# Patient Record
Sex: Male | Born: 1935
Health system: Southern US, Community
[De-identification: ages and names within clinical notes are randomized; demographics above are authoritative.]

## PROBLEM LIST (undated history)

## (undated) DIAGNOSIS — I219 Acute myocardial infarction, unspecified: Secondary | ICD-10-CM

## (undated) DIAGNOSIS — J189 Pneumonia, unspecified organism: Secondary | ICD-10-CM

## (undated) DIAGNOSIS — I10 Essential (primary) hypertension: Secondary | ICD-10-CM

## (undated) DIAGNOSIS — I499 Cardiac arrhythmia, unspecified: Secondary | ICD-10-CM

## (undated) DIAGNOSIS — H3530 Unspecified macular degeneration: Secondary | ICD-10-CM

## (undated) DIAGNOSIS — I2510 Atherosclerotic heart disease of native coronary artery without angina pectoris: Secondary | ICD-10-CM

## (undated) DIAGNOSIS — I509 Heart failure, unspecified: Secondary | ICD-10-CM

## (undated) DIAGNOSIS — H353232 Exudative age-related macular degeneration, bilateral, with inactive choroidal neovascularization: Secondary | ICD-10-CM

## (undated) DIAGNOSIS — R011 Cardiac murmur, unspecified: Secondary | ICD-10-CM

## (undated) DIAGNOSIS — E785 Hyperlipidemia, unspecified: Secondary | ICD-10-CM

## (undated) DIAGNOSIS — M1990 Unspecified osteoarthritis, unspecified site: Secondary | ICD-10-CM

## (undated) DIAGNOSIS — C801 Malignant (primary) neoplasm, unspecified: Secondary | ICD-10-CM

## (undated) DIAGNOSIS — G4733 Obstructive sleep apnea (adult) (pediatric): Secondary | ICD-10-CM

## (undated) HISTORY — DX: Cardiac murmur, unspecified: R01.1

## (undated) HISTORY — DX: Obstructive sleep apnea (adult) (pediatric): G47.33

## (undated) HISTORY — PX: PROSTATE BIOPSY: SHX241

## (undated) HISTORY — PX: CORONARY STENT INTERVENTION: CATH118234

## (undated) HISTORY — PX: OTHER SURGICAL HISTORY: SHX169

## (undated) HISTORY — DX: Unspecified macular degeneration: H35.30

## (undated) HISTORY — PX: HEMATOMA EVACUATION: SHX5118

## (undated) HISTORY — PX: HERNIA REPAIR: SHX51

## (undated) HISTORY — DX: Essential (primary) hypertension: I10

## (undated) HISTORY — PX: TONSILLECTOMY AND ADENOIDECTOMY: SUR1326

## (undated) HISTORY — DX: Hyperlipidemia, unspecified: E78.5

## (undated) HISTORY — DX: Cardiac arrhythmia, unspecified: I49.9

## (undated) HISTORY — DX: Heart failure, unspecified: I50.9

## (undated) HISTORY — DX: Atherosclerotic heart disease of native coronary artery without angina pectoris: I25.10

## (undated) HISTORY — PX: APPENDECTOMY: SHX54

---

## 2015-08-24 ENCOUNTER — Emergency Department (HOSPITAL_COMMUNITY): Payer: Medicare Other

## 2015-08-24 ENCOUNTER — Encounter (HOSPITAL_COMMUNITY): Payer: Self-pay | Admitting: Internal Medicine

## 2015-08-24 ENCOUNTER — Inpatient Hospital Stay (HOSPITAL_COMMUNITY)
Admission: EM | Admit: 2015-08-24 | Discharge: 2015-09-03 | DRG: 981 | Disposition: A | Discharge status: Home / Self Care | Payer: Medicare Other | Attending: Internal Medicine | Admitting: Internal Medicine

## 2015-08-24 DIAGNOSIS — R9431 Abnormal electrocardiogram [ECG] [EKG]: Secondary | ICD-10-CM | POA: Diagnosis not present

## 2015-08-24 DIAGNOSIS — R0902 Hypoxemia: Secondary | ICD-10-CM | POA: Insufficient documentation

## 2015-08-24 DIAGNOSIS — Z91010 Allergy to peanuts: Secondary | ICD-10-CM

## 2015-08-24 DIAGNOSIS — Z7982 Long term (current) use of aspirin: Secondary | ICD-10-CM

## 2015-08-24 DIAGNOSIS — I071 Rheumatic tricuspid insufficiency: Secondary | ICD-10-CM | POA: Diagnosis present

## 2015-08-24 DIAGNOSIS — Z888 Allergy status to other drugs, medicaments and biological substances status: Secondary | ICD-10-CM | POA: Diagnosis not present

## 2015-08-24 DIAGNOSIS — I4892 Unspecified atrial flutter: Secondary | ICD-10-CM | POA: Diagnosis present

## 2015-08-24 DIAGNOSIS — I447 Left bundle-branch block, unspecified: Secondary | ICD-10-CM | POA: Diagnosis present

## 2015-08-24 DIAGNOSIS — I272 Other secondary pulmonary hypertension: Secondary | ICD-10-CM | POA: Diagnosis present

## 2015-08-24 DIAGNOSIS — I491 Atrial premature depolarization: Secondary | ICD-10-CM | POA: Diagnosis present

## 2015-08-24 DIAGNOSIS — R7989 Other specified abnormal findings of blood chemistry: Secondary | ICD-10-CM

## 2015-08-24 DIAGNOSIS — R0600 Dyspnea, unspecified: Secondary | ICD-10-CM | POA: Diagnosis not present

## 2015-08-24 DIAGNOSIS — J13 Pneumonia due to Streptococcus pneumoniae: Principal | ICD-10-CM | POA: Diagnosis present

## 2015-08-24 DIAGNOSIS — Z7902 Long term (current) use of antithrombotics/antiplatelets: Secondary | ICD-10-CM

## 2015-08-24 DIAGNOSIS — I248 Other forms of acute ischemic heart disease: Secondary | ICD-10-CM | POA: Diagnosis not present

## 2015-08-24 DIAGNOSIS — J9601 Acute respiratory failure with hypoxia: Secondary | ICD-10-CM | POA: Diagnosis present

## 2015-08-24 DIAGNOSIS — I200 Unstable angina: Secondary | ICD-10-CM | POA: Diagnosis not present

## 2015-08-24 DIAGNOSIS — G4733 Obstructive sleep apnea (adult) (pediatric): Secondary | ICD-10-CM | POA: Diagnosis present

## 2015-08-24 DIAGNOSIS — J069 Acute upper respiratory infection, unspecified: Secondary | ICD-10-CM | POA: Diagnosis present

## 2015-08-24 DIAGNOSIS — I480 Paroxysmal atrial fibrillation: Secondary | ICD-10-CM | POA: Diagnosis present

## 2015-08-24 DIAGNOSIS — I429 Cardiomyopathy, unspecified: Secondary | ICD-10-CM | POA: Diagnosis present

## 2015-08-24 DIAGNOSIS — I499 Cardiac arrhythmia, unspecified: Secondary | ICD-10-CM | POA: Diagnosis not present

## 2015-08-24 DIAGNOSIS — Z7901 Long term (current) use of anticoagulants: Secondary | ICD-10-CM | POA: Diagnosis not present

## 2015-08-24 DIAGNOSIS — R778 Other specified abnormalities of plasma proteins: Secondary | ICD-10-CM

## 2015-08-24 DIAGNOSIS — I25110 Atherosclerotic heart disease of native coronary artery with unstable angina pectoris: Secondary | ICD-10-CM | POA: Diagnosis present

## 2015-08-24 DIAGNOSIS — I5041 Acute combined systolic (congestive) and diastolic (congestive) heart failure: Secondary | ICD-10-CM | POA: Diagnosis present

## 2015-08-24 DIAGNOSIS — Z882 Allergy status to sulfonamides status: Secondary | ICD-10-CM | POA: Diagnosis not present

## 2015-08-24 DIAGNOSIS — J189 Pneumonia, unspecified organism: Secondary | ICD-10-CM | POA: Diagnosis not present

## 2015-08-24 DIAGNOSIS — Z79899 Other long term (current) drug therapy: Secondary | ICD-10-CM | POA: Diagnosis not present

## 2015-08-24 DIAGNOSIS — I5032 Chronic diastolic (congestive) heart failure: Secondary | ICD-10-CM | POA: Diagnosis not present

## 2015-08-24 DIAGNOSIS — Z91018 Allergy to other foods: Secondary | ICD-10-CM | POA: Diagnosis not present

## 2015-08-24 DIAGNOSIS — I493 Ventricular premature depolarization: Secondary | ICD-10-CM | POA: Diagnosis not present

## 2015-08-24 DIAGNOSIS — E785 Hyperlipidemia, unspecified: Secondary | ICD-10-CM | POA: Diagnosis not present

## 2015-08-24 DIAGNOSIS — I471 Supraventricular tachycardia: Secondary | ICD-10-CM | POA: Diagnosis not present

## 2015-08-24 DIAGNOSIS — I252 Old myocardial infarction: Secondary | ICD-10-CM

## 2015-08-24 DIAGNOSIS — Z955 Presence of coronary angioplasty implant and graft: Secondary | ICD-10-CM | POA: Diagnosis not present

## 2015-08-24 DIAGNOSIS — E876 Hypokalemia: Secondary | ICD-10-CM | POA: Diagnosis not present

## 2015-08-24 DIAGNOSIS — I110 Hypertensive heart disease with heart failure: Secondary | ICD-10-CM | POA: Diagnosis present

## 2015-08-24 DIAGNOSIS — I214 Non-ST elevation (NSTEMI) myocardial infarction: Secondary | ICD-10-CM | POA: Diagnosis present

## 2015-08-24 DIAGNOSIS — R0602 Shortness of breath: Secondary | ICD-10-CM | POA: Diagnosis not present

## 2015-08-24 DIAGNOSIS — I2510 Atherosclerotic heart disease of native coronary artery without angina pectoris: Secondary | ICD-10-CM | POA: Diagnosis not present

## 2015-08-24 LAB — COMPREHENSIVE METABOLIC PANEL
ALT: 15 U/L — ABNORMAL LOW (ref 17–63)
AST: 26 U/L (ref 15–41)
Albumin: 3.5 g/dL (ref 3.5–5.0)
Alkaline Phosphatase: 35 U/L — ABNORMAL LOW (ref 38–126)
Anion gap: 10 (ref 5–15)
BILIRUBIN TOTAL: 1.7 mg/dL — AB (ref 0.3–1.2)
BUN: 25 mg/dL — AB (ref 6–20)
CO2: 24 mmol/L (ref 22–32)
Calcium: 9.6 mg/dL (ref 8.9–10.3)
Chloride: 103 mmol/L (ref 101–111)
Creatinine, Ser: 1.11 mg/dL (ref 0.61–1.24)
GFR calc Af Amer: >60 mL/min (ref >60)
Glucose, Bld: 147 mg/dL — ABNORMAL HIGH (ref 65–99)
POTASSIUM: 3.8 mmol/L (ref 3.5–5.1)
Sodium: 137 mmol/L (ref 135–145)
TOTAL PROTEIN: 6.7 g/dL (ref 6.5–8.1)

## 2015-08-24 LAB — BRAIN NATRIURETIC PEPTIDE: B NATRIURETIC PEPTIDE 5: 1070 pg/mL — AB (ref 0.0–100.0)

## 2015-08-24 LAB — CBC WITH DIFFERENTIAL/PLATELET
BASOS ABS: 0 10*3/uL (ref 0.0–0.1)
Basophils Relative: 0 %
EOS ABS: 0 10*3/uL (ref 0.0–0.7)
Eosinophils Relative: 0 %
HCT: 36.8 % — ABNORMAL LOW (ref 39.0–52.0)
Hemoglobin: 12.6 g/dL — ABNORMAL LOW (ref 13.0–17.0)
LYMPHS PCT: 5 %
Lymphs Abs: 0.7 10*3/uL (ref 0.7–4.0)
MCH: 31.8 pg (ref 26.0–34.0)
MCHC: 34.2 g/dL (ref 30.0–36.0)
MCV: 92.9 fL (ref 78.0–100.0)
Monocytes Absolute: 1.3 10*3/uL — ABNORMAL HIGH (ref 0.1–1.0)
Monocytes Relative: 8 %
Neutro Abs: 13.6 10*3/uL — ABNORMAL HIGH (ref 1.7–7.7)
Neutrophils Relative %: 87 %
PLATELETS: 402 10*3/uL — AB (ref 150–400)
RBC: 3.96 MIL/uL — AB (ref 4.22–5.81)
RDW: 12.8 % (ref 11.5–15.5)
WBC: 15.6 10*3/uL — AB (ref 4.0–10.5)

## 2015-08-24 LAB — I-STAT CG4 LACTIC ACID, ED: Lactic Acid, Venous: 1.78 mmol/L (ref 0.5–2.0)

## 2015-08-24 LAB — I-STAT TROPONIN, ED: TROPONIN I, POC: 1.06 ng/mL — AB (ref 0.00–0.08)

## 2015-08-24 MED ORDER — ACETAMINOPHEN 325 MG PO TABS
650.0000 mg | ORAL_TABLET | Freq: Once | ORAL | Status: AC
  Administered 2015-08-24: 650 mg via ORAL
  Filled 2015-08-24: qty 2

## 2015-08-24 MED ORDER — ACETAMINOPHEN 650 MG RE SUPP: 650.0000 mg | Freq: Four times a day (QID) | RECTAL | Status: DC | PRN

## 2015-08-24 MED ORDER — ASPIRIN EC 81 MG PO TBEC
81.0000 mg | DELAYED_RELEASE_TABLET | Freq: Every day | ORAL | Status: DC
  Administered 2015-08-25 – 2015-09-03 (×9): 81 mg via ORAL
  Filled 2015-08-24 (×10): qty 1

## 2015-08-24 MED ORDER — CEFTRIAXONE SODIUM 1 G IJ SOLR: 1.0000 g | INTRAMUSCULAR | Status: DC

## 2015-08-24 MED ORDER — SODIUM CHLORIDE 0.9 % IV BOLUS (SEPSIS)
500.0000 mL | Freq: Once | INTRAVENOUS | Status: AC
  Administered 2015-08-24: 500 mL via INTRAVENOUS

## 2015-08-24 MED ORDER — ACETAMINOPHEN 325 MG PO TABS: 650.0000 mg | ORAL_TABLET | Freq: Four times a day (QID) | ORAL | Status: DC | PRN

## 2015-08-24 MED ORDER — DEXTROSE 5 % IV SOLN
1.0000 g | INTRAVENOUS | Status: DC
  Administered 2015-08-24: 1 g via INTRAVENOUS
  Filled 2015-08-24: qty 10

## 2015-08-24 MED ORDER — DORZOLAMIDE HCL-TIMOLOL MAL 2-0.5 % OP SOLN
1.0000 [drp] | Freq: Two times a day (BID) | OPHTHALMIC | Status: DC
  Administered 2015-08-25 – 2015-09-02 (×18): 1 [drp] via OPHTHALMIC
  Filled 2015-08-24: qty 10

## 2015-08-24 MED ORDER — DEXTROSE 5 % IV SOLN
500.0000 mg | INTRAVENOUS | Status: DC
  Administered 2015-08-24: 500 mg via INTRAVENOUS
  Filled 2015-08-24: qty 500

## 2015-08-24 MED ORDER — SODIUM CHLORIDE 0.9 % IV BOLUS (SEPSIS)
1000.0000 mL | Freq: Once | INTRAVENOUS | Status: AC
  Administered 2015-08-24: 1000 mL via INTRAVENOUS

## 2015-08-24 MED ORDER — AZITHROMYCIN 500 MG PO TABS
500.0000 mg | ORAL_TABLET | ORAL | Status: DC
  Administered 2015-08-25 – 2015-08-28 (×4): 500 mg via ORAL
  Filled 2015-08-24 (×3): qty 2
  Filled 2015-08-24: qty 1

## 2015-08-24 MED ORDER — CARVEDILOL 12.5 MG PO TABS
12.5000 mg | ORAL_TABLET | Freq: Two times a day (BID) | ORAL | Status: DC
  Administered 2015-08-25 – 2015-08-30 (×11): 12.5 mg via ORAL
  Filled 2015-08-24 (×10): qty 1

## 2015-08-24 MED ORDER — ENOXAPARIN SODIUM 40 MG/0.4ML ~~LOC~~ SOLN: 40.0000 mg | SUBCUTANEOUS | Status: DC

## 2015-08-24 MED ORDER — DEXTROSE 5 % IV SOLN: 500.0000 mg | Freq: Once | INTRAVENOUS | Status: DC

## 2015-08-24 MED ORDER — DEXTROSE 5 % IV SOLN: 1.0000 g | Freq: Once | INTRAVENOUS | Status: DC

## 2015-08-24 MED ORDER — PRAVASTATIN SODIUM 40 MG PO TABS
40.0000 mg | ORAL_TABLET | Freq: Every day | ORAL | Status: DC
  Administered 2015-08-25 – 2015-08-30 (×6): 40 mg via ORAL
  Filled 2015-08-24 (×6): qty 1

## 2015-08-24 NOTE — ED Notes (Signed)
Pt placed back on non rebreather due to stats at 87% on 5L

## 2015-08-24 NOTE — ED Notes (Signed)
Pt taken off non rebreather and placed on 5L [place], stats at 93% pt tolerating well

## 2015-08-24 NOTE — ED Provider Notes (Signed)
CSN: [id number]     Arrival date & time 08/24/15  1855 History   First MD Initiated Contact with Patient 08/24/15 1909     Chief Complaint  Patient presents with  . Shortness of Breath     (Consider location/radiation/quality/duration/timing/severity/associated sxs/prior Treatment) HPI Comments: Patient is a 80 year old male with a cardiac history presenting with worsening shortness of breath, fever and cough. [place]. Saturday he started feeling ill which worsened on Sunday with fever up to 101 persisting today in addition to development of cough. The cough is nonproductive however patient does feel short of breath. Patient denies any lung history such as COPD or asthma. He is a nonsmoker. They were in the car for 4 hours [place] up from the beach but he denies any unilateral leg pain or swelling. Patient went to urgent care where he was found to have an oxygen saturation of 82% which only minimally improved with nasal cannula oxygen.  Patient denies any chest pain.  Patient is a 80 y.o. male presenting with shortness of breath. The history is provided by the patient and the spouse.  Shortness of Breath Severity:  Severe Onset quality:  Gradual Duration:  3 days Timing:  Constant Progression:  Worsening Chronicity:  New Context: URI   Relieved by:  Nothing Worsened by:  Nothing tried Ineffective treatments:  None tried Associated symptoms: cough, fever and wheezing   Associated symptoms: no abdominal pain, no chest pain and no vomiting   Risk factors: no hx of PE/DVT, no prolonged immobilization and no tobacco use     No past medical history on file. No past surgical history on file. No family history on file. Social History  Substance Use Topics  . Smoking status: Not on file  . Smokeless tobacco: Not on file  . Alcohol Use: Not on file    Review of Systems  Constitutional: Positive for fever.   Respiratory: Positive for cough, shortness of breath and wheezing.   Cardiovascular: Negative for chest pain.  Gastrointestinal: Negative for vomiting and abdominal pain.  All other systems reviewed and are negative.     Allergies  Review of patient's allergies indicates not on file.  Home Medications   Prior to Admission medications   Not on File   BP 144/70 mmHg  Pulse 88  Temp(Src) 99.2 F (37.3 C) (Oral)  Resp 20  Ht 5\' 9"  (1.753 m)  Wt 177 lb (80.287 kg)  BMI 26.13 kg/m2  SpO2 100% Physical Exam  Constitutional: He is oriented to person, place, and time. He appears well-developed and well-nourished. No distress.  HENT:  Head: Normocephalic and atraumatic.  Mouth/Throat: Oropharynx is clear and moist.  Eyes: Conjunctivae and EOM are normal. Pupils are equal, round, and reactive to light.  Neck: Normal range of motion. Neck supple.  Cardiovascular: Normal rate, regular rhythm and intact distal pulses.   No murmur heard. Pulmonary/Chest: Effort normal. Tachypnea noted. No respiratory distress. He has no wheezes. He has rhonchi. He has rales.  Abdominal: Soft. He exhibits no distension. There is no tenderness. There is no rebound and no guarding.  Musculoskeletal: Normal range of motion. He exhibits no edema or tenderness.  No lower extremity edema or tenderness  Neurological: He is alert and oriented to person, place, and time.  Skin: Skin is warm and dry. No rash noted. No erythema.  Psychiatric: He has a normal mood and affect. His behavior is  normal.  Nursing note and vitals reviewed.   ED Course  Procedures (including critical care time) Labs Review Labs Reviewed  CBC WITH DIFFERENTIAL/PLATELET - Abnormal; Notable for the following:    WBC 15.6 (*)    RBC 3.96 (*)    Hemoglobin 12.6 (*)    HCT 36.8 (*)    Platelets 402 (*)    Neutro Abs 13.6 (*)    Monocytes Absolute 1.3 (*)    All other components within normal limits  I-STAT TROPOININ, ED - Abnormal;  Notable for the following:    Troponin i, poc 1.06 (*)    All other components within normal limits  CULTURE, BLOOD (ROUTINE X 2)  CULTURE, BLOOD (ROUTINE X 2)  URINE CULTURE  COMPREHENSIVE METABOLIC PANEL  URINALYSIS, ROUTINE W REFLEX MICROSCOPIC (NOT AT [facility])  BRAIN NATRIURETIC PEPTIDE  I-STAT CG4 LACTIC ACID, ED    Imaging Review Dg Chest Port 1 View  08/24/2015  CLINICAL DATA:  Shortness of breath, hypoxia and fever. EXAM: PORTABLE CHEST 1 VIEW COMPARISON:  None. FINDINGS: The heart size and mediastinal contours are within normal limits. Dense infiltrates are seen in the right upper lobe and right lower lobe. There also is suspected to be a component of infiltrate in the left perihilar lung. Findings are consistent with bilateral pneumonia. There also may be mild superimposed interstitial edema without overt airspace edema or visible pleural effusions. No pneumothorax. The visualized skeletal structures are unremarkable. IMPRESSION: Evidence of bilateral pneumonia, right greater than left, and potentially superimposed mild interstitial edema. Electronically Signed   By: Irish Lack M.D.   On: 08/24/2015 19:58   I have personally reviewed and evaluated these images and lab results as part of my medical decision-making.   EKG Interpretation   Date/Time:  Monday Aug 24 2015 19:20:12 EDT Ventricular Rate:  87 PR Interval:  167 QRS Duration: 113 QT Interval:  380 QTC Calculation: 457 R Axis:   -9 Text Interpretation:  Sinus rhythm Probable left atrial enlargement  Incomplete left bundle branch block No previous tracing Confirmed by  Anitra Lauth  MD, Alphonzo Lemmings ([id number]) on 08/24/2015 7:22:59 PM      MDM   Final diagnoses:  CAP (community acquired pneumonia)  Hypoxia  Elevated troponin   Patient is a 80 year old male presenting today with fever, tachypnea, rhonchi and rales and hypoxia. Concern for pneumonia in code sepsis was initiated. Currently is mentating and on nonrebreather  oxygen saturation is 100%. He does have a known cardiac history but denies any chest pain today. He just has cardiologist 2 weeks ago and everything was normal.  Upon arrival on 6 L of oxygen patient was satting 89% and was changed to a nonrebreather with improvement. Temperature 90.9 0.201 at urgent care. Chest x-ray is consistent with bilateral pneumonia right greater than left and possible mild edema. Concern for the acute nature of patient's symptoms. He was started on antibiotics for community-acquired pneumonia.   Leukocytosis of 15,000, elevated troponin of 1. EKG with incomplete left bundle-branch block without old to compare. Renal function and lactic acid within normal limits.  Patient treated with Rocephin and azithromycin. Will be admitted to stepdown.   Gwyneth Sprout, MD 08/24/15 2116

## 2015-08-24 NOTE — H&P (Signed)
Date: 08/24/2015               Patient Name:  Ivan Mccoy MRN: [id number]  DOB: 12/31/1935 Age / Sex: 80 y.o., male   PCP: No primary care provider on file.         Medical Service: Internal Medicine Teaching Service         Attending Physician: Dr. Inez CatalinaEmily B Mullen, MD    First Contact: Dr. Karma GreaserBoswell Pager: [phone]  Second Contact: Dr. Tasia CatchingsAhmed Pager: [phone]       After Hours (After 5p/  First Contact Pager: [phone]  weekends / holidays): Second Contact Pager: [phone]   Chief Complaint: Shortness of breath  History of Present Illness: 80 y/o man with a history of HTN, paroxysmal Afib s/p cryoablation 2013, OSA presents to the ED after 3 days of worsening shortness of breath. This started Saturday with nausea and chills as the most prominent symptoms. This worsened and he has not tolerated eating or drinking much Sunday or today but denies any vomiting. His wife reports checking his temperature at home at 101.39F. He has also had a worsening nonproductive cough and today is persistently short of breath. He denies associated chest pain. He does not have other known sick contacts and has minimal upper airway symptoms. On arrival to the ED he was hypoxic that partially improved 90%s with O2 6L by [place], CXR was obtained showing bilateral R>L infiltrates and labs consistent with leukocytosis. IVNS, CTX, and azithromycin started.  [place] [facility] but was travelling for grandchildren's graduation. He was previously feeling at baseline health and saw his Cardiologist a month ago with no changes in treatment. He does not have any history of COPD or other lung disease that he is aware of. He has previously undergone echocardiography showing LVH and diastolic dysfunction. Previous stress tests done years ago were falsely positive twice with negative subsequent heart catheterizations.  Meds: Current  Facility-Administered Medications  Medication Dose Route Frequency Provider Last Rate Last Dose  . acetaminophen (TYLENOL) tablet 650 mg  650 mg Oral Q6H PRN Fuller Planhristopher W Idrees Quam, MD       Or  . acetaminophen (TYLENOL) suppository 650 mg  650 mg Rectal Q6H PRN Fuller Planhristopher W Morrisa Aldaba, MD      . Melene Muller[START ON 08/25/2015] aspirin EC tablet 81 mg  81 mg Oral Daily Fuller Planhristopher W Doneisha Ivey, MD      . Melene Muller[START ON 08/25/2015] azithromycin (ZITHROMAX) tablet 500 mg  500 mg Oral Q24H Fuller Planhristopher W Carrie Schoonmaker, MD      . Melene Muller[START ON 08/25/2015] carvedilol (COREG) tablet 12.5 mg  12.5 mg Oral BID WC Fuller Planhristopher W Akeel Reffner, MD      . Melene Muller[START ON 08/25/2015] cefTRIAXone (ROCEPHIN) 1 g in dextrose 5 % 50 mL IVPB  1 g Intravenous Q24H Fuller Planhristopher W Delroy Ordway, MD      . Melene Muller[START ON 08/25/2015] dorzolamide-timolol (COSOPT) 22.3-6.8 MG/ML ophthalmic solution 1 drop  1 drop Left Eye BID Fuller Planhristopher W Sedrick Tober, MD      . Melene Muller[START ON 08/25/2015] enoxaparin (LOVENOX) injection 40 mg  40 mg Subcutaneous Q24H Fuller Planhristopher W Liandra Mendia, MD      . Melene Muller[START ON 08/25/2015] pravastatin (PRAVACHOL) tablet 40 mg  40 mg Oral QHS Fuller Planhristopher W Mizraim Harmening, MD        Allergies: Allergies as of 08/24/2015 - Review Complete 08/24/2015  Allergen Reaction Noted  . Other Anaphylaxis 08/24/2015  . Peanut  oil Anaphylaxis 08/24/2015  . Peanut-containing drug products Anaphylaxis 08/24/2015  . Sulfa antibiotics Other (See Comments) 08/24/2015  . Eliquis [apixaban] Rash 08/24/2015  . Spironolactone Rash 08/24/2015   No past medical history on file. No past surgical history on file. Family History  Problem Relation Age of Onset  . Heart attack Father    Social History   Social History  . Marital Status: Married    Spouse Name: N/A  . Number of Children: N/A  . Years of Education: N/A   Occupational History  . Not on file.   Social History Main Topics  . Smoking status: Not on file  . Smokeless tobacco: Not on file  . Alcohol Use: Not on file  . Drug Use: Not on file  . Sexual  Activity: Not on file   Other Topics Concern  . Not on file   Social History Narrative  . No narrative on file    Review of Systems: Review of Systems  Constitutional: Positive for fever and chills.  HENT: Negative for congestion.   Eyes: Negative for blurred vision.  Respiratory: Positive for cough and shortness of breath. Negative for hemoptysis and sputum production.   Cardiovascular: Negative for chest pain.  Gastrointestinal: Positive for nausea. Negative for abdominal pain, diarrhea and constipation.  Genitourinary: Negative for dysuria.  Musculoskeletal: Negative for falls.  Skin: Negative for rash.  Neurological: Negative for dizziness.  Endo/Heme/Allergies: Does not bruise/bleed easily.  Psychiatric/Behavioral: The patient is not nervous/anxious.      Physical Exam: Blood pressure 101/67, pulse 85, temperature 98.6 F (37 C), temperature source Oral, resp. rate 25, height  (1.778 m), weight 82.2 kg (181 lb 3.5 oz), SpO2 94 %.   GENERAL- alert, co-operative, NAD HEENT- Oral mucosa appears moist, no cervical LN enlargement. CARDIAC- RRR, no murmurs, rubs or gallops. RESP- Crackles bilaterally extending up to apices and anteriorly, slightly diminished R base breath sounds, mildly increased WOB without retractions and speaking in short sentences ABDOMEN- Soft, nontender, no guarding or rebound NEURO- No obvious Cr N abnormality, strength upper and lower extremities- 5/5, Sensation intact globally EXTREMITIES- pulse 2+, symmetric, 1+ pedal edema b/l, chronic stasis changes over shins SKIN- Warm, dry, No rash or lesion. PSYCH- Normal mood and affect, appropriate thought content and speech.   Lab results: Basic Metabolic Panel:  Recent Labs  16/10/96 1941  NA 137  K 3.8  CL 103  CO2 24  GLUCOSE 147*  BUN 25*  CREATININE 1.11  CALCIUM 9.6   Liver Function Tests:  Recent Labs  08/24/15 1941  AST 26  ALT 15*  ALKPHOS 35*  BILITOT 1.7*  PROT 6.7    ALBUMIN 3.5   No results for input(s): LIPASE, AMYLASE in the last 72 hours. No results for input(s): AMMONIA in the last 72 hours. CBC:  Recent Labs  08/24/15 1941  WBC 15.6*  NEUTROABS 13.6*  HGB 12.6*  HCT 36.8*  MCV 92.9  PLT 402*   Cardiac Enzymes: No results for input(s): CKTOTAL, CKMB, CKMBINDEX, TROPONINI in the last 72 hours. BNP: No results for input(s): PROBNP in the last 72 hours. D-Dimer: No results for input(s): DDIMER in the last 72 hours. CBG: No results for input(s): GLUCAP in the last 72 hours. Hemoglobin A1C: No results for input(s): HGBA1C in the last 72 hours. Fasting Lipid Panel: No results for input(s): CHOL, HDL, LDLCALC, TRIG, CHOLHDL, LDLDIRECT in the last 72 hours. Thyroid Function Tests: No results for input(s): TSH, T4TOTAL, FREET4, T3FREE, THYROIDAB  in the last 72 hours. Anemia Panel: No results for input(s): VITAMINB12, FOLATE, FERRITIN, TIBC, IRON, RETICCTPCT in the last 72 hours. Coagulation: No results for input(s): LABPROT, INR in the last 72 hours. Urine Drug Screen: Drugs of Abuse  No results found for: LABOPIA, COCAINSCRNUR, LABBENZ, AMPHETMU, THCU, LABBARB  Alcohol Level: No results for input(s): ETH in the last 72 hours. Urinalysis: No results for input(s): COLORURINE, LABSPEC, PHURINE, GLUCOSEU, HGBUR, BILIRUBINUR, KETONESUR, PROTEINUR, UROBILINOGEN, NITRITE, LEUKOCYTESUR in the last 72 hours.  Invalid input(s): APPERANCEUR   Imaging results:  Dg Chest Port 1 View  08/24/2015  CLINICAL DATA:  Shortness of breath, hypoxia and fever. EXAM: PORTABLE CHEST 1 VIEW COMPARISON:  None. FINDINGS: The heart size and mediastinal contours are within normal limits. Dense infiltrates are seen in the right upper lobe and right lower lobe. There also is suspected to be a component of infiltrate in the left perihilar lung. Findings are consistent with bilateral pneumonia. There also may be mild superimposed interstitial edema without overt  airspace edema or visible pleural effusions. No pneumothorax. The visualized skeletal structures are unremarkable. IMPRESSION: Evidence of bilateral pneumonia, right greater than left, and potentially superimposed mild interstitial edema. Electronically Signed   By: Irish Lack M.D.   On: 08/24/2015 19:58    Other results: EKG: there are no previous tracings available for comparison, sinus rhythm, nonspecific ST and T waves changes, unclear conduction defect  Assessment & Plan by Problem: Community acquired pneumonia: R>L lobe infiltrates on CXR with fever, leukocytosis, worsening cough and SOB for 3 days. He has no known risk factors and no history of pulmonary disease. Denies any vomiting with his nausea so aspiration is not highly likely as a source. He is requiring substantial oxygen therapy 6L [place] or nonrebreather with increased WOB. -Admit to stepdown unit -Oxygen therapy as needed goal SpO2 >92% -Repeat AM Bmet, CBC -2view CXR in AM -Ceftriaxone 1g IV 5/15>>> -Azithromycin 500mg  5/15>>>  HFpEF: From review of cardi[facility] at [facility] appears diastolic dysfunction and LVH without significantly reduced EF. Previous stress studies consistently false positives with negative subsequent caths so likely NICM. However new troponin elevation and nonspecific EKG with his history is concerning. Also at risk for CHF with sepsis protocol IVF resuscitation. He is currently asymptomatic of any chest pain but dyspnea may be worsened with vascular congestion. -BNP -Trend trops -TTE in AM -Repeat EKG in AM -ASA 81mg  -Consider cardiology consult in AM -Continue home Coreg 12.5mg  BID -May increase from home lasix 20mg  if worsening edema and BP stable  HTN: On many agents PTA amlodipine 10mg , clonidine 0.1mg  TID, eplerenone 50mg , losartan 50mg . Will hold these initially for sepsis and fluid resuscitation but restart as indicated by BP.  HLD: Stable chronic problem. Continue home pravastatin  40mg , fenofibrate 160mg .  OSA: By chart and patient reports remarkably compliant at home. qHS CPAP.   FULL CODE Diet: Heart Healthy VTE ppx: [place] enoxaparin   Dispo: Disposition is deferred at this time, awaiting improvement of current medical problems. Anticipated discharge in approximately 2-4 day(s).   The patient does have a current PCP (No primary care provider on file.) and does not need an [facility] hospital follow-up appointment after discharge.  The patient does not have transportation limitations that hinder transportation to clinic appointments.  Signed: Fuller Plan, MD 08/24/2015, 11:48 PM

## 2015-08-24 NOTE — Progress Notes (Signed)
Pt arrived to 2C15 from ED at 2315.  Pt is al/o x4, NSR, on Venturi mask, no complaints of pain, afebrile, lots of coughing. RN will continue to monitor.

## 2015-08-24 NOTE — ED Notes (Signed)
Per  Dr. Anitra LauthPlunkett pt may take his normal home medications, that he brought with him except for him BP medications. Pt took his cholesterol and eye medication with this RN present, and withheld his BP medications.

## 2015-08-24 NOTE — ED Notes (Signed)
Pt reports generally nor feeling well for several days with nausea. Pt states that he has been chilled and running fevers of 101 at home. Pt was seen by his MD today and was noted to have spO2 in the 80's. Pt was placed on 6L here and sats increased to 88%.

## 2015-08-25 ENCOUNTER — Inpatient Hospital Stay (HOSPITAL_COMMUNITY): Payer: Medicare Other

## 2015-08-25 ENCOUNTER — Encounter (HOSPITAL_COMMUNITY): Payer: Self-pay | Admitting: *Deleted

## 2015-08-25 DIAGNOSIS — R0600 Dyspnea, unspecified: Secondary | ICD-10-CM

## 2015-08-25 DIAGNOSIS — R778 Other specified abnormalities of plasma proteins: Secondary | ICD-10-CM | POA: Insufficient documentation

## 2015-08-25 DIAGNOSIS — R7989 Other specified abnormal findings of blood chemistry: Secondary | ICD-10-CM

## 2015-08-25 DIAGNOSIS — R0902 Hypoxemia: Secondary | ICD-10-CM | POA: Insufficient documentation

## 2015-08-25 DIAGNOSIS — R0602 Shortness of breath: Secondary | ICD-10-CM

## 2015-08-25 LAB — DIFFERENTIAL
Basophils Absolute: 0 10*3/uL (ref 0.0–0.1)
Basophils Relative: 0 %
EOS ABS: 0 10*3/uL (ref 0.0–0.7)
EOS PCT: 0 %
LYMPHS ABS: 0.9 10*3/uL (ref 0.7–4.0)
LYMPHS PCT: 5 %
MONO ABS: 1.6 10*3/uL — AB (ref 0.1–1.0)
Monocytes Relative: 9 %
NEUTROS PCT: 86 %
Neutro Abs: 16.2 10*3/uL — ABNORMAL HIGH (ref 1.7–7.7)

## 2015-08-25 LAB — CBC
HEMATOCRIT: 38.8 % — AB (ref 39.0–52.0)
HEMOGLOBIN: 12.9 g/dL — AB (ref 13.0–17.0)
MCH: 31.1 pg (ref 26.0–34.0)
MCHC: 33.2 g/dL (ref 30.0–36.0)
MCV: 93.5 fL (ref 78.0–100.0)
Platelets: 394 10*3/uL (ref 150–400)
RBC: 4.15 MIL/uL — AB (ref 4.22–5.81)
RDW: 12.8 % (ref 11.5–15.5)
WBC: 17.8 10*3/uL — AB (ref 4.0–10.5)

## 2015-08-25 LAB — ECHOCARDIOGRAM COMPLETE
Height: 70 in
WEIGHTICAEL: 2899.49 [oz_av]

## 2015-08-25 LAB — BASIC METABOLIC PANEL
ANION GAP: 12 (ref 5–15)
BUN: 24 mg/dL — AB (ref 6–20)
CHLORIDE: 106 mmol/L (ref 101–111)
CO2: 23 mmol/L (ref 22–32)
Calcium: 9 mg/dL (ref 8.9–10.3)
Creatinine, Ser: 1.1 mg/dL (ref 0.61–1.24)
Glucose, Bld: 161 mg/dL — ABNORMAL HIGH (ref 65–99)
POTASSIUM: 3.7 mmol/L (ref 3.5–5.1)
SODIUM: 141 mmol/L (ref 135–145)

## 2015-08-25 LAB — URINE MICROSCOPIC-ADD ON
RBC / HPF: NONE SEEN RBC/hpf (ref 0–5)
WBC, UA: NONE SEEN WBC/hpf (ref 0–5)

## 2015-08-25 LAB — URINALYSIS, ROUTINE W REFLEX MICROSCOPIC
BILIRUBIN URINE: NEGATIVE
Glucose, UA: NEGATIVE mg/dL
Hgb urine dipstick: NEGATIVE
Ketones, ur: NEGATIVE mg/dL
Leukocytes, UA: NEGATIVE
NITRITE: NEGATIVE
PH: 5.5 (ref 5.0–8.0)
Protein, ur: 30 mg/dL — AB
SPECIFIC GRAVITY, URINE: 1.029 (ref 1.005–1.030)

## 2015-08-25 LAB — MRSA PCR SCREENING: MRSA by PCR: NEGATIVE

## 2015-08-25 LAB — TROPONIN I
TROPONIN I: 0.9 ng/mL — AB (ref <0.031)
TROPONIN I: 0.77 ng/mL — AB (ref <0.031)
Troponin I: 1.15 ng/mL (ref <0.031)

## 2015-08-25 LAB — HEPARIN LEVEL (UNFRACTIONATED)

## 2015-08-25 LAB — PROCALCITONIN: Procalcitonin: 0.45 ng/mL

## 2015-08-25 LAB — EXPECTORATED SPUTUM ASSESSMENT W REFEX TO RESP CULTURE: SPECIAL REQUESTS: NORMAL

## 2015-08-25 LAB — HIV ANTIBODY (ROUTINE TESTING W REFLEX): HIV Screen 4th Generation wRfx: NONREACTIVE

## 2015-08-25 LAB — STREP PNEUMONIAE URINARY ANTIGEN: STREP PNEUMO URINARY ANTIGEN: NEGATIVE

## 2015-08-25 IMAGING — DX DG CHEST 2V
2 series · 2 of 2 positions shown · non-contrast
Comparison: [DATE]

CLINICAL DATA: Shortness of breath since yesterday. Community
acquired pneumonia.

EXAM:
CHEST  2 VIEW

[x chest ap]
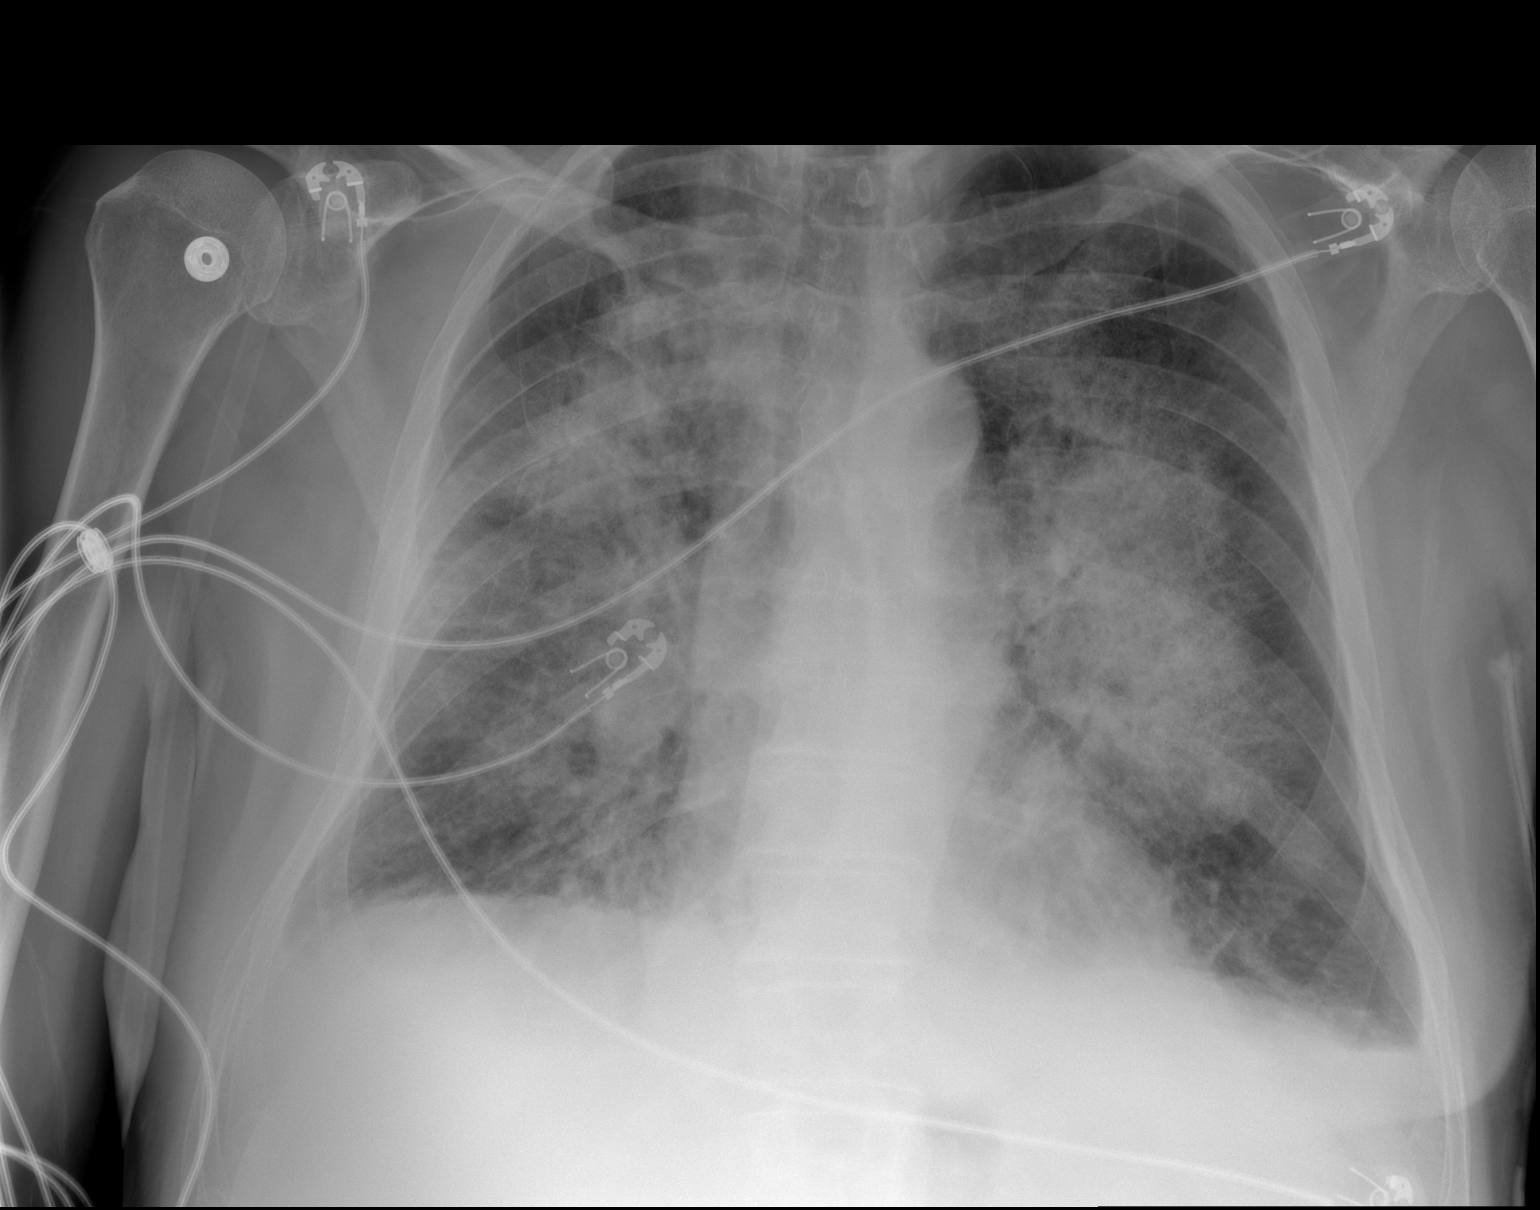

[w chest lat]
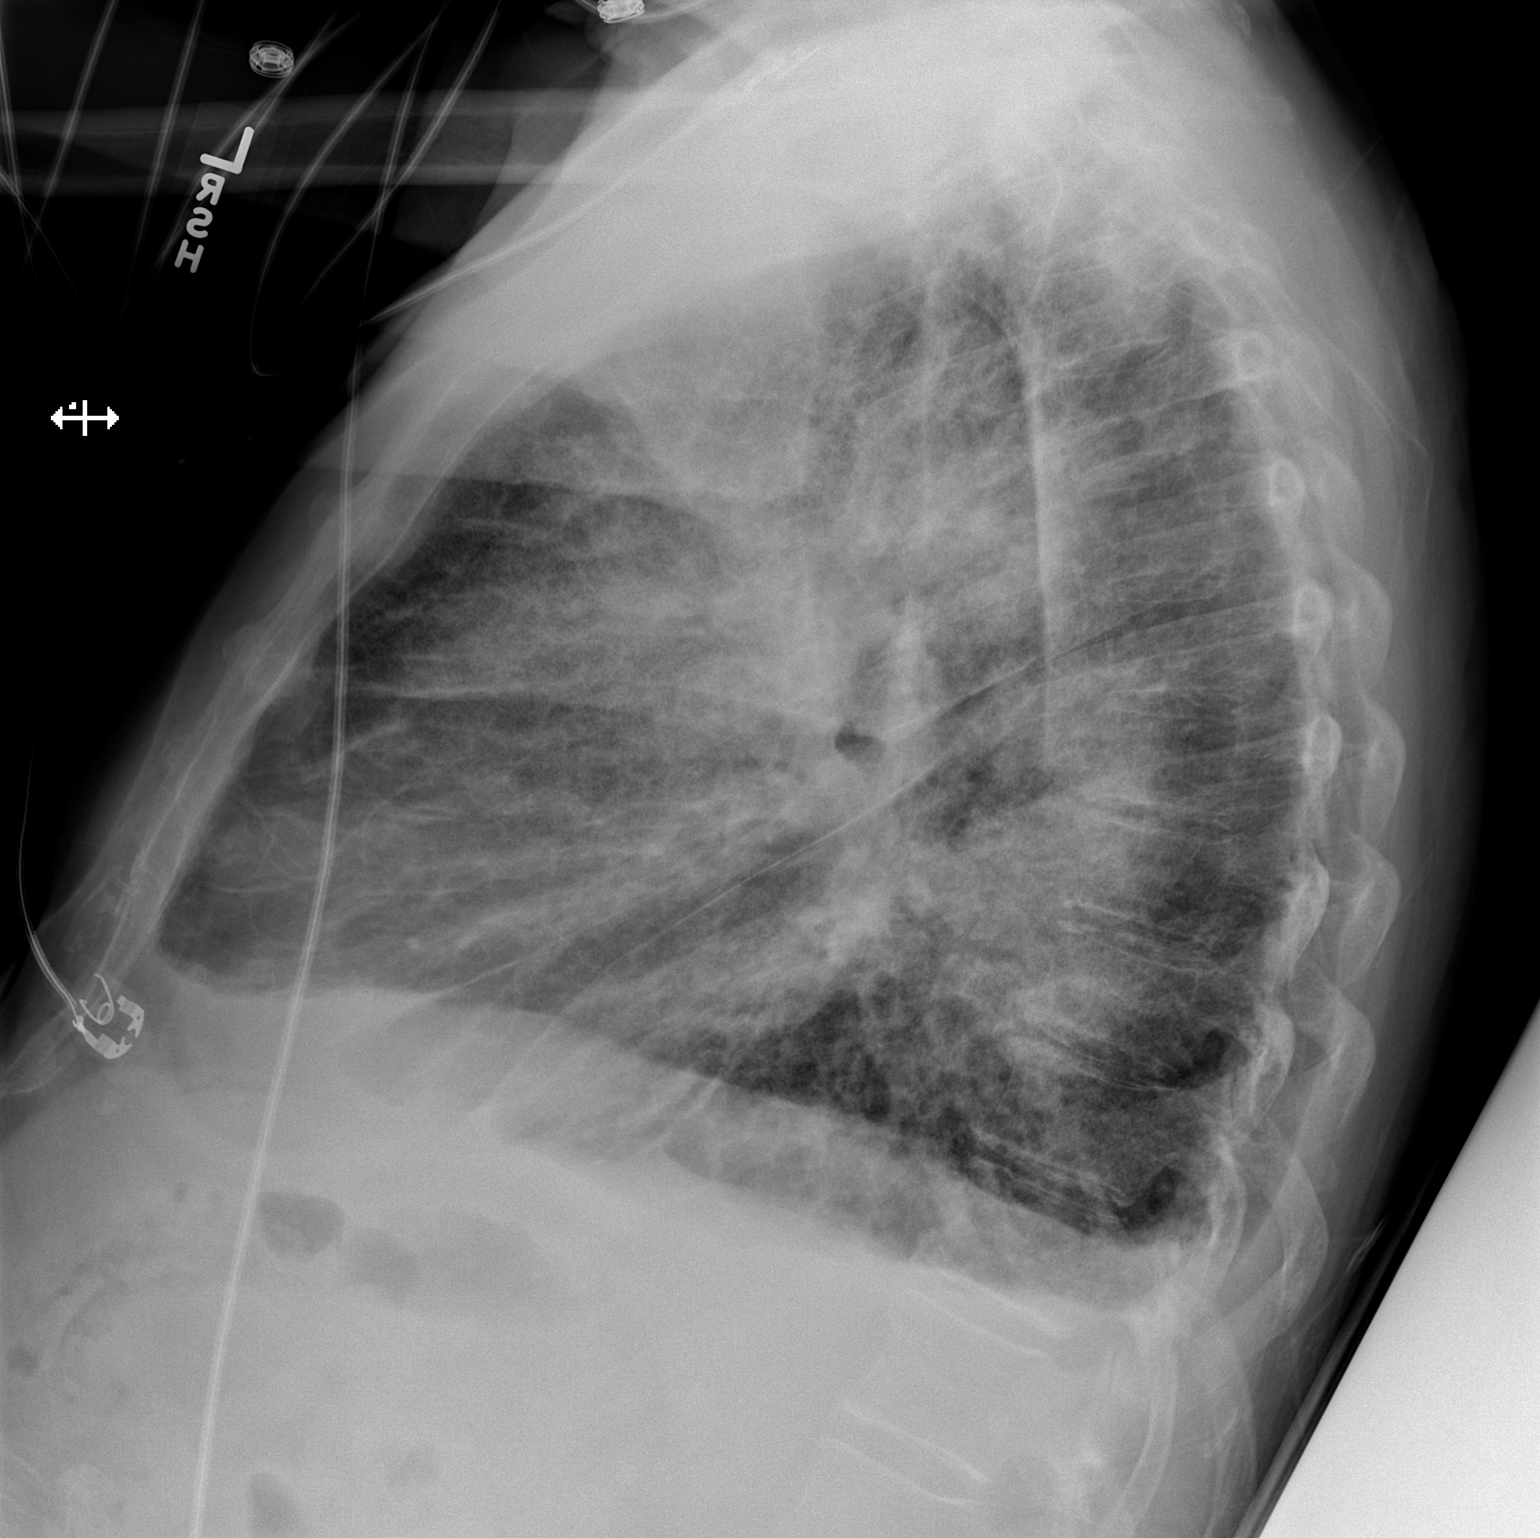

[2 of 2 positions shown; findings below may reference images not displayed]

FINDINGS: Allowing for lower lung volumes on the current exam, bilateral,
centrally predominant, airspace opacities are similar to the
previous day's exam. There are small pleural effusions. No
pneumothorax.

Cardiac silhouette is normal in size.
IMPRESSION: 1. Bilateral airspace opacities with small pleural effusions similar
to the previous day's exam. Findings are consistent with either
bilateral pneumonia or pulmonary edema.

## 2015-08-25 MED ORDER — HEPARIN BOLUS VIA INFUSION
4000.0000 [IU] | Freq: Once | INTRAVENOUS | Status: AC
  Administered 2015-08-25: 4000 [IU] via INTRAVENOUS
  Filled 2015-08-25: qty 4000

## 2015-08-25 MED ORDER — HEPARIN (PORCINE) IN NACL 100-0.45 UNIT/ML-% IJ SOLN
1450.0000 [IU]/h | INTRAMUSCULAR | Status: DC
  Administered 2015-08-25 (×2): 1000 [IU]/h via INTRAVENOUS
  Administered 2015-08-26: 1250 [IU]/h via INTRAVENOUS
  Filled 2015-08-25 (×2): qty 250

## 2015-08-25 MED ORDER — FUROSEMIDE 10 MG/ML IJ SOLN
80.0000 mg | Freq: Once | INTRAMUSCULAR | Status: AC
  Administered 2015-08-25: 80 mg via INTRAVENOUS
  Filled 2015-08-25: qty 8

## 2015-08-25 MED ORDER — TRAZODONE HCL 50 MG PO TABS
50.0000 mg | ORAL_TABLET | Freq: Every evening | ORAL | Status: DC | PRN
  Administered 2015-08-28: 50 mg via ORAL
  Filled 2015-08-25: qty 1

## 2015-08-25 MED ORDER — CEFTRIAXONE SODIUM 1 G IJ SOLR
1.0000 g | Freq: Once | INTRAMUSCULAR | Status: AC
  Administered 2015-08-25: 1 g via INTRAVENOUS
  Filled 2015-08-25: qty 10

## 2015-08-25 MED ORDER — HEPARIN BOLUS VIA INFUSION
2000.0000 [IU] | Freq: Once | INTRAVENOUS | Status: AC
  Administered 2015-08-25: 2000 [IU] via INTRAVENOUS
  Filled 2015-08-25: qty 2000

## 2015-08-25 MED ORDER — FUROSEMIDE 10 MG/ML IJ SOLN
40.0000 mg | Freq: Two times a day (BID) | INTRAMUSCULAR | Status: DC
  Administered 2015-08-25: 40 mg via INTRAVENOUS
  Filled 2015-08-25: qty 4

## 2015-08-25 MED ORDER — IPRATROPIUM-ALBUTEROL 0.5-2.5 (3) MG/3ML IN SOLN
3.0000 mL | Freq: Three times a day (TID) | RESPIRATORY_TRACT | Status: DC
  Administered 2015-08-25 – 2015-08-28 (×12): 3 mL via RESPIRATORY_TRACT
  Filled 2015-08-25 (×11): qty 3

## 2015-08-25 MED ORDER — DM-GUAIFENESIN ER 30-600 MG PO TB12
1.0000 | ORAL_TABLET | Freq: Two times a day (BID) | ORAL | Status: DC
  Administered 2015-08-25 – 2015-09-02 (×18): 1 via ORAL
  Filled 2015-08-25 (×7): qty 1
  Filled 2015-08-25: qty 2
  Filled 2015-08-25 (×10): qty 1

## 2015-08-25 MED ORDER — FUROSEMIDE 10 MG/ML IJ SOLN
40.0000 mg | Freq: Three times a day (TID) | INTRAMUSCULAR | Status: DC
  Administered 2015-08-25 (×2): 40 mg via INTRAVENOUS
  Filled 2015-08-25 (×2): qty 4

## 2015-08-25 MED ORDER — DEXTROSE 5 % IV SOLN
2.0000 g | INTRAVENOUS | Status: AC
  Administered 2015-08-25 – 2015-08-30 (×6): 2 g via INTRAVENOUS
  Filled 2015-08-25 (×7): qty 2

## 2015-08-25 MED ORDER — ZOLPIDEM TARTRATE 5 MG PO TABS
5.0000 mg | ORAL_TABLET | Freq: Once | ORAL | Status: DC
  Filled 2015-08-25: qty 1

## 2015-08-25 NOTE — Progress Notes (Signed)
CRITICAL VALUE ALERT  Critical value received:  Troponin 1.15  Date of notification:  08/25/15  Time of notification:  0405  Critical value read back: Yes  Nurse who received alert:  Rosamaria LintsKelsey Yari Szeliga, RN  MD notified (1st page):  Dr. Dimple Caseyice, IMTS  Time of first page:  0410  Responding MD:  Dr. Dimple Caseyice, Danise EdgeIMTS  Time MD responded:  [phone]

## 2015-08-25 NOTE — Progress Notes (Signed)
Subjective: No events overnight. Patient reports that he was unable to sleep last night due to worsening shortness of breath when lying back. He says he had to lean forward in a tripod position to help with his breathing overnight. Reports cough with scant sputum production. Denies any chest pain, palpations. Does report nausea with coughing but no emesis.   Says he saw his cardiologist a month ago and was told he had no problems and to follow up in a year. Also reports seeing his PCP recently with lab work and had no problems.   Objective: Vital signs in last 24 hours: Filed Vitals:   08/25/15 0852 08/25/15 0957 08/25/15 1221 08/25/15 1343  BP:  136/66 107/83   Pulse:  87 84   Temp:   98.5 F (36.9 C)   TempSrc:   Oral   Resp:  20 18   Height:      Weight:      SpO2: 96% 95% 96% 100%   Weight change:   Intake/Output Summary (Last 24 hours) at 08/25/15 1613 Last data filed at 08/25/15 1138  Gross per 24 hour  Intake   2800 ml  Output   1480 ml  Net   1320 ml    General: alert, well-developed, and cooperative to examination.  Neck: supple, full ROM, no carotid bruits. Patient unable to lie flat to assess for JVD. Lungs: normal respiratory effort, no accessory muscle use, crackles to mid lung fields, no wheezing Heart: normal rate, regular rhythm, no murmur, no gallop, and no rub.  Abdomen: soft, non-tender, normal bowel sounds, no distentio Pulses: 2+ DP/PT pulses bilaterally Extremities: No cyanosis, clubbing, 1+ pedal edema bilaterally Neurologic: alert & oriented X3, no focal deficits Skin:dry skin  Psych: normal mood and affect   Medications: I have reviewed the patient's current medications. Scheduled Meds: . aspirin EC  81 mg Oral Daily  . azithromycin  500 mg Oral Q24H  . carvedilol  12.5 mg Oral BID WC  . cefTRIAXone (ROCEPHIN)  IV  2 g Intravenous Q24H  . dextromethorphan-guaiFENesin  1 tablet Oral BID  . dorzolamide-timolol  1 drop Left Eye BID  .  furosemide  40 mg Intravenous TID  . ipratropium-albuterol  3 mL Nebulization TID  . pravastatin  40 mg Oral QHS  . zolpidem  5 mg Oral Once   Continuous Infusions: . heparin 1,000 Units/hr (08/25/15 1004)   PRN Meds:.acetaminophen **OR** acetaminophen Assessment/Plan: Acute SOB: CAP vs Acute CHF. R>L lobe infiltrates on CXR with fever, leukocytosis, worsening cough and SOB for 3 days. He has no known risk factors and no history of pulmonary disease. Denies any vomiting with his nausea so aspiration is not highly likely as a source. He is requiring substantial oxygen therapy 6L [place] on non-rebreather. No increased work of breathing this morning. His respiratory symptoms were worse after receiving 3L in the ED. Suspect possibly viral pneumonia given the bilateral infiltrates and week prodrome prior to admission though this could be secondary to silent MI with elevated troponin and possible new EKG changes. PCT was equivocal. Will continue to treat for CAP. Troponin peaked at 1.15. Will get repeat EKG today and try to obtain records from PCP/Cardiologist.  -Oxygen therapy as needed goal SpO2 >92%, currently on NRB -Repeat AM Bmet, CBC -2view CXR in AM -Ceftriaxone 1g IV 5/15>>> -Azithromycin  5/15>>> -Repeat EKG -Cardiology consulted, appreciate the consult -ECHO pending -Lasix 40 mg IV TID -Nebs tid -Mucinex -heparin gtt   HFpEF: [facility]  [facility] appears diastolic dysfunction and LVH without significantly reduced EF. Previous stress studies consistently false positives with negative subsequent caths so likely NICM. However new troponin elevation and nonspecific EKG with his history is concerning. Also at risk for CHF with sepsis protocol IVF resuscitation. He is currently asymptomatic of any chest pain but dyspnea may be worsened with vascular congestion. BNP elevated at 1100. Troponin peaked at 1.15 overnight.  -TTE pending -Repeat EKG -ASA  81mg  -cardiology consulted, appreciate the consult -Continue home Coreg 12.5mg  BID -Lasix 40 mg IV TID  HTN: On many agents PTA amlodipine 10mg , clonidine 0.1mg  TID, eplerenone 50mg , losartan 50mg . Will hold these. BP has been labile 107-148 systolic.   HLD: Stable chronic problem. - Continue home pravastatin 40mg , fenofibrate 160mg .  OSA:. qHS CPAP.  FULL CODE  Diet: Heart Healthy  VTE ppx: [place] enoxaparin  Dispo: Disposition is deferred at this time, awaiting improvement of current medical problems.    The patient does have a current PCP (No primary care provider on file.) and [facility] after discharge.  The patient does not have transportation limitations that hinder transportation to clinic appointments.  .Services Needed at time of discharge: Y = Yes, Blank = No PT:   OT:   RN:   Equipment:   Other:     LOS: 1 day   Ivan NoseNathan Tamsen Reist, MD IMTS PGY-1 [phone] 08/25/2015, 4:13 PM

## 2015-08-25 NOTE — Progress Notes (Signed)
Subjective: [name] was seen and examined during morning rounds.  He complains of shortness of breath that gets better when leaning over and is improved with oxygen.  He also complains of a productive cough, no hemoptysis.  Continues to have nausea associated with coughing spells.  Symptoms are not much improved from yesterday.  Denies CP, dizziness, vomiting.  He states he had a "complete heart and blood work" workup last month at [facility] and was told there were no issues with his heart.   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  Wife will work on getting that EKG faxed to us.     Objective: Vital signs in last 24 hours: Filed Vitals:   08/25/15 0500 08/25/15 0600 08/25/15 0847 08/25/15 0852  BP: 130/58 148/68    Pulse: 89 90 94   Temp:   97.9 F (36.6 C)   TempSrc:   Axillary   Resp: 17 24 20    Height:      Weight:      SpO2: 96% 99% 96% 96%   Weight change:   Intake/Output Summary (Last 24 hours) at 08/25/15 1026 Last data filed at 08/25/15 1022  Gross per 24 hour  Intake   2800 ml  Output   1280 ml  Net   1520 ml   General: Awake, conversant.  O2 mas in place.  Lungs: Faint crackles bilaterally.  No egophony.  Heart: RRR.  No murmurs.  No JVD  Abdomen, soft, nontender  Extremities: Warm, dry.   Lab Results: CBC    Component Value Date/Time   WBC 17.8* 08/25/2015 0250   RBC 4.15* 08/25/2015 0250   HGB 12.9* 08/25/2015 0250   HCT 38.8* 08/25/2015 0250   PLT 394 08/25/2015 0250   MCV 93.5 08/25/2015 0250   MCH 31.1 08/25/2015 0250   MCHC 33.2 08/25/2015 0250   RDW 12.8 08/25/2015 0250   LYMPHSABS 0.7 08/24/2015 1941   MONOABS 1.3* 08/24/2015 1941   EOSABS 0.0 08/24/2015 1941   BASOSABS 0.0 08/24/2015 1941    BMP Latest Ref Rng 08/25/2015 08/24/2015  Glucose 65 - 99 mg/dL 409(W161(H) 119(J147(H)  BUN 6 - 20 mg/dL 47(W24(H) 29(F25(H)  Creatinine 0.61 - 1.24 mg/dL 6.211.10 3.081.11  Sodium 657135 - 145  mmol/L 141 137  Potassium 3.5 - 5.1 mmol/L 3.7 3.8  Chloride 101 - 111 mmol/L 106 103  CO2 22 - 32 mmol/L 23 24  Calcium 8.9 - 10.3 mg/dL 9.0 9.6   Troponin: 8.461.06, 1.15, 0.9  BNP: 1070 (high)  UA: hyaline casts  S. pneumo urine antigen: negative Procalcitonin: 0.45 (normal)   Micro Results: Recent Results (from the past 240 hour(s))  MRSA PCR Screening     Status: None   Collection Time: 08/24/15 11:28 PM  Result Value Ref Range Status   MRSA by PCR NEGATIVE NEGATIVE Final    Comment:        The GeneXpert MRSA Assay (FDA approved for NASAL specimens only), is one component of a comprehensive MRSA colonization surveillance program. It is not intended to diagnose MRSA infection nor to guide or monitor treatment for MRSA infections.    Studies/Results: X-ray Chest Pa And Lateral  08/25/2015  CLINICAL DATA:  Shortness of breath since yesterday. Community acquired pneumonia. EXAM: CHEST  2 VIEW COMPARISON:  08/24/2015 FINDINGS: Allowing for lower lung volumes on the current exam, bilateral, centrally predominant, airspace opacities are similar to  the previous day's exam. There are small pleural effusions. No pneumothorax. Cardiac silhouette is normal in size. IMPRESSION: 1. Bilateral airspace opacities with small pleural effusions similar to the previous day's exam. Findings are consistent with either bilateral pneumonia or pulmonary edema. Electronically Signed   By: Amie Portland M.D.   On: 08/25/2015 08:07   Dg Chest Port 1 View  08/24/2015  CLINICAL DATA:  Shortness of breath, hypoxia and fever. EXAM: PORTABLE CHEST 1 VIEW COMPARISON:  None. FINDINGS: The heart size and mediastinal contours are within normal limits. Dense infiltrates are seen in the right upper lobe and right lower lobe. There also is suspected to be a component of infiltrate in the left perihilar lung. Findings are consistent with bilateral pneumonia. There also may be mild superimposed interstitial edema without  overt airspace edema or visible pleural effusions. No pneumothorax. The visualized skeletal structures are unremarkable. IMPRESSION: Evidence of bilateral pneumonia, right greater than left, and potentially superimposed mild interstitial edema. Electronically Signed   By: Irish Lack M.D.   On: 08/24/2015 19:58   Medications: I have reviewed the patient's current medications. Scheduled Meds: . aspirin EC  81 mg Oral Daily  . azithromycin  500 mg Oral Q24H  . carvedilol  12.5 mg Oral BID WC  . cefTRIAXone (ROCEPHIN)  IV  2 g Intravenous Q24H  . dextromethorphan-guaiFENesin  1 tablet Oral BID  . dorzolamide-timolol  1 drop Left Eye BID  . furosemide  40 mg Intravenous BID  . ipratropium-albuterol  3 mL Nebulization TID  . pravastatin  40 mg Oral QHS  . zolpidem  5 mg Oral Once   Continuous Infusions: . heparin 1,000 Units/hr (08/25/15 1004)   PRN Meds:.acetaminophen **OR** acetaminophen   Assessment/Plan: Shortness of breath Likely CAP given prodrome of nausea, vomiting, and progressive cough.  Also has leukocytosis and CXR shows bilateral infiltrates.  However, also concerned that pt is post-MI given his history of diastolic heart dysfunction and chest feeling "funny" last week.  Troponins are elevated, which may also support a post-MI diagnosis.  His last cath was over 20 years ago and was normal.  May also have pulmonary congestion from IVF load received in ED or first presentation of CHF.  Will consult cardiology regarding symptoms to differentiate CAP vs. Heart problem.  Given concern for possible MI, will start on heparin drip.     -Cardiology consulted  -Heparin drip at 71mL/hr -Oxygen therapy as needed goal SpO2 >92%.  Currently on 6L -Repeat EKG  -Trend troponin -ECHO today  -Ceftriaxone 2g IV qday -Azithromycin  qday -Mucinex bid -Nebulizer tid   HFpEF: Denies any chest pain at this time. Will work up as explained above: -Cards consult -Trend troponin -Repeat  EKG -ECHO today  -ASA  -Heparin drip at 52mL/hr   Hypertension: Restart home meds.  BPs have been in the 130s, but currently 148/68.  -Coreg 12.5 mg bid  -Lasix  bid   Hyperlipidemia -pravastatin  qday   This is a Psychologist, occupational Note.  The care of the patient was discussed with Dr. Valentino Nose and the assessment and plan formulated with their assistance.  Please see their attached note for official documentation of the daily encounter.    LOS: 1 day   Nathaneil Canary, Med Student 08/25/2015, 10:26 AM

## 2015-08-25 NOTE — Progress Notes (Signed)
ANTICOAGULATION CONSULT NOTE   Pharmacy Consult for Heparin Indication: chest pain/ACS  Allergies  Allergen Reactions  . Other Anaphylaxis    Hummus NO GARBANZO BEANS!!!  . Peanut Oil Anaphylaxis    deadly  . Peanut-Containing Drug Products Anaphylaxis  . Sulfa Antibiotics Other (See Comments)    PATIENT WAS (PERHAPS?) ALLERGIC TO THIS CLASS OF MEDS AS A TEENAGER  . Eliquis [Apixaban] Rash  . Spironolactone Rash    Patient Measurements: Height: 5\' 10"  (177.8 cm) Weight: 181 lb 3.5 oz (82.2 kg) IBW/kg (Calculated) : 73  Vital Signs: Temp: 98.9 F (37.2 C) (05/16 2005) Temp Source: Oral (05/16 2005) BP: 125/62 mmHg (05/16 2005) Pulse Rate: 80 (05/16 2005)  Labs:  Recent Labs  08/24/15 1941 08/25/15 0250 08/25/15 0306 08/25/15 0746 08/25/15 1525 08/25/15 1815  HGB 12.6* 12.9*  --   --   --   --   HCT 36.8* 38.8*  --   --   --   --   PLT 402* 394  --   --   --   --   HEPARINUNFRC  --   --   --   --   --  <0.10*  CREATININE 1.11 1.10  --   --   --   --   TROPONINI  --   --  1.15* 0.90* 0.77*  --     Estimated Creatinine Clearance: 58.1 mL/min (by C-G formula based on Cr of 1.1).     Assessment: 80 y.o. male with SOB/HF exacerabtion, elevated cardiac markers, on heparin at 1000 units/hr and the initial heparin level is undetectable.   Goal of Therapy:  Heparin level 0.3-0.7 units/ml Monitor platelets by anticoagulation protocol: Yes   Plan:   -Heparin 2000 units IV bolus, then increase heparin to 1250 units/hr -Heparin level in 8 hours and daily wth CBC daily  Harland Germanndrew Isaid Salvia, Pharm D 08/25/2015 8:32 PM

## 2015-08-25 NOTE — Progress Notes (Signed)
  Echocardiogram 2D Echocardiogram has been performed.  Cathie BeamsGREGORY, Adie Vilar 08/25/2015, 12:25 PM

## 2015-08-25 NOTE — Progress Notes (Signed)
ANTICOAGULATION CONSULT NOTE - Initial Consult  Pharmacy Consult for Heparin Indication: chest pain/ACS  Allergies  Allergen Reactions  . Other Anaphylaxis    Hummus NO GARBANZO BEANS!!!  . Peanut Oil Anaphylaxis    deadly  . Peanut-Containing Drug Products Anaphylaxis  . Sulfa Antibiotics Other (See Comments)    PATIENT WAS (PERHAPS?) ALLERGIC TO THIS CLASS OF MEDS AS A TEENAGER  . Eliquis [Apixaban] Rash  . Spironolactone Rash    Patient Measurements: Height:  (177.8 cm) Weight: 181 lb 3.5 oz (82.2 kg) IBW/kg (Calculated) : 73  Vital Signs: Temp: 98.9 F (37.2 C) (05/16 0421) Temp Source: Oral (05/16 0421) BP: 148/68 mmHg (05/16 0600) Pulse Rate: 90 (05/16 0600)  Labs:  Recent Labs  08/24/15 1941 08/25/15 0250 08/25/15 0306  HGB 12.6* 12.9*  --   HCT 36.8* 38.8*  --   PLT 402* 394  --   CREATININE 1.11 1.10  --   TROPONINI  --   --  1.15*    Estimated Creatinine Clearance: 58.1 mL/min (by C-G formula based on Cr of 1.1).   Medical History: Htn  Afib  Medications:  Prescriptions prior to admission  Medication Sig Dispense Refill Last Dose  . amLODipine (NORVASC) 10 MG tablet Take 10 mg by mouth at bedtime.   08/23/2015 at pm  . aspirin EC 81 MG tablet Take 81 mg by mouth at bedtime.   08/24/2015 at am  . carvedilol (COREG) 12.5 MG tablet Take 12.5 mg by mouth 2 (two) times daily with a meal.   08/24/2015 at am  . cloNIDine (CATAPRES) 0.1 MG tablet Take 0.1 mg by mouth 3 (three) times daily.   08/24/2015 at 1500  . Coenzyme Q10 (CO Q 10) 100 MG CAPS Take 1 capsule by mouth daily.   08/24/2015 at am  . dorzolamide-timolol (COSOPT) 22.3-6.8 MG/ML ophthalmic solution Instill 1 drop into the left eye two times daily  0 08/24/2015 at am  . EPINEPHrine (EPIPEN 2-PAK) 0.3 mg/0.3 mL IJ SOAJ injection Inject 0.3 mg into the muscle once. AS NEEDED FOR ANAPHYLAXIS     . eplerenone (INSPRA) 50 MG tablet Take 50 mg by mouth daily.   08/24/2015 at am  . fenofibrate 160  MG tablet Take 160 mg by mouth daily.   08/24/2015 at am  . furosemide (LASIX) 20 MG tablet Take 20 mg by mouth daily.   08/24/2015 at am  . GLUCOSAMINE HCL PO Take 1 capsule by mouth 3 (three) times daily.   08/24/2015 at am  . losartan (COZAAR) 50 MG tablet Take 50 mg by mouth 2 (two) times daily.   08/24/2015 at am  . magnesium oxide (MAG-OX) 400 MG tablet Take 400 mg by mouth daily.   08/24/2015 at qm  . metroNIDAZOLE (METROCREAM) 0.75 % cream Apply 1 application topically every morning. APPLY TO FACE   Past Week at Unknown time  . Multiple Vitamins-Minerals (CENTRUM SILVER ADULT 50+ PO) Take 1 tablet by mouth daily with breakfast.   08/24/2015 at am  . Multiple Vitamins-Minerals (PRESERVISION AREDS 2) CAPS Take 1 capsule by mouth 2 (two) times daily.   08/24/2015 at pm  . Omega-3 Fatty Acids (OMEGA-3 FISH OIL PO) Take 1 capsule by mouth 2 (two) times daily.   08/24/2015 at am  . pravastatin (PRAVACHOL) 40 MG tablet Take 40 mg by mouth at bedtime.   08/23/2015 at 1900  . Probiotic Product (PROBIOTIC DAILY) CAPS Take 1 capsule by mouth at bedtime.   08/24/2015 at  am  . Psyllium (METAMUCIL FIBER PO) Take 1 capsule by mouth every morning.   08/24/2015 at am  . saw palmetto 160 MG capsule Take 160 mg by mouth daily.   08/24/2015 at am    Assessment: 80 y.o. male with SOB/HF exacerabtion, elevated cardiac markers, for heparin  Goal of Therapy:  Heparin level 0.3-0.7 units/ml Monitor platelets by anticoagulation protocol: Yes   Plan:  Heparin 4000 units IV bolus, then start heparin 1000 units/hr Check heparin level in 8 hours.   Osker Ayoub, Gary FleetGregory Vernon 08/25/2015,7:52 AM

## 2015-08-26 DIAGNOSIS — R9431 Abnormal electrocardiogram [ECG] [EKG]: Secondary | ICD-10-CM

## 2015-08-26 DIAGNOSIS — R7989 Other specified abnormal findings of blood chemistry: Secondary | ICD-10-CM

## 2015-08-26 DIAGNOSIS — I493 Ventricular premature depolarization: Secondary | ICD-10-CM

## 2015-08-26 DIAGNOSIS — J189 Pneumonia, unspecified organism: Secondary | ICD-10-CM

## 2015-08-26 DIAGNOSIS — I480 Paroxysmal atrial fibrillation: Secondary | ICD-10-CM

## 2015-08-26 DIAGNOSIS — I491 Atrial premature depolarization: Secondary | ICD-10-CM

## 2015-08-26 DIAGNOSIS — I499 Cardiac arrhythmia, unspecified: Secondary | ICD-10-CM

## 2015-08-26 LAB — CBC
HCT: 36.1 % — ABNORMAL LOW (ref 39.0–52.0)
Hemoglobin: 12.5 g/dL — ABNORMAL LOW (ref 13.0–17.0)
MCH: 32 pg (ref 26.0–34.0)
MCHC: 34.6 g/dL (ref 30.0–36.0)
MCV: 92.3 fL (ref 78.0–100.0)
PLATELETS: 459 10*3/uL — AB (ref 150–400)
RBC: 3.91 MIL/uL — ABNORMAL LOW (ref 4.22–5.81)
RDW: 12.7 % (ref 11.5–15.5)
WBC: 12.5 10*3/uL — ABNORMAL HIGH (ref 4.0–10.5)

## 2015-08-26 LAB — BASIC METABOLIC PANEL
Anion gap: 15 (ref 5–15)
BUN: 31 mg/dL — AB (ref 6–20)
CALCIUM: 9.8 mg/dL (ref 8.9–10.3)
CO2: 27 mmol/L (ref 22–32)
Chloride: 100 mmol/L — ABNORMAL LOW (ref 101–111)
Creatinine, Ser: 1.1 mg/dL (ref 0.61–1.24)
GFR calc Af Amer: >60 mL/min (ref >60)
GLUCOSE: 156 mg/dL — AB (ref 65–99)
Potassium: 3.7 mmol/L (ref 3.5–5.1)
Sodium: 142 mmol/L (ref 135–145)

## 2015-08-26 LAB — URINE CULTURE: CULTURE: NO GROWTH

## 2015-08-26 LAB — HEPARIN LEVEL (UNFRACTIONATED)
HEPARIN UNFRACTIONATED: 0.2 [IU]/mL — AB (ref 0.30–0.70)
Heparin Unfractionated: 0.21 IU/mL — ABNORMAL LOW (ref 0.30–0.70)

## 2015-08-26 MED ORDER — FUROSEMIDE 10 MG/ML IJ SOLN
40.0000 mg | Freq: Two times a day (BID) | INTRAMUSCULAR | Status: DC
  Administered 2015-08-26 (×2): 40 mg via INTRAVENOUS
  Filled 2015-08-26 (×3): qty 4

## 2015-08-26 MED ORDER — HEPARIN (PORCINE) IN NACL 100-0.45 UNIT/ML-% IJ SOLN
1850.0000 [IU]/h | INTRAMUSCULAR | Status: DC
  Administered 2015-08-26 (×2): 1600 [IU]/h via INTRAVENOUS
  Filled 2015-08-26 (×2): qty 250

## 2015-08-26 MED ORDER — GUAIFENESIN-DM 100-10 MG/5ML PO SYRP
5.0000 mL | ORAL_SOLUTION | ORAL | Status: DC | PRN
  Administered 2015-08-29: 5 mL via ORAL
  Filled 2015-08-26: qty 5

## 2015-08-26 MED ORDER — HEPARIN BOLUS VIA INFUSION
2000.0000 [IU] | Freq: Once | INTRAVENOUS | Status: AC
  Administered 2015-08-26: 2000 [IU] via INTRAVENOUS
  Filled 2015-08-26: qty 2000

## 2015-08-26 NOTE — Progress Notes (Signed)
Pharmacist Student Provided - Patient Medication Education Education Best boyof B1 [facility] Team Teaching Service Patient  Education: Educated the patient on some of current medications, specifically, heparin and ceftriaxone. Reviewed side effects with the patient. Answered the patients medication specific questions. These medications are on the patient's current med list and are subject to change upon discharge.  Dola Argyleamika Guerino Caporale  P4 PharmD Candidate

## 2015-08-26 NOTE — Progress Notes (Signed)
  Date: 08/26/2015  Patient name: Marsa ArisCharles Silas  Medical record number: [id number]  Date of birth: 05/09/1935   This patient has been seen and the plan of care was discussed with the house staff. Please see Dr. Bonney RousselBoswell's note for complete details. I concur with his findings.    We reviewed telemetry today with the team.  Also had a new EKG which showed PVCs, PACs, irregular rhythm.  Unclear cause.  Possible subacute MI, vs new heart block vs another type of arrhythmia.  Pwaves are present.  Cardiology input today.   Inez CatalinaEmily B Mullen, MD 08/26/2015, 1:20 PM

## 2015-08-26 NOTE — Progress Notes (Signed)
Subjective: No events overnight. Patient reports much better rest overnight than prior. Does complain of worsening cough with associated pleuritic chest pain today. Denies any sputum production. Reports his breathing is improving and he is slowing coming down on O2 requirements. Did sat to the 80s with worsening tachycardia when he removed the O2 to speak with me today. He denies any chest pain, palpations, abdominal pain, nausea or vomiting. He is able to eat and drink well.   [place] before symptoms of shortness of breath began. Denies any leg swelling apart from mild baseline edema and denies any leg pain.   Wife (ex-nurse) reports his HR has been faster than usual and more irregular. He has a history of A. FIb s/p ablation. Review of telemetry shows frequent PACs and PVCs. While in the room he became tachycardic to the 120-130s when he removed his oxygen with desats to the 80s. Telemetry showed P-waves with possible U-waves occasionally but difficult to determine.   Objective: Vital signs in last 24 hours: Filed Vitals:   08/26/15 0311 08/26/15 0731 08/26/15 0819 08/26/15 1115  BP: 133/75 142/62  148/59  Pulse: 73 83  85  Temp: 97.2 F (36.2 C) 98.4 F (36.9 C)  98 F (36.7 C)  TempSrc: Axillary Axillary  Oral  Resp: 21 18  23   Height:      Weight:      SpO2: 93% 96% 98% 92%   Weight change:   Intake/Output Summary (Last 24 hours) at 08/26/15 1229 Last data filed at 08/26/15 1117  Gross per 24 hour  Intake 847.63 ml  Output   1026 ml  Net -178.37 ml    General: alert, well-developed, and cooperative to examination.  Neck: supple, full ROM, no carotid bruits. No JVD appreciated.  Lungs: normal respiratory effort, no accessory muscle use, no crackles or wheezes.  Heart: tachycardic, regular rhythm with occasional irregular beat, no murmur, no gallop, and no rub.  Abdomen: soft, non-tender, normal bowel sounds, no  distentio Pulses: 2+ DP/PT pulses bilaterally Extremities: No cyanosis, clubbing, 1+ pitting edema bilaterally to shins Psych: normal mood and affect   Medications: I have reviewed the patient's current medications. Scheduled Meds: . aspirin EC  81 mg Oral Daily  . azithromycin  500 mg Oral Q24H  . carvedilol  12.5 mg Oral BID WC  . cefTRIAXone (ROCEPHIN)  IV  2 g Intravenous Q24H  . dextromethorphan-guaiFENesin  1 tablet Oral BID  . dorzolamide-timolol  1 drop Left Eye BID  . furosemide  40 mg Intravenous Q12H  . ipratropium-albuterol  3 mL Nebulization TID  . pravastatin  40 mg Oral QHS  . zolpidem  5 mg Oral Once   Continuous Infusions: . heparin     PRN Meds:.acetaminophen **OR** acetaminophen, guaiFENesin-dextromethorphan, traZODone Assessment/Plan: CAP: Cardiac work up yesterday unremarkable, do not suspect he had an occult MI that preceded his hospitalization. Troponin peaked at 1.15 and [facility] it appears the T-wave inversions from EKG 2 weeks ago have resolved. ECHO done yesterday was a poor quality study but showed normal LV function (unable to determine EF), with mildly elevated pulmonary artery pressures at 49. Clinical picture inconsistent with PE. Respiratory status is improving with ABX and he is requiring less O2 today. WBC is trending down, remains afebrile. Appears to be responding well to the ABX.  He has a history of A. FIb s/p ablation. Review of telemetry shows frequent PACs and  PVCs. While in the room he became tachycardic to the 120-130s when he removed his oxygen with desats to the 80s. Telemetry showed P-waves with possible U-waves occasionally but difficult to determine. Will recheck EKG today and consult cardiology.  -Oxygen therapy as needed goal SpO2 >92% -Repeat AM Bmet, CBC -Ceftriaxone 1g IV 5/15>>> -Azithromycin  5/15>>> -Repeat EKG -Cardiology consulted, appreciate the consult -Lasix 40 mg IV TID > decrease to  BID -Continue heparin gtt pending cardiology consult, low suspicion for a. Fib as p-waves present and ACS ruled out at this time. Will d/c if okay with cardiology.  -Robitussin DM prn -Tylenol prn -F/U BCx > NGTD -F/U Sputum Cx > pending  Tachycardia: History of A. Fib s/p ablation. Review of telemetry only reveals PAC and PVCs. While in the room telemetry showed P-waves with possible U-waves occasionally but difficult to determine. Will repeat EKG today. Possible that given acute illness patient has thrown patient back in to a. Fib though could make out p-waves on telemetry. Possible MAT. Will consult cardiology today for further recommendations. -EKG -Appreciate cardiology help -continue Heparin gtt  HFpEF: From review of [facility] at [facility] appears diastolic dysfunction and LVH without significantly reduced EF. Previous stress studies consistently false positives with negative subsequent caths so likely NICM. ECHO done yesterday with no LV dysfunction but unable to determine EF. Unable to determine if diastolic dysfunction present. PAP mildly elevated at 49.  -Repeat EKG -ASA  -cardiology consulted, appreciate the consult -Continue home Coreg 12.5mg  BID -Lasix 40 mg IV TID > decrease to BID  HTN: On many agents PTA amlodipine , clonidine 0.1mg  TID, eplerenone , losartan . Will hold these. BP mildly elevated 140-150s systolic.  -Consider restarting home meds if BP remains elevated -Continue Coreg  HLD: Stable chronic problem. - Continue home pravastatin , fenofibrate .  OSA:. qHS CPAP.  FULL CODE  Diet: Heart Healthy  VTE ppx: [place] enoxaparin  Dispo: Disposition is deferred at this time, awaiting improvement of current medical problems.    The patient does have a current PCP (No primary care provider on file.) and does need an [facility] hospital follow-up appointment after discharge.  The patient does not have transportation limitations that  hinder transportation to clinic appointments.  .Services Needed at time of discharge: Y = Yes, Blank = No PT:   OT:   RN:   Equipment:   Other:     LOS: 2 days   Valentino Nose, MD IMTS PGY-1 [phone] 08/26/2015, 12:29 PM

## 2015-08-26 NOTE — Progress Notes (Signed)
ANTICOAGULATION CONSULT NOTE   Pharmacy Consult for Heparin Indication: chest pain/ACS  Allergies  Allergen Reactions  . Other Anaphylaxis    Hummus NO GARBANZO BEANS!!!  . Peanut Oil Anaphylaxis    deadly  . Peanut-Containing Drug Products Anaphylaxis  . Sulfa Antibiotics Other (See Comments)    PATIENT WAS (PERHAPS?) ALLERGIC TO THIS CLASS OF MEDS AS A TEENAGER  . Eliquis [Apixaban] Rash  . Spironolactone Rash    Patient Measurements: Height: 5\' 10"  (177.8 cm) Weight: 181 lb 3.5 oz (82.2 kg) IBW/kg (Calculated) : 73  Vital Signs: Temp: 98 F (36.7 C) (05/17 1115) Temp Source: Oral (05/17 1115) BP: 148/59 mmHg (05/17 1115) Pulse Rate: 85 (05/17 1115)  Labs:  Recent Labs  08/24/15 1941 08/25/15 0250 08/25/15 0306 08/25/15 0746 08/25/15 1525 08/25/15 1815 08/26/15 0530 08/26/15 1053 08/26/15 1416  HGB 12.6* 12.9*  --   --   --   --  12.5*  --   --   HCT 36.8* 38.8*  --   --   --   --  36.1*  --   --   PLT 402* 394  --   --   --   --  459*  --   --   HEPARINUNFRC  --   --   --   --   --  <0.10* 0.20*  --  0.21*  CREATININE 1.11 1.10  --   --   --   --   --  1.10  --   TROPONINI  --   --  1.15* 0.90* 0.77*  --   --   --   --     Estimated Creatinine Clearance: 58.1 mL/min (by C-G formula based on Cr of 1.1).  Assessment: 80 y.o. male with SOB/HF exacerabtion, elevated troponins who continues on heparin.  Level is still low this afternoon at 0.21 units/mL. Spoke with RN- no issues with line, has been running at 1450 units/hr all day. No significant bleeding noted- RN did tell me he had some flecks of blood in his sputum.  Goal of Therapy:  Heparin level 0.3-0.7 units/ml Monitor platelets by anticoagulation protocol: Yes   Plan:  -Heparin 2000 units IV bolus, then increase heparin 1600 units/hr -Check heparin level in 8 hours at midnight  -Daily HL and CBC   Virl Coble D. Tana Trefry, PharmD, BCPS Clinical Pharmacist Pager: [phone] 08/26/2015 4:00 PM

## 2015-08-26 NOTE — Consult Note (Signed)
Patient ID: Ivan Mccoy MRN: [id number], DOB/AGE: [birth year]   Admit date: 08/24/2015   Provider Requesting Consult: Dr. Valentino Nose, Internal Medicine   Primary Physician:Dr. Mamie Nick Primary Cardiologist: Dr. Early Chars,  [facility]  Pt. Profile:  80 year old male from [place] with past cardiac history significant for paroxysmal atrial fibrillation, followed by Dr. Early Chars of [facility], who presented to [facility] on 08/24/15 with complaint of shortness of breath, cough, subjective fever and chills. He is being treated by Internal Medicine for community-acquired pneumonia. Cardiology consult and for concerns for recurrent atrial fibrillation and elevated troponins.  Problem List  History reviewed. No pertinent past medical history.  History reviewed. No pertinent past surgical history.   Allergies  Allergies  Allergen Reactions  . Other Anaphylaxis    Hummus NO GARBANZO BEANS!!!  . Peanut Oil Anaphylaxis    deadly  . Peanut-Containing Drug Products Anaphylaxis  . Sulfa Antibiotics Other (See Comments)    PATIENT WAS (PERHAPS?) ALLERGIC TO THIS CLASS OF MEDS AS A TEENAGER  . Eliquis [Apixaban] Rash  . Spironolactone Rash    HPI  80 year old male from [place] with past cardiac history significant for paroxysmal atrial fibrillation, followed by Dr. Early Chars of [facility], who presented to [facility] on 08/24/15 with complaint of shortness of breath, cough, subjective fever and chills. He is being treated by Internal Medicine for community-acquired pneumonia. Cardiology consulted given concerns for possible recurrent atrial fibrillation, chest pain and elevated troponin levels.  His PMH including cardiac records from [place] are available in Care Everywhere. As outlined above, he has a history of atrial fibrillation status post ablation in September 2013. He is not on  anticoagulation therapy. He was on Pradaxa in the past. He also has a history of sleep apnea and is compliant with CPAP therapy. Additional past medical history is significant for hypertension and hyperlipidemia. He had a Cardiolite exercise tolerance test 05/06/2011 which was negative for ischemia. Echocardiogram also in January 2013 showed normal left ventricular systolic function with an estimated ejection fraction of 50-55%. There was moderate left ventricular hypertrophy with diastolic dysfunction. The left atrium and right atrium were mildly enlarged. Moderate tricuspid regurgitation and mild pulmonary hypertension was noted at that time.  His last cardiology follow-up was recent on 08/04/2015. At that time, he was noted to be doing well, without any recurrent atrial fibrillation. EKG at that time showed normal sinus rhythm with controlled ventricular rate of 60 bpm. He was instructed to follow-up in one year. He is on BB therapy with Coreg as an outpatient.   The patient is in [place] visiting family. As outlined above, he presented with complaints of dyspnea, cough, subjective fever and chills. His CXR showed evidence of bilateral pneumonia and potentially superimposed mild interstitial edema. BNP was abnormal at 1070. White count was also elevated at 15.6. He is being treated for community-acquired pneumonia with antibiotics, azithromycin and Rocephin. 2-D echocardiogram was obtained 04/27/15 which showed normal left ventricular systolic function. No wall motion abnormalities. Mild AI and mild MR are both noted. He is also receiving lasix, 40 mg IV BID.   Cardiology has been consulted regarding concerns for recurrent atrial fibrillation. Troponins have also been elevated, peaking at 1.15. This is now showing a downward drift from 1.15>>0.90>0.77.   Patient denies any recent CP. He does recall an episode of nausea, fatigue and malaise around May 2, leading to a visit to the fire  department for an  EKG. This showed NSR with TWIs in leads V5, V6. He was not seen by a provider at that time.     Home Medications  Prior to Admission medications   Medication Sig Start Date End Date Taking? Authorizing Provider  amLODipine (NORVASC) 10 MG tablet Take 10 mg by mouth at bedtime.   Yes Historical Provider, MD  aspirin EC 81 MG tablet Take 81 mg by mouth at bedtime.   Yes Historical Provider, MD  carvedilol (COREG) 12.5 MG tablet Take 12.5 mg by mouth 2 (two) times daily with a meal.   Yes Historical Provider, MD  cloNIDine (CATAPRES) 0.1 MG tablet Take 0.1 mg by mouth 3 (three) times daily.   Yes Historical Provider, MD  Coenzyme Q10 (CO Q 10) 100 MG CAPS Take 1 capsule by mouth daily.   Yes Historical Provider, MD  dorzolamide-timolol (COSOPT) 22.3-6.8 MG/ML ophthalmic solution Instill 1 drop into the left eye two times daily 08/12/15  Yes Historical Provider, MD  EPINEPHrine (EPIPEN 2-PAK) 0.3 mg/0.3 mL IJ SOAJ injection Inject 0.3 mg into the muscle once. AS NEEDED FOR ANAPHYLAXIS   Yes Historical Provider, MD  eplerenone (INSPRA) 50 MG tablet Take 50 mg by mouth daily.   Yes Historical Provider, MD  fenofibrate 160 MG tablet Take 160 mg by mouth daily.   Yes Historical Provider, MD  furosemide (LASIX) 20 MG tablet Take 20 mg by mouth daily.   Yes Historical Provider, MD  GLUCOSAMINE HCL PO Take 1 capsule by mouth 3 (three) times daily.   Yes Historical Provider, MD  losartan (COZAAR) 50 MG tablet Take 50 mg by mouth 2 (two) times daily.   Yes Historical Provider, MD  magnesium oxide (MAG-OX) 400 MG tablet Take 400 mg by mouth daily.   Yes Historical Provider, MD  metroNIDAZOLE (METROCREAM) 0.75 % cream Apply 1 application topically every morning. APPLY TO FACE   Yes Historical Provider, MD  Multiple Vitamins-Minerals (CENTRUM SILVER ADULT 50+ PO) Take 1 tablet by mouth daily with breakfast.   Yes Historical Provider, MD  Multiple Vitamins-Minerals (PRESERVISION AREDS 2) CAPS Take 1 capsule by  mouth 2 (two) times daily.   Yes Historical Provider, MD  Omega-3 Fatty Acids (OMEGA-3 FISH OIL PO) Take 1 capsule by mouth 2 (two) times daily.   Yes Historical Provider, MD  pravastatin (PRAVACHOL) 40 MG tablet Take 40 mg by mouth at bedtime.   Yes Historical Provider, MD  Probiotic Product (PROBIOTIC DAILY) CAPS Take 1 capsule by mouth at bedtime.   Yes Historical Provider, MD  Psyllium (METAMUCIL FIBER PO) Take 1 capsule by mouth every morning.   Yes Historical Provider, MD  saw palmetto 160 MG capsule Take 160 mg by mouth daily.   Yes Historical Provider, MD    Family History  Family History  Problem Relation Age of Onset  . Heart attack Father     Social History  Social History   Social History  . Marital Status: Married    Spouse Name: N/A  . Number of Children: N/A  . Years of Education: N/A   Occupational History  . Not on file.   Social History Main Topics  . Smoking status: Never Smoker   . Smokeless tobacco: Not on file  . Alcohol Use: Not on file  . Drug Use: Not on file  . Sexual Activity: Not on file   Other Topics Concern  . Not on file   Social History Narrative     Review  of Systems General:  No chills, fever, night sweats or weight changes.  Cardiovascular:  No chest pain, dyspnea on exertion, edema, orthopnea, palpitations, paroxysmal nocturnal dyspnea. Dermatological: No rash, lesions/masses Respiratory: No cough, dyspnea Urologic: No hematuria, dysuria Abdominal:   No nausea, vomiting, diarrhea, bright red blood per rectum, melena, or hematemesis Neurologic:  No visual changes, wkns, changes in mental status. All other systems reviewed and are otherwise negative except as noted above.  Physical Exam  Blood pressure 148/59, pulse 85, temperature 98 F (36.7 C), temperature source Oral, resp. rate 23, height 5\' 10"  (1.778 m), weight 181 lb 3.5 oz (82.2 kg), SpO2 92 %.  General: Pleasant, NAD Psych: Normal affect. Neuro: Alert and oriented X  3. Moves all extremities spontaneously. HEENT: Normal  Neck: Supple without bruits or JVD. Lungs:  Resp regular and unlabored, wheezing in the RUL Heart: irregularly irregular rhythm, tachy rate no s3, s4, or murmurs. Abdomen: Soft, non-tender, non-distended, BS + x 4.  Extremities: No clubbing, cyanosis. DP/PT/Radials 2+ and equal bilaterally. Trace bilateral LEE  Labs  Troponin [facility](Point of Care Test)  Recent Labs  08/24/15 1943  TROPIPOC 1.06*    Recent Labs  08/25/15 0306 08/25/15 0746 08/25/15 1525  TROPONINI 1.15* 0.90* 0.77*   Lab Results  Component Value Date   WBC 12.5* 08/26/2015   HGB 12.5* 08/26/2015   HCT 36.1* 08/26/2015   MCV 92.3 08/26/2015   PLT 459* 08/26/2015    Recent Labs Lab 08/24/15 1941  08/26/15 1053  NA 137  < > 142  K 3.8  < > 3.7  CL 103  < > 100*  CO2 24  < > 27  BUN 25*  < > 31*  CREATININE 1.11  < > 1.10  CALCIUM 9.6  < > 9.8  PROT 6.7  --   --   BILITOT 1.7*  --   --   ALKPHOS 35*  --   --   ALT 15*  --   --   AST 26  --   --   GLUCOSE 147*  < > 156*  < > = values in this interval not displayed. No results found for: CHOL, HDL, LDLCALC, TRIG No results found for: DDIMER   Radiology/Studies  X-ray Chest Pa And Lateral  08/25/2015  CLINICAL DATA:  Shortness of breath since yesterday. Community acquired pneumonia. EXAM: CHEST  2 VIEW COMPARISON:  08/24/2015 FINDINGS: Allowing for lower lung volumes on the current exam, bilateral, centrally predominant, airspace opacities are similar to the previous day's exam. There are small pleural effusions. No pneumothorax. Cardiac silhouette is normal in size. IMPRESSION: 1. Bilateral airspace opacities with small pleural effusions similar to the previous day's exam. Findings are consistent with either bilateral pneumonia or pulmonary edema. Electronically Signed   By: Amie Portlandavid  Ormond M.D.   On: 08/25/2015 08:07   Dg Chest Port 1 View  08/24/2015  CLINICAL DATA:  Shortness of breath, hypoxia and  fever. EXAM: PORTABLE CHEST 1 VIEW COMPARISON:  None. FINDINGS: The heart size and mediastinal contours are within normal limits. Dense infiltrates are seen in the right upper lobe and right lower lobe. There also is suspected to be a component of infiltrate in the left perihilar lung. Findings are consistent with bilateral pneumonia. There also may be mild superimposed interstitial edema without overt airspace edema or visible pleural effusions. No pneumothorax. The visualized skeletal structures are unremarkable. IMPRESSION: Evidence of bilateral pneumonia, right greater than left, and potentially superimposed mild interstitial edema. Electronically  Signed   By: Glenn  YamIrish Lack On: 08/24/2015 19:58    ECG  Telemetry: PAF, sinus tach, PACs  Echocardiogram 08/25/15  Study Conclusions  - Procedure narrative: Transthoracic echocardiography. Image  quality was adequate. The study was technically difficult. - Left ventricle: The cavity size was normal. There was mild focal  basal hypertrophy of the septum. Systolic function was normal.  Wall motion was normal; there were no regional wall motion  abnormalities. The study is not technically sufficient to allow  evaluation of LV diastolic function. - Aortic valve: Transvalvular velocity was within the normal range.  There was no stenosis. There was mild regurgitation. - Mitral valve: Calcified annulus. There was mild regurgitation. - Left atrium: The atrium was moderately dilated. - Right ventricle: The cavity size was normal. Wall thickness was  normal. Systolic function was normal. - Tricuspid valve: There was mild regurgitation. - Pulmonary arteries: Systolic pressure was moderately increased.  PA peak pressure: 49 mm Hg (S). - Inferior vena cava: The vessel was normal in size. The  respirophasic diameter changes were blunted (< 50%), consistent  with normal central venous pressure.     ASSESSMENT AND PLAN  Active  Problems:   CAP (community acquired pneumonia)   Community acquired pneumonia   Elevated troponin   Hypoxia  1. CAP: being treated with antibiotics per IM.   2. PAF: s/p afib ablation in [facility] 12/2101. Patient appears to have runs of PAF, sinus tach and PACs on telemetry. HR fluctuating in the upper 90s- 130s. This may be precipitated by acute pulmonary illness. However troponin peaked at 1.15. Will need to r/o underlying coronary ischemia. Last stress test was in 2013 and was normal. He notes undergoing a LHC about 30 years ago that was normal. Recent EKG on 5/2 also showed new ST depression/ Inversions compared to prior office EKG 07/2015. Will need to readdress anticoagulation. He was previously on Pradaxa but this was discontinued 6 months ago due to lack of recurrent afib.   3. Elevated Troponin: 1.15>>0.90>0.77. This may be due to demand ischemia from CAP, however given elevation and recurrent PAF, will need to r/o underlying coronary ischemia. Consider NST vs LHC.    Signed, Robbie Lis, PA-C 08/26/2015, 1:26 PM   I have seen and examined the patient along with Robbie Lis, PA-C.  I have reviewed the chart, notes and new data.  I agree with PA's note.  Key new complaints: chest wall pain and cough which were prominent on admission are much improved, so is dyspnea and hypoxia. He had ECG on May 2 at [facility], 2 weeks before pneumonia for "feeling bad, kind of like when I had AF". It showed NSR, but new LAFB and prominent T wave inversion in V5-V6. These were new compared to ECG April 27, which was normal. The T wave changes are not seen on the current ECGs during this admission. Key examination changes: he is in and out of brief atrial fibrillation and sinus rhythm with very frequent PACs, often bigeminy. Rate is controlled. Unaware of irregularity in heart beat. No clinical signs of volume overload. Key new findings / data: minor troponin leak may be attributable to the  pneumonia and arrhythmia, unmasking underlying ischemia. He reports previous "false positive" stress tests in the past leading to normal coronary angiography. He did not have angina pectoris type pain on May 2 or on this admission.  PLAN: He should resume chronic lifelong anticoagulation. Restart BP meds gradually, starting with carvedilol.  While the AF recurrence can be readily attributed to the chest infection, I am concerned about the atypical symptoms and the dynamic ECG changes that recently occurred. I think a coronary CT angio may be the best compromise between the potentially confusing stress nuclear scan (previous false positive) and more aggressive angiography. However, CTA will not be possible with this burden of ectopy. Will discuss early invasive coronary angio versus delayed coronary CTA. Reevaluate options tomorrow. Will also try to call his cardiologist in [place], [place] Vic Ripper, MD, [phone], nurse Rayburn Felt).  Thurmon Fair, MD, [facility] [phone] 08/26/2015, 5:34 PM

## 2015-08-26 NOTE — Progress Notes (Signed)
ANTICOAGULATION CONSULT NOTE   Pharmacy Consult for Heparin Indication: chest pain/ACS  Allergies  Allergen Reactions  . Other Anaphylaxis    Hummus NO GARBANZO BEANS!!!  . Peanut Oil Anaphylaxis    deadly  . Peanut-Containing Drug Products Anaphylaxis  . Sulfa Antibiotics Other (See Comments)    PATIENT WAS (PERHAPS?) ALLERGIC TO THIS CLASS OF MEDS AS A TEENAGER  . Eliquis [Apixaban] Rash  . Spironolactone Rash    Patient Measurements: Height: 5\' 10"  (177.8 cm) Weight: 181 lb 3.5 oz (82.2 kg) IBW/kg (Calculated) : 73  Vital Signs: Temp: 97.2 F (36.2 C) (05/17 0311) Temp Source: Axillary (05/17 0311) BP: 133/75 mmHg (05/17 0311) Pulse Rate: 73 (05/17 0311)  Labs:  Recent Labs  08/24/15 1941 08/25/15 0250 08/25/15 0306 08/25/15 0746 08/25/15 1525 08/25/15 1815 08/26/15 0530  HGB 12.6* 12.9*  --   --   --   --  12.5*  HCT 36.8* 38.8*  --   --   --   --  36.1*  PLT 402* 394  --   --   --   --  459*  HEPARINUNFRC  --   --   --   --   --  <0.10* 0.20*  CREATININE 1.11 1.10  --   --   --   --   --   TROPONINI  --   --  1.15* 0.90* 0.77*  --   --     Estimated Creatinine Clearance: 58.1 mL/min (by C-G formula based on Cr of 1.1).  Assessment: 80 y.o. male with SOB/HF exacerabtion, elevated cardiac markers, for heparin  Goal of Therapy:  Heparin level 0.3-0.7 units/ml Monitor platelets by anticoagulation protocol: Yes   Plan:  Heparin 2000 units IV bolus, then increase heparin 1450 units/hr Check heparin level in 8 hours.   Eddie Candlebbott, Swetha Rayle Vernon 08/26/2015,6:20 AM

## 2015-08-26 NOTE — Progress Notes (Signed)
Subjective: Ivan Mccoy was seen and examined on morning rounds.  He reports that his SOB and coughing are better since yesterday, but continue to linger. He feels he needs oxygen in order to breathe well.  He was on 10L oxygen via CPAP last night and transferred to 6L via nasal cannula this morning without complaint.  He destats to the 80s and becomes tachycardic when O2 is lowered below 6L.  He says nebulizer treatments help tremendously with his coughing and SOB.  He also complains of pleuritic chest pain from coughing so much, but denies frank chest pain or palpitations.  Denies abdominal pain or nausea.  Has been eating well.    Cardiac monitor shows runs of premature atrial contractions - will consult cards and get EKG.   Objective: Vital signs in last 24 hours: Filed Vitals:   08/26/15 0000 08/26/15 0311 08/26/15 0731 08/26/15 0819  BP: 126/69 133/75 142/62   Pulse: 81 73 83   Temp:  97.2 F (36.2 C) 98.4 F (36.9 C)   TempSrc:  Axillary Axillary   Resp: 21 21 18    Height:      Weight:      SpO2: 86% 93% 96% 98%   Weight change:   Intake/Output Summary (Last 24 hours) at 08/26/15 1103 Last data filed at 08/26/15 1033  Gross per 24 hour  Intake 1087.63 ml  Output    926 ml  Net 161.63 ml   General: Alert, talkative.  NAD.  On nasal cannula. Lungs: Clear to ascultation bilaterally.  No wheezes or crackles appreciated. Heart: RRR on exam.  No murmurs. 2+ radial pulses bilaterally  Extremities: Warm, dry.  1+ pedal edema  Lab Results: CBC    Component Value Date/Time   WBC 12.5* 08/26/2015 0530   RBC 3.91* 08/26/2015 0530   HGB 12.5* 08/26/2015 0530   HCT 36.1* 08/26/2015 0530   PLT 459* 08/26/2015 0530   MCV 92.3 08/26/2015 0530   MCH 32.0 08/26/2015 0530   MCHC 34.6 08/26/2015 0530   RDW 12.7 08/26/2015 0530   LYMPHSABS 0.9 08/25/2015 0250   MONOABS 1.6* 08/25/2015 0250   EOSABS 0.0 08/25/2015 0250   BASOSABS 0.0 08/25/2015 0250    HIV: negative S. pneumo  urine: negative Procalcitonin: normal (0.45)   Micro Results: Recent Results (from the past 240 hour(s))  Blood Culture (routine x 2)     Status: None (Preliminary result)   Collection Time: 08/24/15  7:22 PM  Result Value Ref Range Status   Specimen Description BLOOD LEFT HAND  Final   Special Requests IN PEDIATRIC BOTTLE 3CC  Final   Culture NO GROWTH < 24 HOURS  Final   Report Status PENDING  Incomplete  Blood Culture (routine x 2)     Status: None (Preliminary result)   Collection Time: 08/24/15  8:11 PM  Result Value Ref Range Status   Specimen Description BLOOD LEFT ANTECUBITAL  Final   Special Requests BOTTLES DRAWN AEROBIC AND ANAEROBIC 5CC  Final   Culture NO GROWTH < 24 HOURS  Final   Report Status PENDING  Incomplete  MRSA PCR Screening     Status: None   Collection Time: 08/24/15 11:28 PM  Result Value Ref Range Status   MRSA by PCR NEGATIVE NEGATIVE Final    Comment:        The GeneXpert MRSA Assay (FDA approved for NASAL specimens only), is one component of a comprehensive MRSA colonization surveillance program. It is not intended to diagnose MRSA infection  nor to guide or monitor treatment for MRSA infections.   Culture, sputum-assessment     Status: None   Collection Time: 08/25/15  2:50 PM  Result Value Ref Range Status   Specimen Description SPUTUM  Final   Special Requests Normal  Final   Sputum evaluation   Final    THIS SPECIMEN IS ACCEPTABLE. RESPIRATORY CULTURE REPORT TO FOLLOW.   Report Status 08/25/2015 FINAL  Final   Studies/Results: X-ray Chest Pa And Lateral  08/25/2015  CLINICAL DATA:  Shortness of breath since yesterday. Community acquired pneumonia. EXAM: CHEST  2 VIEW COMPARISON:  08/24/2015 FINDINGS: Allowing for lower lung volumes on the current exam, bilateral, centrally predominant, airspace opacities are similar to the previous day's exam. There are small pleural effusions. No pneumothorax. Cardiac silhouette is normal in size.  IMPRESSION: 1. Bilateral airspace opacities with small pleural effusions similar to the previous day's exam. Findings are consistent with either bilateral pneumonia or pulmonary edema. Electronically Signed   By: Amie Portlandavid  Ormond M.D.   On: 08/25/2015 08:07   Dg Chest Port 1 View  08/24/2015  CLINICAL DATA:  Shortness of breath, hypoxia and fever. EXAM: PORTABLE CHEST 1 VIEW COMPARISON:  None. FINDINGS: The heart size and mediastinal contours are within normal limits. Dense infiltrates are seen in the right upper lobe and right lower lobe. There also is suspected to be a component of infiltrate in the left perihilar lung. Findings are consistent with bilateral pneumonia. There also may be mild superimposed interstitial edema without overt airspace edema or visible pleural effusions. No pneumothorax. The visualized skeletal structures are unremarkable. IMPRESSION: Evidence of bilateral pneumonia, right greater than left, and potentially superimposed mild interstitial edema. Electronically Signed   By: Irish LackGlenn  Yamagata M.D.   On: 08/24/2015 19:58   ECHO:  -unable to measure diastolic function/EF -mild mitral and tricuspid regurge -pulmonary artery systolic pressure moderately increased (49)  Medications: I have reviewed the patient's current medications. Scheduled Meds: . aspirin EC  81 mg Oral Daily  . azithromycin  500 mg Oral Q24H  . carvedilol  12.5 mg Oral BID WC  . cefTRIAXone (ROCEPHIN)  IV  2 g Intravenous Q24H  . dextromethorphan-guaiFENesin  1 tablet Oral BID  . dorzolamide-timolol  1 drop Left Eye BID  . furosemide  40 mg Intravenous Q12H  . ipratropium-albuterol  3 mL Nebulization TID  . pravastatin  40 mg Oral QHS  . zolpidem  5 mg Oral Once   Continuous Infusions: . heparin     PRN Meds:.acetaminophen **OR** acetaminophen, guaiFENesin-dextromethorphan, traZODone  Assessment/Plan: Shortness of breath Symptoms likely due to pneumonia.  WBC has decreased, now at 12.5 down from 17.9  yesterday.  Symptoms continue to improve.  Pt currently on Day 2 of azithromycin and ceftriaxone.  Pt now on 6L nasal cannula, down from 10L yesterday.  CXR yesterday concerning for bilateral PNA vs. Pulmonary edema.  Pt is currently on Lasix 40mg  tid with 1.1 L output yesterday to help with diuresis for possible pulmonary edema.  Pt also on heparin drip for possible heart issue, although less likely to be causing current symptoms given downtrending troponins (0.77 yesterday afternoon, down from 0.9 earlier in the day).  ECHO showed moderately elevated pulmonary artery pressure but could not measure EF and diastolic function.  [facility] findings similar to this hospitalization.  EF in 2013 was 50-55%.  During exam today, monitor showed premature atrial contractions, which may suggest an underlying arrhythmia.  Will get  cards involved for further workup.  -Cardiology consult, appreciate recs  -12 lead EKG to workup arrhythmia       -BMP this afternoon to look for association between K+ and arrhythmia  -Decrease frequency of lasix to  bid  -Heparin drip at 64mL/hr -Oxygen therapy as needed goal SpO2 >92%. Currently on 6L.  Wean O2 as able.  -Ceftriaxone 2g IV qday -Azithromycin  qday -Mucinex bid -Nebulizer tid    HFpEF: Denies any chest pain at this time.  Cardiac monitor in room shows premature atrial contractions. Will work up as explained above: -Cardiology consult, appreciate recs  -12 lead EKG to workup arrhythmia      -BMP this afternoon to look for association between K+ and arrhythmia  -Heparin drip at 71mL/hr -ASA   Hypertension: BP well-controlled in 130s-140s.   -Coreg 12.5 mg bid   Hyperlipidemia -pravastatin  qday  This is a Psychologist, occupational Note.  The care of the patient was discussed with Dr. Valentino Nose and the assessment and plan formulated with their assistance.  Please see their attached note for official  documentation of the daily encounter.   LOS: 2 days   Nathaneil Canary, Med Student 08/26/2015, 11:03 AM

## 2015-08-27 LAB — CBC
HEMATOCRIT: 36.4 % — AB (ref 39.0–52.0)
HEMOGLOBIN: 12.8 g/dL — AB (ref 13.0–17.0)
MCH: 31.8 pg (ref 26.0–34.0)
MCHC: 35.2 g/dL (ref 30.0–36.0)
MCV: 90.5 fL (ref 78.0–100.0)
Platelets: 486 10*3/uL — ABNORMAL HIGH (ref 150–400)
RBC: 4.02 MIL/uL — AB (ref 4.22–5.81)
RDW: 12.6 % (ref 11.5–15.5)
WBC: 11 10*3/uL — ABNORMAL HIGH (ref 4.0–10.5)

## 2015-08-27 LAB — MAGNESIUM: Magnesium: 2.1 mg/dL (ref 1.7–2.4)

## 2015-08-27 LAB — PROCALCITONIN: Procalcitonin: 0.31 ng/mL

## 2015-08-27 LAB — BASIC METABOLIC PANEL
ANION GAP: 10 (ref 5–15)
Anion gap: 16 — ABNORMAL HIGH (ref 5–15)
BUN: 29 mg/dL — AB (ref 6–20)
BUN: 26 mg/dL — ABNORMAL HIGH (ref 6–20)
CALCIUM: 9.3 mg/dL (ref 8.9–10.3)
CHLORIDE: 101 mmol/L (ref 101–111)
CO2: 24 mmol/L (ref 22–32)
CO2: 27 mmol/L (ref 22–32)
Calcium: 9.1 mg/dL (ref 8.9–10.3)
Chloride: 103 mmol/L (ref 101–111)
Creatinine, Ser: 1.11 mg/dL (ref 0.61–1.24)
Creatinine, Ser: 1.07 mg/dL (ref 0.61–1.24)
GFR calc Af Amer: >60 mL/min (ref >60)
GFR calc non Af Amer: >60 mL/min (ref >60)
Glucose, Bld: 142 mg/dL — ABNORMAL HIGH (ref 65–99)
Glucose, Bld: 127 mg/dL — ABNORMAL HIGH (ref 65–99)
POTASSIUM: 3 mmol/L — AB (ref 3.5–5.1)
Potassium: 3.6 mmol/L (ref 3.5–5.1)
SODIUM: 141 mmol/L (ref 135–145)
Sodium: 140 mmol/L (ref 135–145)

## 2015-08-27 LAB — HEPARIN LEVEL (UNFRACTIONATED): HEPARIN UNFRACTIONATED: 0.56 [IU]/mL (ref 0.30–0.70)

## 2015-08-27 MED ORDER — POTASSIUM CHLORIDE CRYS ER 20 MEQ PO TBCR
40.0000 meq | EXTENDED_RELEASE_TABLET | Freq: Once | ORAL | Status: AC
  Administered 2015-08-27: 40 meq via ORAL
  Filled 2015-08-27: qty 2

## 2015-08-27 MED ORDER — FUROSEMIDE 10 MG/ML IJ SOLN
40.0000 mg | Freq: Three times a day (TID) | INTRAMUSCULAR | Status: DC
  Administered 2015-08-27 – 2015-08-30 (×8): 40 mg via INTRAVENOUS
  Filled 2015-08-27 (×8): qty 4

## 2015-08-27 MED ORDER — DABIGATRAN ETEXILATE MESYLATE 150 MG PO CAPS
150.0000 mg | ORAL_CAPSULE | Freq: Two times a day (BID) | ORAL | Status: DC
  Administered 2015-08-27 – 2015-08-29 (×6): 150 mg via ORAL
  Filled 2015-08-27 (×7): qty 1

## 2015-08-27 NOTE — Care Management Important Message (Signed)
Important Message  Patient Details  Name: Marsa ArisCharles Thier MRN: [id number] Date of Birth: 11/14/1935   Medicare Important Message Given:  Yes    Abrian Hanover T, RN 08/27/2015, 2:28 PM

## 2015-08-27 NOTE — Progress Notes (Signed)
ANTICOAGULATION CONSULT NOTE   Pharmacy Consult for Heparin Indication: chest pain/ACS  Allergies  Allergen Reactions  . Other Anaphylaxis    Hummus NO GARBANZO BEANS!!!  . Peanut Oil Anaphylaxis    deadly  . Peanut-Containing Drug Products Anaphylaxis  . Sulfa Antibiotics Other (See Comments)    PATIENT WAS (PERHAPS?) ALLERGIC TO THIS CLASS OF MEDS AS A TEENAGER  . Eliquis [Apixaban] Rash  . Spironolactone Rash    Patient Measurements: Height: 5\' 10"  (177.8 cm) Weight: 181 lb 3.5 oz (82.2 kg) IBW/kg (Calculated) : 73  Vital Signs: Temp: 97.8 F (36.6 C) (05/18 0042) Temp Source: Axillary (05/18 0042) BP: 126/67 mmHg (05/17 2300) Pulse Rate: 29 (05/18 0042)  Labs:  Recent Labs  08/24/15 1941 08/25/15 0250 08/25/15 0306 08/25/15 0746 08/25/15 1525  08/26/15 0530 08/26/15 1053 08/26/15 1416 08/27/15 0002  HGB 12.6* 12.9*  --   --   --   --  12.5*  --   --   --   HCT 36.8* 38.8*  --   --   --   --  36.1*  --   --   --   PLT 402* 394  --   --   --   --  459*  --   --   --   HEPARINUNFRC  --   --   --   --   --   < > 0.20*  --  0.21* <0.10*  CREATININE 1.11 1.10  --   --   --   --   --  1.10  --   --   TROPONINI  --   --  1.15* 0.90* 0.77*  --   --   --   --   --   < > = values in this interval not displayed.  Estimated Creatinine Clearance: 58.1 mL/min (by C-G formula based on Cr of 1.1).  Assessment: 80 y.o. male with SOB/HF exacerbation, elevated troponins who continues on heparin. Heparin level now down to undetectable on 1600 units/hr.  Per RN, heparin gtt was off when he came in at 7p (this was not charted as being off and no notation of it being turned off so unsure how long heparin gtt was off). Heparin gtt turned back on ~7p and has been running fine since then. No bleeding noted.  Goal of Therapy:  Heparin level 0.3-0.7 units/ml Monitor platelets by anticoagulation protocol: Yes   Plan:  -Increase heparin at 1850 units/hr -F/u 8hr heparin  level  Christoper Fabianaron Mccartney Brucks, PharmD, BCPS Clinical pharmacist, pager [phone] 08/27/2015 1:42 AM

## 2015-08-27 NOTE — Progress Notes (Signed)
Pt's wife called RN to room. Wants to show me a small orange that has mold on it. Says her husband ate an orange from the same bag and is wondering if his pneumonia cam from that orange. Explained to wife that what is growing from Respiratory culture is rods & chains and that mold was not specifically identified. Wife insisted that I contact DR about the moldy fruit. Text page to team to make them aware of the moldy/rotted fruit. Wife states that the fruit her husband( patient) did eat was washed and peeled prior to eating.

## 2015-08-27 NOTE — Progress Notes (Signed)
RT placed patient on CPAP HS. Patient needed 6L O2 bleed in. Patient tolerating well. RT will continue to monitor as needed.

## 2015-08-27 NOTE — Progress Notes (Signed)
Subjective: [name] was seen and examined during morning rounds.  He reports that he has SOB when oxygen weaned under 5L.  He continues to endorse a cough and is producing pink-tinged sputum.  Flutter-valve has been helpful at clearing secretions.  Nebulizer treatments continue to help with SOB.  Pt denies abdominal pain or nausea. Today he has some worsening edema in bilateral extremities.  He discussed preference [facility] vs. [place]patient ang[facility]. Discussed restarting Pradaxa per cards recommendation.   Objective: Vital signs in last 24 hours: Filed Vitals:   08/27/15 0840 08/27/15 1040 08/27/15 1227 08/27/15 1333  BP:  153/73 112/94   Pulse:  87 76   Temp:   99.4 F (37.4 C)   TempSrc:   Oral   Resp:   19   Height:      Weight:      SpO2: 95%  95% 98%   Weight change:   Intake/Output Summary (Last 24 hours) at 08/27/15 1529 Last data filed at 08/27/15 1400  Gross per 24 hour  Intake 1058.25 ml  Output   1020 ml  Net  38.25 ml   General: Alert, talkative. NAD. On nasal cannula - 4.5 L.  Lungs: Wheezing in left lower lobe apex and base on inspiration and expiration.  Mild wheezing in right lower lobe base.  No crackles.  Heart: Irregularly irregular. No murmurs. 2+ radial pulses bilaterally  Extremities: Warm, dry. 2+ bilateral lower extremity edema to knees.   Lab Results: CBC    Component Value Date/Time   WBC 11.0* 08/27/2015 0725   RBC 4.02* 08/27/2015 0725   HGB 12.8* 08/27/2015 0725   HCT 36.4* 08/27/2015 0725   PLT 486* 08/27/2015 0725   MCV 90.5 08/27/2015 0725   MCH 31.8 08/27/2015 0725   MCHC 35.2 08/27/2015 0725   RDW 12.6 08/27/2015 0725   LYMPHSABS 0.9 08/25/2015 0250   MONOABS 1.6* 08/25/2015 0250   EOSABS 0.0 08/25/2015 0250   BASOSABS 0.0 08/25/2015 0250    BMP Latest Ref Rng 08/27/2015 08/26/2015 08/25/2015  Glucose 65 - 99 mg/dL 161(W142(H) 960(A156(H) 540(J161(H)  BUN 6 - 20 mg/dL 81(X29(H) 91(Y31(H) 78(G24(H)    Creatinine 0.61 - 1.24 mg/dL 9.561.11 2.131.10 0.861.10  Sodium 135 - 145 mmol/L 141 142 141  Potassium 3.5 - 5.1 mmol/L 3.0(L) 3.7 3.7  Chloride 101 - 111 mmol/L 101 100(L) 106  CO2 22 - 32 mmol/L 24 27 23   Calcium 8.9 - 10.3 mg/dL 9.1 9.8 9.0   Sputum culture: G(+) cocci in pairs    Micro Results: Recent Results (from the past 240 hour(s))  Blood Culture (routine x 2)     Status: None (Preliminary result)   Collection Time: 08/24/15  7:22 PM  Result Value Ref Range Status   Specimen Description BLOOD LEFT HAND  Final   Special Requests IN PEDIATRIC BOTTLE 3CC  Final   Culture NO GROWTH 3 DAYS  Final   Report Status PENDING  Incomplete  Blood Culture (routine x 2)     Status: None (Preliminary result)   Collection Time: 08/24/15  8:11 PM  Result Value Ref Range Status   Specimen Description BLOOD LEFT ANTECUBITAL  Final   Special Requests BOTTLES DRAWN AEROBIC AND ANAEROBIC 5CC  Final   Culture NO GROWTH 3 DAYS  Final   Report Status PENDING  Incomplete  Urine culture     Status: None   Collection Time: 08/24/15 11:27 PM  Result Value Ref Range Status  Specimen Description URINE, RANDOM  Final   Special Requests NONE  Final   Culture NO GROWTH  Final   Report Status 08/26/2015 FINAL  Final  MRSA PCR Screening     Status: None   Collection Time: 08/24/15 11:28 PM  Result Value Ref Range Status   MRSA by PCR NEGATIVE NEGATIVE Final    Comment:        The GeneXpert MRSA Assay (FDA approved for NASAL specimens only), is one component of a comprehensive MRSA colonization surveillance program. It is not intended to diagnose MRSA infection nor to guide or monitor treatment for MRSA infections.   Culture, sputum-assessment     Status: None   Collection Time: 08/25/15  2:50 PM  Result Value Ref Range Status   Specimen Description SPUTUM  Final   Special Requests Normal  Final   Sputum evaluation   Final    THIS SPECIMEN IS ACCEPTABLE. RESPIRATORY CULTURE REPORT TO FOLLOW.    Report Status 08/25/2015 FINAL  Final  Culture, respiratory (NON-Expectorated)     Status: None (Preliminary result)   Collection Time: 08/25/15  3:09 PM  Result Value Ref Range Status   Specimen Description SPUTUM  Final   Special Requests NONE  Final   Gram Stain   Final    MODERATE WBC PRESENT,BOTH PMN AND MONONUCLEAR FEW SQUAMOUS EPITHELIAL CELLS PRESENT MODERATE GRAM POSITIVE COCCI IN PAIRS MODERATE GRAM NEGATIVE RODS THIS SPECIMEN IS ACCEPTABLE FOR SPUTUM CULTURE Performed at [facility]    Culture   Final    Culture reincubated for better growth Performed at [facility]    Report Status PENDING  Incomplete   Studies/Results: No results found. Medications: I have reviewed the patient's current medications. Scheduled Meds: . aspirin EC  81 mg Oral Daily  . azithromycin  500 mg Oral Q24H  . carvedilol  12.5 mg Oral BID WC  . cefTRIAXone (ROCEPHIN)  IV  2 g Intravenous Q24H  . dabigatran  150 mg Oral Q12H  . dextromethorphan-guaiFENesin  1 tablet Oral BID  . dorzolamide-timolol  1 drop Left Eye BID  . furosemide  40 mg Intravenous Q8H  . ipratropium-albuterol  3 mL Nebulization TID  . pravastatin  40 mg Oral QHS  . zolpidem  5 mg Oral Once   Continuous Infusions:  PRN Meds:.acetaminophen **OR** acetaminophen, guaiFENesin-dextromethorphan, traZODone  Assessment/Plan: Shortness of breath Sputum culture grew G(+) cocci in pairs - highly suggestive of strep pneumo PNA.  WBC continue to trend downward.  Currently on Day 3 of azithromycin + ceftriaxone.  Pt tolerating nasal cannula at 4.5 L but has decreased sats at lower O2 volumes.  Also concerned for compounding fluid overload, on Lasix 40mg  bid but has worsening lower extremity edema. Will increase Lasix dosing today.  Discussed discharge goals with patient, including weaning off oxygen with possibility of being discharged with short-term oxygen therapy.  -Oxygen therapy as needed goal SpO2 >92%.  Currently on 4.5L. Wean O2 as able.  -Ceftriaxone 2g IV qday -Azithromycin 500mg  qday -Mucinex bid -Nebulizer tid -Switch Lasix to 40mg  tid  -daily weights   HFpEF: Denies any chest pain at this time.  Has been in and out of a-fib, a-flutter, PACs.  Cardiology consulted and cannot rule out prior ischemic episode at this time.  Will need to restart long-term anticoagulant.  Pt was previously on Pradaxa 150mg  bid and tolerated in well.  Discussed need for coronary angiography in setting of prior false negative stress tests in past.  Discussed options at length with patient - inpatient coronary angiography vs. Outpatient CT angiogram.  Patient would prefer to be seen outpatient by Dr. Melvyn Neth in [place].  Team left message with Dr. Theadore Nan nurse in [place] regarding patient.  Will need to follow-up.  Tele bed today, transfer to floor bed tomorrow if no acute issues overnight.  -Cardiology following patient, appreciate help   -Discontinue heparin drip  -Start Pradaxa 150 mg bid   -ASA   Hypertension: BP well-controlled in 130s-140s.  -Coreg 12.5 mg bid   Hyperlipidemia  This is a Psychologist, occupational Note.  The care of the patient was discussed with Dr. Valentino Nose and the assessment and plan formulated with their assistance.  Please see their attached note for official documentation of the daily encounter.   LOS: 3 days   Nathaneil Canary, Med Student 08/27/2015, 3:29 PM

## 2015-08-27 NOTE — Progress Notes (Signed)
Pharmacist Student Provided - Patient Medication Education Education Best boyof B1 [facility] Team Teaching Service Patient  Education: Educated the patient on some of current medications, specifically Pradaxa. Reviewed side effects with the patient. Answered the patients medication specific questions. These medications are on the patient's current med list and are subject to change upon discharge.  Dola Argyleamika [employer] Null  P4 PharmD Candidate

## 2015-08-27 NOTE — Progress Notes (Signed)
Patient Name: Ivan Mccoy Date of Encounter: 08/27/2015  Active Problems:   CAP (community acquired pneumonia)   Community acquired pneumonia   Elevated troponin   Hypoxia   Length of Stay: 3  SUBJECTIVE  Continues to go in and out of atrial fibrillation, controlled rate. Still hypoxic, O2 sat 91% on 5 L O2 at rest. Otherwise feels very well.  CURRENT MEDS . aspirin EC  81 mg Oral Daily  . azithromycin  500 mg Oral Q24H  . carvedilol  12.5 mg Oral BID WC  . cefTRIAXone (ROCEPHIN)  IV  2 g Intravenous Q24H  . dextromethorphan-guaiFENesin  1 tablet Oral BID  . dorzolamide-timolol  1 drop Left Eye BID  . furosemide  40 mg Intravenous Q12H  . ipratropium-albuterol  3 mL Nebulization TID  . potassium chloride  40 mEq Oral Once  . pravastatin  40 mg Oral QHS  . zolpidem  5 mg Oral Once    OBJECTIVE   Intake/Output Summary (Last 24 hours) at 08/27/15 1010 Last data filed at 08/27/15 0745  Gross per 24 hour  Intake 439.25 ml  Output   1256 ml  Net -816.75 ml   Filed Weights   08/24/15 1923 08/24/15 2331  Weight: 80.287 kg (177 lb) 82.2 kg (181 lb 3.5 oz)    PHYSICAL EXAM Filed Vitals:   08/27/15 0400 08/27/15 0429 08/27/15 0832 08/27/15 0840  BP: 132/61  143/78   Pulse: 72 83 72   Temp:  98.8 F (37.1 C) 98.1 F (36.7 C)   TempSrc:  Oral Oral   Resp: 25 22 13    Height:      Weight:      SpO2: 94% 91% 93% 95%   General: Alert, oriented x3, no distress Head: no evidence of trauma, PERRL, EOMI, no exophtalmos or lid lag, no myxedema, no xanthelasma; normal ears, nose and oropharynx Neck: normal jugular venous pulsations and no hepatojugular reflux; brisk carotid pulses without delay and no carotid bruits Chest: clear to auscultation, no signs of consolidation by percussion or palpation, normal fremitus, symmetrical and full respiratory excursions Cardiovascular: normal position and quality of the apical impulse, regular rhythm, normal first and second heart  sounds, no rubs or gallops, no murmur Abdomen: no tenderness or distention, no masses by palpation, no abnormal pulsatility or arterial bruits, normal bowel sounds, no hepatosplenomegaly Extremities: no clubbing, cyanosis or edema; 2+ radial, ulnar and brachial pulses bilaterally; 2+ right femoral, posterior tibial and dorsalis pedis pulses; 2+ left femoral, posterior tibial and dorsalis pedis pulses; no subclavian or femoral bruits Neurological: grossly nonfocal  LABS  CBC  Recent Labs  08/24/15 1941 08/25/15 0250 08/26/15 0530 08/27/15 0725  WBC 15.6* 17.8* 12.5* 11.0*  NEUTROABS 13.6* 16.2*  --   --   HGB 12.6* 12.9* 12.5* 12.8*  HCT 36.8* 38.8* 36.1* 36.4*  MCV 92.9 93.5 92.3 90.5  PLT 402* 394 459* 486*   Basic Metabolic Panel  Recent Labs  08/26/15 1053 08/27/15 0725  NA 142 141  K 3.7 3.0*  CL 100* 101  CO2 27 24  GLUCOSE 156* 142*  BUN 31* 29*  CREATININE 1.10 1.11  CALCIUM 9.8 9.1   Liver Function Tests  Recent Labs  08/24/15 1941  AST 26  ALT 15*  ALKPHOS 35*  BILITOT 1.7*  PROT 6.7  ALBUMIN 3.5   No results for input(s): LIPASE, AMYLASE in the last 72 hours. Cardiac Enzymes  Recent Labs  08/25/15 0306 08/25/15 0746 08/25/15 1525  TROPONINI 1.15*  0.90* 0.77*    Radiology Studies No new results found.  TELE Recurrent atrial fibrillation, atypical flutter and NSR with very frequent PACs   ASSESSMENT AND PLAN  1. Atrial fibrillation - rate controlled, suspect will resolve as the pneumonia and hypoxemia improve. Restart Pradaxa, stop heparin. 2. Transient ischemic ECG changes and mildly abnormal troponin - CT angio may not be a good option since his rhythm may not settle down enough. History of false positive nuclear studies in past. Best option may be coronary angiography, but this is not emergent and should be delayed until his pneumonia resolves. He would prefer workup in [place]. Tried to contact Dr. Melvyn Mccoy at his satellite  [place] office, but never able to actually speak to him. Will try again later. 3. Hypokalemia - replete 4. Pneumonia - WBC improving daily, pleurisy and cough resolved, still needs a lot of O2   Ivan Fair, MD, [facility] [phone] office [phone] pager 08/27/2015 10:10 AM

## 2015-08-27 NOTE — Progress Notes (Signed)
Pt signed release of information form. [facility] [place]  where pt states he had previous heart caths prior to 1995. Follow up phone call states they received the fax - left on chart for verification

## 2015-08-27 NOTE — Discharge Instructions (Addendum)
[name], we are starting you on several medications after you leave the hospital. I am providing you with prescriptions for these medications. You will need to take the Xarelto every day for your atrial fibrillation.  We are also starting you on Aspirin 81 mg daily and Plavix after your heart cath with the stent placement. You will need to be on the Aspirin for a month and the Plavix for a year.  We have also started you on a new blood pressure medication called Imdur. Please stop taking the Amlodipine and Clonidine as we have not been giving you those in the hospital and your Blood Pressure has been well controlled. We will also increase the dose of your lasix from 20 mg daily to 40 mg twice a day. This can cause your potassium to drop so we are giving you a potassium pill to take every day.  We scheduled you a follow up appointment with Dr. Jeanelle MallingPiper on Thursday June 8 at 11 am.    Information on my medicine - XARELTO (Rivaroxaban)  This medication education was reviewed with me or my healthcare representative as part of my discharge preparation.  The pharmacist that spoke with me during my hospital stay was:  Remi HaggardAlyson N Leonard, [facility]  Why was Xarelto prescribed for you? Xarelto was prescribed for you to reduce the risk of a blood clot forming that can cause a stroke if you have a medical condition called atrial fibrillation (a type of irregular heartbeat).  What do you need to know about xarelto ? Take your Xarelto ONCE DAILY at the same time every day with your evening meal. If you have difficulty swallowing the tablet whole, you may crush it and mix in applesauce just prior to taking your dose.  Take Xarelto exactly as prescribed by your doctor and DO NOT stop taking Xarelto without talking to the doctor who prescribed the medication.  Stopping without other stroke prevention medication to take the place of Xarelto may increase your risk of developing a clot that causes a stroke.  Refill  your prescription before you run out.  After discharge, you should have regular check-up appointments with your healthcare provider that is prescribing your Xarelto.  In the future your dose may need to be changed if your kidney function or weight changes by a significant amount.  What do you do if you miss a dose? If you are taking Xarelto ONCE DAILY and you miss a dose, take it as soon as you remember on the same day then continue your regularly scheduled once daily regimen the next day. Do not take two doses of Xarelto at the same time or on the same day.   Important Safety Information A possible side effect of Xarelto is bleeding. You should call your healthcare provider right away if you experience any of the following: ? Bleeding from an injury or your nose that does not stop. ? Unusual colored urine (red or dark brown) or unusual colored stools (red or black). ? Unusual bruising for unknown reasons. ? A serious fall or if you hit your head (even if there is no bleeding).  Some medicines may interact with Xarelto and might increase your risk of bleeding while on Xarelto. To help avoid this, consult your healthcare provider or pharmacist prior to using any new prescription or non-prescription medications, including herbals, vitamins, non-steroidal anti-inflammatory drugs (NSAIDs) and supplements.  This website has more information on Xarelto: [email or web address].xarelto.[email or web address]. ------------------------------------------------------------------------------------------------------

## 2015-08-27 NOTE — Progress Notes (Signed)
  Date: 08/27/2015  Patient name: Ivan Mccoy  Medical record number: [id number]  Date of birth: 04/24/1935   This patient has been seen and the plan of care was discussed with the house staff. Please see Dr. Bonney RousselBoswell's note for complete details. I concur with his findings.  Inez CatalinaEmily B Tadan Shill, MD 08/27/2015, 5:01 PM

## 2015-08-27 NOTE — Progress Notes (Signed)
Subjective: No events overnight. Reports breathing is slowly improving. He is able to talk in full sentences today with no shortness of breath. Daughter at bedside concerned he looks worse than yesterday to her but he is steadily improving. He reports cough is worse with sputum production now. Some bloody streaking in the mucous. He denies any chest pain or palpitations.    Objective: Vital signs in last 24 hours: Filed Vitals:   08/27/15 0400 08/27/15 0429 08/27/15 0832 08/27/15 0840  BP: 132/61  143/78   Pulse: 72 83 72   Temp:  98.8 F (37.1 C) 98.1 F (36.7 C)   TempSrc:  Oral Oral   Resp: 25 22 13    Height:      Weight:      SpO2: 94% 91% 93% 95%   Weight change:   Intake/Output Summary (Last 24 hours) at 08/27/15 0947 Last data filed at 08/27/15 0745  Gross per 24 hour  Intake 439.25 ml  Output   1256 ml  Net -816.75 ml    General: alert, well-developed, and cooperative to examination.  Neck: supple, full ROM, no carotid bruits. No JVD appreciated.  Lungs: normal respiratory effort, no accessory muscle use, mild wheezes L>R Heart: tachycardic, regular rhythm with occasional irregular beat, no murmur, no gallop, and no rub.  Abdomen: soft, non-tender, normal bowel sounds, no distentio Pulses: 2+ DP/PT pulses bilaterally Extremities: No cyanosis, worsened edema b/l LE, 2+ pitting edema Psych: normal mood and affect  Medications: I have reviewed the patient's current medications. Scheduled Meds: . aspirin EC  81 mg Oral Daily  . azithromycin  500 mg Oral Q24H  . carvedilol  12.5 mg Oral BID WC  . cefTRIAXone (ROCEPHIN)  IV  2 g Intravenous Q24H  . dextromethorphan-guaiFENesin  1 tablet Oral BID  . dorzolamide-timolol  1 drop Left Eye BID  . furosemide  40 mg Intravenous Q12H  . ipratropium-albuterol  3 mL Nebulization TID  . potassium chloride  40 mEq Oral Once  . pravastatin  40 mg Oral QHS  . zolpidem  5 mg Oral Once   Continuous Infusions: . heparin 1,850  Units/hr (08/27/15 0154)   PRN Meds:.acetaminophen **OR** acetaminophen, guaiFENesin-dextromethorphan, traZODone Assessment/Plan:  CAP: Patient remains afebrile. Leukocytosis improving. Able to come off NRB yesterday. Tolerating [place] but requiring 5L O2 to maintain O2 sats in low 90s. Currently on day 3 of Ceftriaxone and Azithromycin. Will continue antibiotics ans try to wean off O2 as tolerated. Sputum culture growing G+ cocci in pairs, likely strep pneumo though Urine Ag is negative. Scattered wheezing but no crackles on exam.  -Oxygen therapy as needed goal SpO2 >92% -Repeat AM Bmet, CBC -Ceftriaxone 2g IV 5/15>>> -Azithromycin 500mg  5/15>>> -Lasix 40 mg IV BID > increase to TID today -Robitussin DM prn -Tylenol prn -F/U BCx > NGTD -F/U Sputum Cx > G+ cocci in pairs  PAF: History of A. Fib s/p ablation. Review of telemetry shows recurrent a. Fib with frequent PACs. Evaluated by cardiology. Concern for acute pneumonia and a.fib causing new ischemia. Previous false positive stress tests with negative caths make him a poor candidate for nuclear stress test. Likely that a. Fib is precipiated by his acute infection. Will need some ischemic work up. Patient [facility]. [place].  -Appreciate cardiology help -[place](W. Lance Lewis, MD, [phone], nurse Rayburn FeltNeeta). -stop heparin gtt -re-start dabigatran (previously on,  stopped 6 months ago) -increase lasix to 40 tid  Hypokalemia: K 3.0 this morning. Has been receiving lasix during hospitalization. Replacing with Oral K as needed. -KDur 40 mEq -Mg 2.1 -BMET tonight and in am  HFpEF: From review of cardiology records at [facility] appears diastolic dysfunction and LVH without significantly reduced EF. Previous stress studies consistently false positives with negative subsequent caths so likely NICM.  ECHO done yesterday with no LV dysfunction but unable to determine EF. Unable to determine if diastolic dysfunction present. PAP mildly elevated at 49. May need repeat ECHO prior to discharge when respiratory status improved and patient able to tolerate test better.  -ASA  -cardiology consulted, appreciate the consult -Continue home Coreg 12.5mg  BID -Lasix 40 mg IV TID  HTN: On many agents PTA amlodipine , clonidine 0.1mg  TID, eplerenone , losartan . Will hold these. BP labile today, 112-153 systolic.  -Consider restarting home meds if BP remains elevated -Continue Coreg  HLD: Stable chronic problem. - Continue home pravastatin , fenofibrate .  OSA:. qHS CPAP.  FULL CODE  Diet: Heart Healthy  VTE ppx: [place] enoxaparin  Dispo: Disposition is deferred at this time, awaiting improvement of current medical problems.    The patient does have a current PCP (No primary care provider on file.) and does need an [facility] hospital follow-up appointment after discharge.  The patient does not have transportation limitations that hinder transportation to clinic appointments.  .Services Needed at time of discharge: Y = Yes, Blank = No PT:   OT:   RN:   Equipment:   Other:     LOS: 3 days   Valentino Nose, MD IMTS PGY-1 [phone] 08/27/2015, 9:47 AM

## 2015-08-28 DIAGNOSIS — I248 Other forms of acute ischemic heart disease: Secondary | ICD-10-CM

## 2015-08-28 DIAGNOSIS — G4733 Obstructive sleep apnea (adult) (pediatric): Secondary | ICD-10-CM

## 2015-08-28 DIAGNOSIS — Z9989 Dependence on other enabling machines and devices: Secondary | ICD-10-CM

## 2015-08-28 DIAGNOSIS — I480 Paroxysmal atrial fibrillation: Secondary | ICD-10-CM

## 2015-08-28 DIAGNOSIS — E785 Hyperlipidemia, unspecified: Secondary | ICD-10-CM

## 2015-08-28 DIAGNOSIS — I5032 Chronic diastolic (congestive) heart failure: Secondary | ICD-10-CM

## 2015-08-28 DIAGNOSIS — I200 Unstable angina: Secondary | ICD-10-CM

## 2015-08-28 DIAGNOSIS — E876 Hypokalemia: Secondary | ICD-10-CM

## 2015-08-28 DIAGNOSIS — I110 Hypertensive heart disease with heart failure: Secondary | ICD-10-CM

## 2015-08-28 LAB — BASIC METABOLIC PANEL
Anion gap: 11 (ref 5–15)
BUN: 23 mg/dL — AB (ref 6–20)
CHLORIDE: 103 mmol/L (ref 101–111)
CO2: 28 mmol/L (ref 22–32)
CREATININE: 0.96 mg/dL (ref 0.61–1.24)
Calcium: 9.3 mg/dL (ref 8.9–10.3)
GFR calc Af Amer: >60 mL/min (ref >60)
Glucose, Bld: 134 mg/dL — ABNORMAL HIGH (ref 65–99)
Potassium: 3.3 mmol/L — ABNORMAL LOW (ref 3.5–5.1)
SODIUM: 142 mmol/L (ref 135–145)

## 2015-08-28 LAB — CBC
HCT: 36.1 % — ABNORMAL LOW (ref 39.0–52.0)
Hemoglobin: 12.3 g/dL — ABNORMAL LOW (ref 13.0–17.0)
MCH: 31.1 pg (ref 26.0–34.0)
MCHC: 34.1 g/dL (ref 30.0–36.0)
MCV: 91.2 fL (ref 78.0–100.0)
PLATELETS: 480 10*3/uL — AB (ref 150–400)
RBC: 3.96 MIL/uL — ABNORMAL LOW (ref 4.22–5.81)
RDW: 12.6 % (ref 11.5–15.5)
WBC: 10.2 10*3/uL (ref 4.0–10.5)

## 2015-08-28 LAB — CULTURE, RESPIRATORY W GRAM STAIN

## 2015-08-28 LAB — CULTURE, RESPIRATORY: CULTURE: NORMAL

## 2015-08-28 MED ORDER — POTASSIUM CHLORIDE CRYS ER 20 MEQ PO TBCR
40.0000 meq | EXTENDED_RELEASE_TABLET | Freq: Every day | ORAL | Status: DC
  Administered 2015-08-28 – 2015-08-29 (×2): 40 meq via ORAL
  Filled 2015-08-28 (×2): qty 2

## 2015-08-28 MED ORDER — IPRATROPIUM-ALBUTEROL 0.5-2.5 (3) MG/3ML IN SOLN
3.0000 mL | Freq: Three times a day (TID) | RESPIRATORY_TRACT | Status: DC
  Administered 2015-08-28 – 2015-09-02 (×14): 3 mL via RESPIRATORY_TRACT
  Filled 2015-08-28 (×16): qty 3

## 2015-08-28 MED ORDER — IPRATROPIUM-ALBUTEROL 0.5-2.5 (3) MG/3ML IN SOLN
3.0000 mL | RESPIRATORY_TRACT | Status: DC | PRN
  Administered 2015-08-28: 3 mL via RESPIRATORY_TRACT

## 2015-08-28 MED ORDER — IPRATROPIUM-ALBUTEROL 0.5-2.5 (3) MG/3ML IN SOLN: 3.0000 mL | Freq: Four times a day (QID) | RESPIRATORY_TRACT | Status: DC

## 2015-08-28 NOTE — Progress Notes (Signed)
08/28/2015 12:57 PM  Pt transferrred to 6E02 via wheelchair accompanied by wife with whom he lives.  [facility] [place] festivities when he was admitted to the hospital.  Pt currently denying pain, vitals stable, full assessment to EPIC.  Skin intact, however, BLE are darkened/discolored.  Pt placed on telemetry box 2, confirmed with CCMD and Camieko, RN.  Pt standby assist, denies any falls within the past six months.  Pt oriented to room/unit, and was instructed on how to utilize the call bell, to which he verbalized understanding.  Will continue to monitor patient. Theadora RamaKIRKMAN, Sema Stangler Brooke

## 2015-08-28 NOTE — Progress Notes (Signed)
Patient Name: Marsa ArisCharles Cerda Date of Encounter: 08/28/2015  Active Problems:   CAP (community acquired pneumonia)   Community acquired pneumonia   Elevated troponin   Hypoxia  Patient Profile: 80 year old male with a past medical history of PAF. Presented to ED on 08/26/15 with SOB, cough and subjective fever and chills. Being treated for CAP, had elevated troponin.   SUBJECTIVE: Feels well, sitting up. Has more energy. No chest pain. Still with SOB with activity.   OBJECTIVE Filed Vitals:   08/28/15 0500 08/28/15 0747 08/28/15 0800 08/28/15 1000  BP:   159/82 135/82  Pulse:   87 85  Temp:   98.3 F (36.8 C)   TempSrc:   Oral   Resp:   23 18  Height:      Weight: 172 lb 13.5 oz (78.4 kg)     SpO2:  94% 90% 94%    Intake/Output Summary (Last 24 hours) at 08/28/15 1213 Last data filed at 08/28/15 0818  Gross per 24 hour  Intake    640 ml  Output   1645 ml  Net  -1005 ml   Filed Weights   08/24/15 1923 08/24/15 2331 08/28/15 0500  Weight: 177 lb (80.287 kg) 181 lb 3.5 oz (82.2 kg) 172 lb 13.5 oz (78.4 kg)    PHYSICAL EXAM General: Well developed, well nourished, male in no acute distress. Head: Normocephalic, atraumatic.  Neck: Supple without bruits, No JVD. Lungs:  Resp regular and unlabored, clear in upper lobes, rhonchi in bases.  Heart: RRR, S1, S2, no S3, S4, or murmur; no rub. Abdomen: Soft, non-tender, non-distended, BS + x 4.  Extremities: No clubbing, cyanosis, No edema.  Neuro: Alert and oriented X 3. Moves all extremities spontaneously. Psych: Normal affect.  LABS: CBC: Recent Labs  08/27/15 0725 08/28/15 0515  WBC 11.0* 10.2  HGB 12.8* 12.3*  HCT 36.4* 36.1*  MCV 90.5 91.2  PLT 486* 480*   INR:No results for input(s): INR in the last 72 hours. Basic Metabolic Panel: Recent Labs  08/27/15 0951 08/27/15 1813 08/28/15 0515  NA  --  140 142  K  --  3.6 3.3*  CL  --  103 103  CO2  --  27 28  GLUCOSE  --  127* 134*  BUN  --  26* 23*    CREATININE  --  1.07 0.96  CALCIUM  --  9.3 9.3  MG 2.1  --   --    Cardiac Enzymes: Recent Labs  08/25/15 1525  TROPONINI 0.77*   BNP:  B NATRIURETIC PEPTIDE  Date/Time Value Ref Range Status  08/24/2015 08:10 PM 1070.0* 0.0 - 100.0 pg/mL Final     Current facility-administered medications:  .  acetaminophen (TYLENOL) tablet 650 mg, 650 mg, Oral, Q6H PRN **OR** acetaminophen (TYLENOL) suppository 650 mg, 650 mg, Rectal, Q6H PRN, Fuller Planhristopher W Rice, MD .  aspirin EC tablet 81 mg, 81 mg, Oral, Daily, Fuller Planhristopher W Rice, MD, 81 mg at 08/28/15 0906 .  azithromycin (ZITHROMAX) tablet 500 mg, 500 mg, Oral, Q24H, Fuller Planhristopher W Rice, MD, 500 mg at 08/27/15 2138 .  carvedilol (COREG) tablet 12.5 mg, 12.5 mg, Oral, BID WC, Fuller Planhristopher W Rice, MD, 12.5 mg at 08/28/15 0906 .  cefTRIAXone (ROCEPHIN) 2 g in dextrose 5 % 50 mL IVPB, 2 g, Intravenous, Q24H, Fuller Planhristopher W Rice, MD, 2 g at 08/27/15 1859 .  dabigatran (PRADAXA) capsule 150 mg, 150 mg, Oral, Q12H, Valentino NoseNathan Boswell, MD, 150 mg at 08/28/15  0907 .  dextromethorphan-guaiFENesin (MUCINEX DM) 30-600 MG per 12 hr tablet 1 tablet, 1 tablet, Oral, BID, Fuller Plan, MD, 1 tablet at 08/28/15 0906 .  dorzolamide-timolol (COSOPT) 22.3-6.8 MG/ML ophthalmic solution 1 drop, 1 drop, Left Eye, BID, Fuller Plan, MD, 1 drop at 08/28/15 0907 .  furosemide (LASIX) injection 40 mg, 40 mg, Intravenous, Q8H, Valentino Nose, MD, 40 mg at 08/28/15 1610 .  guaiFENesin-dextromethorphan (ROBITUSSIN DM) 100-10 MG/5ML syrup 5 mL, 5 mL, Oral, Q4H PRN, Valentino Nose, MD .  ipratropium-albuterol (DUONEB) 0.5-2.5 (3) MG/3ML nebulizer solution 3 mL, 3 mL, Nebulization, TID, Fuller Plan, MD, 3 mL at 08/28/15 0747 .  potassium chloride SA (K-DUR,KLOR-CON) CR tablet 40 mEq, 40 mEq, Oral, Daily, Valentino Nose, MD, 40 mEq at 08/28/15 0906 .  pravastatin (PRAVACHOL) tablet 40 mg, 40 mg, Oral, QHS, Fuller Plan, MD, 40 mg at 08/27/15 2137 .  traZODone  (DESYREL) tablet 50 mg, 50 mg, Oral, QHS PRN, Valentino Nose, MD .  zolpidem [facility]) tablet 5 mg, 5 mg, Oral, Once, Fuller Plan, MD, 5 mg at 08/25/15 0138   TELE: NSR       Echo 08/25/15:  - Procedure narrative: Transthoracic echocardiography. Image  quality was adequate. The study was technically difficult. - Left ventricle: The cavity size was normal. There was mild focal  basal hypertrophy of the septum. Systolic function was normal.  Wall motion was normal; there were no regional wall motion  abnormalities. The study is not technically sufficient to allow  evaluation of LV diastolic function. - Aortic valve: Transvalvular velocity was within the normal range.  There was no stenosis. There was mild regurgitation. - Mitral valve: Calcified annulus. There was mild regurgitation. - Left atrium: The atrium was moderately dilated. - Right ventricle: The cavity size was normal. Wall thickness was  normal. Systolic function was normal. - Tricuspid valve: There was mild regurgitation. - Pulmonary arteries: Systolic pressure was moderately increased.  PA peak pressure: 49 mm Hg (S). - Inferior vena cava: The vessel was normal in size. The  respirophasic diameter changes were blunted (< 50%), consistent  with normal central venous pressure.     Current Medications:  . aspirin EC  81 mg Oral Daily  . azithromycin  500 mg Oral Q24H  . carvedilol  12.5 mg Oral BID WC  . cefTRIAXone (ROCEPHIN)  IV  2 g Intravenous Q24H  . dabigatran  150 mg Oral Q12H  . dextromethorphan-guaiFENesin  1 tablet Oral BID  . dorzolamide-timolol  1 drop Left Eye BID  . furosemide  40 mg Intravenous Q8H  . ipratropium-albuterol  3 mL Nebulization TID  . potassium chloride  40 mEq Oral Daily  . pravastatin  40 mg Oral QHS  . zolpidem  5 mg Oral Once      ASSESSMENT AND PLAN: Active Problems:   CAP (community acquired pneumonia)   Community acquired pneumonia   Elevated troponin    Hypoxia  1. PAF:In NSR now, rate controlled, likely that Afib is caused by acute PNA.  Pradaxa was restarted yesterday.   This patients CHA2DS2-VASc Score and unadjusted Ischemic Stroke Rate (% per year) is equal to 3.2 % stroke rate/year from a score of 3 Above score calculated as 1 point each if present [CHF, HTN, DM, Vascular=MI/PAD/Aortic Plaque, Age if 72-74, or Male], 2 points each if present [Age > 75, or Stroke/TIA/TE]  Dr. Royann Shivers spoke with patient's Cardiologist in [place], Dr. Melvyn Neth who is agreeable to plan of coronary  angiography when patient's PNA resolves. Patient states today that he would like cardiac catheterization done here at [facility] rather than at home.   2. Elevated Troponin: Patient has elevated troponin in setting of Afib and PNA, however he does have some EKG changes. Denies chest pain, see discussion above.   3. Community acquired PNA: Management per primary team, patient is weaning off O2, down to 4L today. Still SOB, says he cannot lie flat.    Signed, Little Ishikawa , NP 12:13 PM 08/28/2015 Pager [phone]  I have seen and examined the patient along with Little Ishikawa , NP.  I have reviewed the chart, notes and new data.  I agree with NP's note. Also discussed the current clinical scenario in detail with his Cardiologist in [place], Dr. Melvyn Neth by phone yesterday evening.  Key new complaints: remains dyspneic with activity, but improving daily. O2 down to 3L [place]. Key examination changes: now RRR with frequent PACs Key new findings / data: WBC now in normal range  PLAN:  Had a long discussion regarding the need for further coronary evaluation (concern that episode on May 2nd with ECG ST-T changes was due to unstable angina, prior to pneumonia). Again pointed out that evaluation should be soon, but not emergent. It should wait until he recovers from pneumonia, at least until he can lie flat for a few hours.  If there are new worrisome events for an  acute coronary syndrome or if he prefers, we can perform angiography here. Otherwise, Dr. Melvyn Neth was confident that he could arrange prompt follow up in [place]. Would plan to stop pradaxa at least 24-36h before cardiac cath.   Thurmon Fair, MD, [facility] [phone] 08/28/2015, 1:58 PM

## 2015-08-28 NOTE — Progress Notes (Signed)
Subjective: Ivan Mccoy was seen and examined on morning rounds.  He did well overnight and continues to be weaned off oxygen.  He was tolerating 5L yesterday, now able to tolerate 3L with sats around 95%.  He endorses a cough with associated SOB.  He feels his symptoms are slowly improving.  Denies CP, palpitations, diarrhea.  We discussed cardiology plan at length and he would prefer to be worked up at [facility] rather than being seen outpatient with Dr. Melvyn Neth in [place].  Cardiology will see him today to discuss plans moving forward.    Objective: Vital signs in last 24 hours: Filed Vitals:   08/28/15 0400 08/28/15 0500 08/28/15 0747 08/28/15 0800  BP: 134/63   159/82  Pulse: 86   87  Temp: 99.4 F (37.4 C)   98.3 F (36.8 C)  TempSrc: Oral   Oral  Resp: 29   23  Height:      Weight:  78.4 kg (172 lb 13.5 oz)    SpO2: 94%  94% 90%   Weight change:   Intake/Output Summary (Last 24 hours) at 08/28/15 0900 Last data filed at 08/28/15 0818  Gross per 24 hour  Intake   1397 ml  Output   1845 ml  Net   -448 ml      General: Alert, talkative. NAD. On nasal cannula - 3L Lungs: Wheezing in left lower lobe base during expiration. No crackles.  Heart: Regular rate and rhythm on exam. No murmurs. 2+ radial pulses bilaterally  Extremities: Warm, dry. 1++ bilateral lower extremity edema halfway up shin - improved since yesterday.      Lab Results: CBC    Component Value Date/Time   WBC 10.2 08/28/2015 0515   RBC 3.96* 08/28/2015 0515   HGB 12.3* 08/28/2015 0515   HCT 36.1* 08/28/2015 0515   PLT 480* 08/28/2015 0515   MCV 91.2 08/28/2015 0515   MCH 31.1 08/28/2015 0515   MCHC 34.1 08/28/2015 0515   RDW 12.6 08/28/2015 0515   LYMPHSABS 0.9 08/25/2015 0250   MONOABS 1.6* 08/25/2015 0250   EOSABS 0.0 08/25/2015 0250   BASOSABS 0.0 08/25/2015 0250   BMP Latest Ref Rng 08/28/2015 08/27/2015 08/27/2015  Glucose 65 - 99 mg/dL 161(W) 960(A) 540(J)  BUN 6 - 20 mg/dL 81(X)  91(Y) 78(G)  Creatinine 0.61 - 1.24 mg/dL 9.56 2.13 0.86  Sodium 135 - 145 mmol/L 142 140 141  Potassium 3.5 - 5.1 mmol/L 3.3(L) 3.6 3.0(L)  Chloride 101 - 111 mmol/L 103 103 101  CO2 22 - 32 mmol/L 28 27 24   Calcium 8.9 - 10.3 mg/dL 9.3 9.3 9.1   Mg - 2.1   Micro Results: Recent Results (from the past 240 hour(s))  Blood Culture (routine x 2)     Status: None (Preliminary result)   Collection Time: 08/24/15  7:22 PM  Result Value Ref Range Status   Specimen Description BLOOD LEFT HAND  Final   Special Requests IN PEDIATRIC BOTTLE 3CC  Final   Culture NO GROWTH 3 DAYS  Final   Report Status PENDING  Incomplete  Blood Culture (routine x 2)     Status: None (Preliminary result)   Collection Time: 08/24/15  8:11 PM  Result Value Ref Range Status   Specimen Description BLOOD LEFT ANTECUBITAL  Final   Special Requests BOTTLES DRAWN AEROBIC AND ANAEROBIC 5CC  Final   Culture NO GROWTH 3 DAYS  Final   Report Status PENDING  Incomplete  Urine culture  Status: None   Collection Time: 08/24/15 11:27 PM  Result Value Ref Range Status   Specimen Description URINE, RANDOM  Final   Special Requests NONE  Final   Culture NO GROWTH  Final   Report Status 08/26/2015 FINAL  Final  MRSA PCR Screening     Status: None   Collection Time: 08/24/15 11:28 PM  Result Value Ref Range Status   MRSA by PCR NEGATIVE NEGATIVE Final    Comment:        The GeneXpert MRSA Assay (FDA approved for NASAL specimens only), is one component of a comprehensive MRSA colonization surveillance program. It is not intended to diagnose MRSA infection nor to guide or monitor treatment for MRSA infections.   Culture, sputum-assessment     Status: None   Collection Time: 08/25/15  2:50 PM  Result Value Ref Range Status   Specimen Description SPUTUM  Final   Special Requests Normal  Final   Sputum evaluation   Final    THIS SPECIMEN IS ACCEPTABLE. RESPIRATORY CULTURE REPORT TO FOLLOW.   Report Status  08/25/2015 FINAL  Final  Culture, respiratory (NON-Expectorated)     Status: None   Collection Time: 08/25/15  3:09 PM  Result Value Ref Range Status   Specimen Description SPUTUM  Final   Special Requests NONE  Final   Gram Stain   Final    MODERATE WBC PRESENT,BOTH PMN AND MONONUCLEAR FEW SQUAMOUS EPITHELIAL CELLS PRESENT MODERATE GRAM POSITIVE COCCI IN PAIRS MODERATE GRAM NEGATIVE RODS THIS SPECIMEN IS ACCEPTABLE FOR SPUTUM CULTURE Performed at [facility]    Culture   Final    NORMAL OROPHARYNGEAL FLORA Performed at [facility]    Report Status 08/28/2015 FINAL  Final   Studies/Results: No results found. Medications: I have reviewed the patient's current medications. Scheduled Meds: . aspirin EC  81 mg Oral Daily  . azithromycin  500 mg Oral Q24H  . carvedilol  12.5 mg Oral BID WC  . cefTRIAXone (ROCEPHIN)  IV  2 g Intravenous Q24H  . dabigatran  150 mg Oral Q12H  . dextromethorphan-guaiFENesin  1 tablet Oral BID  . dorzolamide-timolol  1 drop Left Eye BID  . furosemide  40 mg Intravenous Q8H  . ipratropium-albuterol  3 mL Nebulization TID  . potassium chloride  40 mEq Oral Daily  . pravastatin  40 mg Oral QHS  . zolpidem  5 mg Oral Once   Continuous Infusions:  PRN Meds:.acetaminophen **OR** acetaminophen, guaiFENesin-dextromethorphan, traZODone Assessment/Plan: Shortness of breath Sputum culture grew G(+) cocci in pairs - highly suggestive of strep pneumo PNA.  Also growing gram (-) rods. WBC continue to trend downward, pt remains afebrile. Currently on Day 4  of azithromycin + ceftriaxone. Pt tolerating nasal cannula at 3L, down from 5L yesterday.  Lower extremity edema has improved since upping to Lasix 40mg  TID yesterday.  Daily weights began being recorded today. -Oxygen therapy as needed goal SpO2 >92%. Currently on 3L. Wean O2 as able.  -Ceftriaxone 2g IV qday (Day 4) -Azithromycin 500mg  qday (Day 4) -Mucinex bid -Nebulizer  tid -Lasix to 40mg  tid  -daily weights   -PT consult   HFpEF: Denies any chest pain at this time. Has been in and out of a-fib, a-flutter, PACs.  Rate has been well-controlled in the 80s. Cardiology consulted and cannot rule out prior ischemic episode at this time.  Started pt on Pradaxa yesterday and discontinued heparin.  Is on Lasix tid for LE edema, which is improving.  He has given more thought to cardiology workup (inpatient cath/angio vs. Outpatient CT angio) and is leaning towards inpatient workup.  Will need to discuss options further with cards who can recommend plan.   Pt to be transferred to floor bed with tele this AM.   -Cardiology following patient, appreciate help   -Pradaxa 150 mg bid  -EKG today -ASA  -K for hypokalemia  Hypertension: BP well-controlled in 130s-140s.  -Coreg 12.5 mg bid   This is a Psychologist, occupational Note.  The care of the patient was discussed with Dr. Valentino Nose and the assessment and plan formulated with their assistance.  Please see their attached note for official documentation of the daily encounter.   LOS: 4 days   Nathaneil Canary, Med Student 08/28/2015, 9:00 AM

## 2015-08-28 NOTE — Progress Notes (Signed)
Subjective: No events overnight. Reports breathing is slowly improving. Tolerating 3L O2 and maintain sats this morning. Continues to have cough with mucous production. Scant blood. Denies any chest pain or palpations.    Objective: Vital signs in last 24 hours: Filed Vitals:   08/28/15 0500 08/28/15 0747 08/28/15 0800 08/28/15 1000  BP:   159/82 135/82  Pulse:   87 85  Temp:   98.3 F (36.8 C)   TempSrc:   Oral   Resp:   23 18  Height:      Weight: 172 lb 13.5 oz (78.4 kg)     SpO2:  94% 90% 94%   Weight change:   Intake/Output Summary (Last 24 hours) at 08/28/15 1338 Last data filed at 08/28/15 0818  Gross per 24 hour  Intake    640 ml  Output   1395 ml  Net   -755 ml    General: alert, well-developed, and cooperative to examination.  Neck: supple, full ROM, no carotid bruits. No JVD appreciated.  Lungs: normal respiratory effort, no accessory muscle use, mild wheezes L>R, no crackles  Heart: tachycardic, regular rhythm with occasional irregular beat, no murmur, no gallop, and no rub.  Abdomen: soft, non-tender, normal bowel sounds, no distention Pulses: 2+ DP/PT pulses bilaterally Extremities: No cyanosis, worsened edema b/l LE, 1+ pitting edema, improving Psych: normal mood and affect  Medications: I have reviewed the patient's current medications. Scheduled Meds: . aspirin EC  81 mg Oral Daily  . azithromycin  500 mg Oral Q24H  . carvedilol  12.5 mg Oral BID WC  . cefTRIAXone (ROCEPHIN)  IV  2 g Intravenous Q24H  . dabigatran  150 mg Oral Q12H  . dextromethorphan-guaiFENesin  1 tablet Oral BID  . dorzolamide-timolol  1 drop Left Eye BID  . furosemide  40 mg Intravenous Q8H  . ipratropium-albuterol  3 mL Nebulization TID  . potassium chloride  40 mEq Oral Daily  . pravastatin  40 mg Oral QHS  . zolpidem  5 mg Oral Once   Continuous Infusions:   PRN Meds:.acetaminophen **OR** acetaminophen, guaiFENesin-dextromethorphan, traZODone Assessment/Plan:  CAP:  Patient remains afebrile. Leukocytosis improving. Coming down on O2 requirements, tolerating 3L [place] this morning. Sputum culture growing normal flora. This is likely strep pneumo infection improving with Abx. Currently on day 4 of Ceftriaxone and Azithromycin. Will continue antibiotics and try to wean off O2 as tolerated. Scattered wheezing but no crackles on exam.  -Oxygen therapy as needed goal SpO2 >92% -Repeat AM Bmet, CBC -Ceftriaxone 2g IV 5/15>>> -Azithromycin  5/15>>> -Lasix 40 mg IV TID  -Robitussin DM prn -Tylenol prn -F/U BCx > NGTD -F/U Sputum Cx > Normal Flora -PT eval -Transfer to floor  PAF: CHAD-VASc 3. History of A. Fib s/p ablation. Review of telemetry shows recurrent a. Fib with frequent PACs. Evaluated by cardiology. Concern for acute pneumonia and a.fib causing new ischemia. Previous false positive stress tests with negative caths make him a poor candidate for nuclear stress test. Likely that a. Fib is precipiated by his acute infection. Will need some ischemic work up. Patient has expressed he would like his cardiac care done at [facility]. Trying to contact his cardiologist in [place] to discuss.  -Appreciate cardiology help -Contact patient's Cardiologist in [place], [place] Vic Ripper, MD, [phone], nurse Rayburn Felt). -stop heparin gtt -re-start dabigatran (previously on, stopped 6 months ago) - lasix 40 tid  Hypokalemia: K 3.4 this morning. Has been receiving lasix during hospitalization. Replacing with Oral K as  needed. -KDur 40 mEq daily -Mg 2.1  HFpEF: [facility] and LVH without significantly reduced EF. Previous stress studies consistently false positives with negative subsequent caths so likely NICM. ECHO done yesterday with no LV dysfunction but unable to determine EF. Unable to determine if diastolic dysfunction present. PAP mildly elevated at 49. May need repeat ECHO prior to  discharge when respiratory status improved and patient able to tolerate test better.  -ASA 81mg  -cardiology consulted, appreciate the consult -Continue home Coreg 12.5mg  BID -Lasix 40 mg IV TID  HTN: On many agents PTA amlodipine 10mg , clonidine 0.1mg  TID, eplerenone 50mg , losartan 50mg . Will hold these. BP labile. -Consider restarting home meds if BP remains elevated -Continue Coreg  HLD: Stable chronic problem. - Continue home pravastatin 40mg , fenofibrate 160mg .  OSA:. qHS CPAP.  FULL CODE  Diet: Heart Healthy  VTE ppx: [place] enoxaparin  Dispo: Disposition is deferred at this time, awaiting improvement of current medical problems.    The patient does have a current PCP (No primary care provider on file.) and [facility] after discharge.  The patient does not have transportation limitations that hinder transportation to clinic appointments.  .Services Needed at time of discharge: Y = Yes, Blank = No PT:   OT:   RN:   Equipment:   Other:     LOS: 4 days   Ivan Mccoy Glade, MD IMTS PGY-1 [phone] 08/28/2015, 1:38 PM

## 2015-08-28 NOTE — Procedures (Signed)
Pt does not wish to wear cpap tonight would like to wear the nasal cannula instead.

## 2015-08-28 NOTE — Progress Notes (Signed)
Patient seen and examined. Case d/w residents in detail. I agree with findings and plan as documented in Dr. Bonney RousselBoswell's note.  Patient continues to improve and has now been weaned down to 3 L [place]. Blood cx remain negative. Sputum cx with normal oropharyngeal flora. Will continue to attempt to wean down O2. Continue with IV rocephin and azithromycin. Will consider switching to PO abx in AM. PT to follow up. Patient is stable for transfer to floor.  Cardio f/u appreciated. Patient with recurrent afib on admission. Now in NSR. Cardio following -  Patient will need cath for w/u of possible unstable angina given EKG changes noted on May 2nd. This is not emergent but will need to be done when he is able to lay flat for a few hours. Patient to decide if he would like to do this here or in [place] with his cardiologist. C/w pradaxa. Hold for 24-36 hours prior to cath.

## 2015-08-29 DIAGNOSIS — J9601 Acute respiratory failure with hypoxia: Secondary | ICD-10-CM

## 2015-08-29 DIAGNOSIS — R778 Other specified abnormalities of plasma proteins: Secondary | ICD-10-CM

## 2015-08-29 LAB — CBC
HEMATOCRIT: 34.4 % — AB (ref 39.0–52.0)
HEMOGLOBIN: 11.8 g/dL — AB (ref 13.0–17.0)
MCH: 31.1 pg (ref 26.0–34.0)
MCHC: 34.3 g/dL (ref 30.0–36.0)
MCV: 90.5 fL (ref 78.0–100.0)
Platelets: 466 10*3/uL — ABNORMAL HIGH (ref 150–400)
RBC: 3.8 MIL/uL — AB (ref 4.22–5.81)
RDW: 12.6 % (ref 11.5–15.5)
WBC: 11.8 10*3/uL — AB (ref 4.0–10.5)

## 2015-08-29 LAB — BASIC METABOLIC PANEL
Anion gap: 11 (ref 5–15)
BUN: 22 mg/dL — ABNORMAL HIGH (ref 6–20)
CHLORIDE: 100 mmol/L — AB (ref 101–111)
CO2: 28 mmol/L (ref 22–32)
CREATININE: 0.93 mg/dL (ref 0.61–1.24)
Calcium: 9.4 mg/dL (ref 8.9–10.3)
GFR calc non Af Amer: >60 mL/min (ref >60)
Glucose, Bld: 141 mg/dL — ABNORMAL HIGH (ref 65–99)
POTASSIUM: 3.3 mmol/L — AB (ref 3.5–5.1)
Sodium: 139 mmol/L (ref 135–145)

## 2015-08-29 LAB — CULTURE, BLOOD (ROUTINE X 2)
CULTURE: NO GROWTH
Culture: NO GROWTH

## 2015-08-29 LAB — PROCALCITONIN: Procalcitonin: 0.13 ng/mL

## 2015-08-29 MED ORDER — POTASSIUM CHLORIDE CRYS ER 20 MEQ PO TBCR
40.0000 meq | EXTENDED_RELEASE_TABLET | Freq: Two times a day (BID) | ORAL | Status: DC
  Administered 2015-08-29 – 2015-08-30 (×2): 40 meq via ORAL
  Filled 2015-08-29 (×2): qty 2

## 2015-08-29 MED ORDER — POTASSIUM CHLORIDE CRYS ER 20 MEQ PO TBCR
40.0000 meq | EXTENDED_RELEASE_TABLET | Freq: Once | ORAL | Status: AC
  Administered 2015-08-29: 40 meq via ORAL
  Filled 2015-08-29: qty 2

## 2015-08-29 NOTE — Progress Notes (Signed)
Patient refused use of CPAP at this time. RT will continue to monitor. 

## 2015-08-29 NOTE — Evaluation (Signed)
Physical Therapy Evaluation Patient Details Name: Ivan ArisCharles Hardman MRN: [id number] DOB: 11/23/1935 Today's Date: 08/29/2015   History of Present Illness  Patient is a 80 yo male admitted 08/24/15 with SOB, fever, hypoxia, elevated troponin.  Patient with CAP, PAF, and awaiting cardiac cath.    PMH:  HTN, OSA, CHF, PAF  Clinical Impression  Patient presents with problems listed below.  Will benefit from acute PT to maximize functional independence prior to discharge.      Follow Up Recommendations No PT follow up;Supervision for mobility/OOB    Equipment Recommendations  None recommended by PT    Recommendations for Other Services       Precautions / Restrictions Precautions Precautions: None Precaution Comments: On O2 Restrictions Weight Bearing Restrictions: No      Mobility  Bed Mobility               General bed mobility comments: Patient in chair as PT entered room  Transfers Overall transfer level: Modified independent Equipment used: None             General transfer comment: Increased time  Ambulation/Gait Ambulation/Gait assistance: Min guard Ambulation Distance (Feet): 30 Feet Assistive device: None Gait Pattern/deviations: Step-through pattern;Decreased stride length Gait velocity: decreased Gait velocity interpretation: Below normal speed for age/gender General Gait Details: Slow, fairly steady gait.  Ambulating on O2.  Fatigues quickly with DOE.  Stairs            Wheelchair Mobility    Modified Rankin (Stroke Patients Only)       Balance                                             Pertinent Vitals/Pain Pain Assessment: No/denies pain    Home Living Family/patient expects to be discharged to:: Private residence Living Arrangements: Spouse/significant other;Children Available Help at Discharge: Family;Available 24 hours/day Type of Home: House Home Access: Stairs to enter Entrance Stairs-Rails:  Doctor, general practiceight;Left Entrance Stairs-Number of Steps: 3 Home Layout: Able to live on main level with bedroom/bathroom Home Equipment: Hand held shower head      Prior Function Level of Independence: Independent               Hand Dominance        Extremity/Trunk Assessment   Upper Extremity Assessment: Overall WFL for tasks assessed           Lower Extremity Assessment: Overall WFL for tasks assessed         Communication   Communication: No difficulties  Cognition Arousal/Alertness: Awake/alert Behavior During Therapy: WFL for tasks assessed/performed Overall Cognitive Status: Within Functional Limits for tasks assessed                      General Comments      Exercises        Assessment/Plan    PT Assessment Patient needs continued PT services  PT Diagnosis Abnormality of gait   PT Problem List Decreased activity tolerance;Decreased balance;Decreased mobility;Cardiopulmonary status limiting activity  PT Treatment Interventions Gait training;Stair training;Functional mobility training;Therapeutic activities;Patient/family education   PT Goals (Current goals can be found in the Care Plan section) Acute Rehab PT Goals Patient Stated Goal: To get cardiac cath done PT Goal Formulation: With patient/family Time For Goal Achievement: 09/05/15 Potential to Achieve Goals: Good    Frequency Min 3X/week   Barriers  to discharge        Co-evaluation               End of Session Equipment Utilized During Treatment: Oxygen Activity Tolerance: Patient tolerated treatment well;Patient limited by fatigue Patient left: in chair;with call bell/phone within reach;with family/visitor present Nurse Communication: Mobility status         Time: 1756-1811 PT Time Calculation (min) (ACUTE ONLY): 15 min   Charges:   PT Evaluation $PT Eval Moderate Complexity: 1 Procedure     PT G CodesVena Austria 2015-09-13, 8:28 PM Durenda Hurt. Renaldo Fiddler, [facility] Acute Rehab Services Pager [phone]

## 2015-08-29 NOTE — Progress Notes (Signed)
Internal Medicine Attending:   I saw and examined the patient. I reviewed the resident's note and I agree with the resident's findings and plan as documented in the resident's note. [age] year old man with no underlying lung disease admitted with community-acquired pneumonia causing acute hypoxic respiratory failure. He's responding well to antibiotics, was up in the chair on her rounds this morning. His lungs sound largely clear with no crackles. Supplemental oxygen is being weaned per protocol. I agree with transitioning antibiotics to oral azithromycin and cephalosporin. He has plans with cardiology for left heart catheterization on Monday.

## 2015-08-29 NOTE — Progress Notes (Signed)
Patient ID: Marsa ArisCharles Swider, male   DOB: 07/27/1935, 80 y.o.   MRN: [id number]  I was paged around 10pm for an asymptomatic pulsatile bulge on Mr. Waiters's right neck. His wife and daughter noticed the bulging for about 5 minutes but the patient felt completely fine. When I arrived to the room, the bulging had abated and his neck looked completely normal, without JVD, with carotid pulses 2+ at normal rate and rhythm, without a mass. His daughter showed me a video on her phone of a slow, rhythmic pulsatile mass directly anterior to his trapezius muscle. This was beating around 30-40bpm based off my rough estimate. I checked the telemetry and his heart was beating at 80bpm during that time. My best guess is this was a rhythmic muscle spasm of one his scalene muscles. I don't think this was vascular in nature because of the anatomic location, pulse discrepancy, and absence of symptoms.

## 2015-08-29 NOTE — Progress Notes (Signed)
Subjective: No events overnight. Reports breathing is slowly improving. Tolerating 3L O2 and maintain sats this morning. Continues to have cough with mucous production. Denies any chest pain or palpations.    Objective: Vital signs in last 24 hours: Filed Vitals:   08/28/15 2042 08/29/15 0536 08/29/15 0945 08/29/15 1001  BP:  133/60 146/65   Pulse:  81 82   Temp:  98.5 F (36.9 C) 98.4 F (36.9 C)   TempSrc:  Oral Oral   Resp:  18 18   Height:      Weight:      SpO2: 96% 96% 98% 94%   Weight change: 5.8 oz (0.163 kg)  Intake/Output Summary (Last 24 hours) at 08/29/15 1225 Last data filed at 08/29/15 0900  Gross per 24 hour  Intake   1250 ml  Output   1475 ml  Net   -225 ml    General: alert, well-developed, and cooperative to examination.  Neck: supple, full ROM, no carotid bruits. No JVD appreciated.  Lungs: normal respiratory effort, no accessory muscle use, no crackles or wheezing Heart: mildly tachycardic, regular rhythm with occasional irregular beat, no murmur, no gallop, and no rub.  Abdomen: soft, non-tender, normal bowel sounds, no distention Pulses: 2+ DP/PT pulses bilaterally Extremities: No cyanosis, worsened edema b/l LE, 1+ pitting edema, improving Psych: normal mood and affect  Medications: I have reviewed the patient's current medications. Scheduled Meds: . aspirin EC  81 mg Oral Daily  . carvedilol  12.5 mg Oral BID WC  . cefTRIAXone (ROCEPHIN)  IV  2 g Intravenous Q24H  . dabigatran  150 mg Oral Q12H  . dextromethorphan-guaiFENesin  1 tablet Oral BID  . dorzolamide-timolol  1 drop Left Eye BID  . furosemide  40 mg Intravenous Q8H  . ipratropium-albuterol  3 mL Nebulization TID  . potassium chloride  40 mEq Oral Daily  . pravastatin  40 mg Oral QHS  . zolpidem  5 mg Oral Once   Continuous Infusions:   PRN Meds:.acetaminophen **OR** acetaminophen, guaiFENesin-dextromethorphan, ipratropium-albuterol, traZODone Assessment/Plan:  CAP: Patient  remains afebrile. Leukocytosis improving. Coming down on O2 requirements, tolerating 3L [place] this morning. Sputum culture growing normal flora. This is likely strep pneumo infection improving with Abx. Currently on day 5 of Ceftriaxone and Azithromycin. Will stop Azithromycin after 5 day course. Continue Cephalosporin for 10 day course. Lungs clear no exam today.  -Oxygen therapy as needed goal SpO2 >90% -Repeat AM Bmet, CBC -Ceftriaxone 2g IV 5/15>>> -Azithromycin 500mg  5/15>>>5/19 -Lasix 40 mg IV TID  -Robitussin DM prn -Tylenol prn -F/U BCx > NG Final -F/U Sputum Cx > Normal Flora -PT eval  PAF: CHAD-VASc 3. History of A. Fib s/p ablation. Currently with PAF. Started Pradaxa. Rate controlled with Coreg.  -Appreciate cardiology help -continue dabigatran (previously on, stopped 6 months ago) - lasix 40 IV tid  Elevated Troponin: Peaked at 1.15 at admission. In setting of A.Fib and pneumonia. Possible occult MI 2 weeks ago with ST inversions on EKG. Plan for cath once PNA is improved. Will need to hold pradaxa tomorrow with likely cath on Monday.  -Appreciate cardiology help  Hypokalemia: K 3.3 this morning. Has been receiving lasix during hospitalization. Replacing with Oral K as needed. -KDur 40 mEq bid  HFpEF: [facility] and LVH without significantly reduced EF. Previous stress studies consistently false positives with negative subsequent caths so likely NICM. ECHO done yesterday with no LV dysfunction but unable to  determine EF. Unable to determine if diastolic dysfunction present. PAP mildly elevated at 49. May need repeat ECHO prior to discharge when respiratory status improved and patient able to tolerate test better.  -ASA  -cardiology consulted, appreciate the consult -Continue home Coreg 12.5mg  BID -Lasix 40 mg IV TID  HTN: On many agents PTA amlodipine , clonidine 0.1mg  TID, eplerenone , losartan  . Will hold these. BP labile. -Consider restarting home meds if BP remains elevated -Continue Coreg  HLD: Stable chronic problem. - Continue home pravastatin , fenofibrate .  OSA:. qHS CPAP.  FULL CODE  Diet: Heart Healthy  VTE ppx: [place] enoxaparin  Dispo: Disposition is deferred at this time, awaiting improvement of current medical problems.    The patient does have a current PCP (No primary care provider on file.) and does need an [facility] hospital follow-up appointment after discharge.  The patient does not have transportation limitations that hinder transportation to clinic appointments.  .Services Needed at time of discharge: Y = Yes, Blank = No PT:   OT:   RN:   Equipment:   Other:     LOS: 5 days   Valentino Nose, MD IMTS PGY-1 [phone] 08/29/2015, 12:25 PM

## 2015-08-29 NOTE — Progress Notes (Signed)
Patient ID: Ivan Mccoy, male   DOB: 29-Apr-1935, 80 y.o.   MRN: [id number]     Subjective:    Still with some SOB. No chest pain.   Objective:   Temp:  [98.1 F (36.7 C)-99.1 F (37.3 C)] 98.5 F (36.9 C) (05/20 0536) Pulse Rate:  [81-87] 81 (05/20 0536) Resp:  [18-23] 18 (05/20 0536) BP: (130-159)/(57-82) 133/60 mmHg (05/20 0536) SpO2:  [90 %-96 %] 96 % (05/20 0536) Weight:  [173 lb 3.2 oz (78.563 kg)] 173 lb 3.2 oz (78.563 kg) (05/19 2001) Last BM Date: 08/28/15  Filed Weights   08/24/15 2331 08/28/15 0500 08/28/15 2001  Weight: 181 lb 3.5 oz (82.2 kg) 172 lb 13.5 oz (78.4 kg) 173 lb 3.2 oz (78.563 kg)    Intake/Output Summary (Last 24 hours) at 08/29/15 0726 Last data filed at 08/29/15 0600  Gross per 24 hour  Intake    890 ml  Output   1325 ml  Net   -435 ml    Telemetry: SR  Exam:  General: NAD  HEENT: sclera clear, throat clear  Resp: CTAB  Cardiac: RRR, 3/6 systolic murmur at apex, no jvdf  GI: abdomen soft, NT, ND  MSK: no LE edema  Neuro: no focal deficits  Psych: appropriate affect  Lab Results:  Basic Metabolic Panel:  Recent Labs Lab 08/27/15 0951 08/27/15 1813 08/28/15 0515 08/29/15 0514  NA  --  140 142 139  K  --  3.6 3.3* 3.3*  CL  --  103 103 100*  CO2  --  GLUCOSE  --  127* 134* 141*  BUN  --  26* 23* 22*  CREATININE  --  1.07 0.96 0.93  CALCIUM  --  9.3 9.3 9.4  MG 2.1  --   --   --     Liver Function Tests:  Recent Labs Lab 08/24/15 1941  AST 26  ALT 15*  ALKPHOS 35*  BILITOT 1.7*  PROT 6.7  ALBUMIN 3.5    CBC:  Recent Labs Lab 08/27/15 0725 08/28/15 0515 08/29/15 0514  WBC 11.0* 10.2 11.8*  HGB 12.8* 12.3* 11.8*  HCT 36.4* 36.1* 34.4*  MCV 90.5 91.2 90.5  PLT 486* 480* 466*    Cardiac Enzymes:  Recent Labs Lab 08/25/15 0306 08/25/15 0746 08/25/15 1525  TROPONINI 1.15* 0.90* 0.77*    BNP: No results for input(s): PROBNP in the last 8760 hours.  Coagulation: No results for  input(s): INR in the last 168 hours.  ECG:   Medications:   Scheduled Medications: . aspirin EC  81 mg Oral Daily  . azithromycin  500 mg Oral Q24H  . carvedilol  12.5 mg Oral BID WC  . cefTRIAXone (ROCEPHIN)  IV  2 g Intravenous Q24H  . dabigatran  150 mg Oral Q12H  . dextromethorphan-guaiFENesin  1 tablet Oral BID  . dorzolamide-timolol  1 drop Left Eye BID  . furosemide  40 mg Intravenous Q8H  . ipratropium-albuterol  3 mL Nebulization TID  . potassium chloride  40 mEq Oral Daily  . potassium chloride  40 mEq Oral Once  . pravastatin  40 mg Oral QHS  . zolpidem  5 mg Oral Once     Infusions:     PRN Medications:  acetaminophen **OR** acetaminophen, guaiFENesin-dextromethorphan, ipratropium-albuterol, traZODone     Assessment/Plan    1. PAF - rate control with coreg, stroke prevention with pradaxa. CHADS2Vasc score of 3  2. Elevated troponin - peak trop 1.15 in  setting of afib with RVR and pneumonia - echo with LVEF "normal systolic function", no LVEF given. No WMAs.  - from Dr Erin Hearingroitoru's note discussed case with patient and patient's primary cardiolgist, plan is for cath when pneumonia has improved. Will need to hold pradaxa starting tomorrow in plans for likely cath Monday. - he is on ASA, statin, beta blocker. ARB on hold due to labile bp's, likely resume as numbers stabilize.  - possible cath Monday depending on respiratory status, he still remains SOB on 4L [place] today. Can continue IV lasix at this time as some fluid overload is likely contributing.  3. Pneumonia - abx per primary team       Dina RichJonathan Lenus Trauger, M.D.

## 2015-08-30 LAB — BASIC METABOLIC PANEL
ANION GAP: 13 (ref 5–15)
BUN: 23 mg/dL — AB (ref 6–20)
CALCIUM: 9.8 mg/dL (ref 8.9–10.3)
CO2: 27 mmol/L (ref 22–32)
Chloride: 99 mmol/L — ABNORMAL LOW (ref 101–111)
Creatinine, Ser: 1.13 mg/dL (ref 0.61–1.24)
GFR calc Af Amer: >60 mL/min (ref >60)
GLUCOSE: 141 mg/dL — AB (ref 65–99)
POTASSIUM: 3.8 mmol/L (ref 3.5–5.1)
SODIUM: 139 mmol/L (ref 135–145)

## 2015-08-30 LAB — CBC
HCT: 35.6 % — ABNORMAL LOW (ref 39.0–52.0)
Hemoglobin: 11.9 g/dL — ABNORMAL LOW (ref 13.0–17.0)
MCH: 30.7 pg (ref 26.0–34.0)
MCHC: 33.4 g/dL (ref 30.0–36.0)
MCV: 92 fL (ref 78.0–100.0)
PLATELETS: 498 10*3/uL — AB (ref 150–400)
RBC: 3.87 MIL/uL — AB (ref 4.22–5.81)
RDW: 12.7 % (ref 11.5–15.5)
WBC: 11.7 10*3/uL — AB (ref 4.0–10.5)

## 2015-08-30 MED ORDER — SODIUM CHLORIDE 0.9 % IV SOLN: 250.0000 mL | INTRAVENOUS | Status: DC | PRN

## 2015-08-30 MED ORDER — CARVEDILOL 6.25 MG PO TABS
18.7500 mg | ORAL_TABLET | Freq: Two times a day (BID) | ORAL | Status: DC
  Administered 2015-08-30 – 2015-09-03 (×8): 18.75 mg via ORAL
  Filled 2015-08-30 (×9): qty 1

## 2015-08-30 MED ORDER — ASPIRIN 81 MG PO CHEW
81.0000 mg | CHEWABLE_TABLET | ORAL | Status: AC
  Administered 2015-08-31: 81 mg via ORAL
  Filled 2015-08-30: qty 1

## 2015-08-30 MED ORDER — SODIUM CHLORIDE 0.9% FLUSH: 3.0000 mL | INTRAVENOUS | Status: DC | PRN

## 2015-08-30 MED ORDER — SODIUM CHLORIDE 0.9% FLUSH
3.0000 mL | Freq: Two times a day (BID) | INTRAVENOUS | Status: DC
  Administered 2015-08-30 – 2015-08-31 (×2): 3 mL via INTRAVENOUS

## 2015-08-30 MED ORDER — POTASSIUM CHLORIDE CRYS ER 20 MEQ PO TBCR
40.0000 meq | EXTENDED_RELEASE_TABLET | Freq: Every day | ORAL | Status: DC
  Administered 2015-08-31 – 2015-09-03 (×4): 40 meq via ORAL
  Filled 2015-08-30 (×4): qty 2

## 2015-08-30 MED ORDER — FUROSEMIDE 40 MG PO TABS
40.0000 mg | ORAL_TABLET | Freq: Two times a day (BID) | ORAL | Status: DC
  Administered 2015-08-30 – 2015-08-31 (×2): 40 mg via ORAL
  Filled 2015-08-30 (×2): qty 1

## 2015-08-30 MED ORDER — LOSARTAN POTASSIUM 25 MG PO TABS
25.0000 mg | ORAL_TABLET | Freq: Every day | ORAL | Status: DC
  Administered 2015-08-30 – 2015-09-01 (×3): 25 mg via ORAL
  Filled 2015-08-30 (×3): qty 1

## 2015-08-30 NOTE — Progress Notes (Signed)
Patient ID: Marsa ArisCharles Ucci, male   DOB: 03/25/1936, 80 y.o.   MRN: [id number] Patient ID: Marsa ArisCharles Sawtelle, male   DOB: 08/05/1935, 80 y.o.   MRN: [id number]     Subjective:    Still with some SOB but improving  Objective:   Temp:  [98.3 F (36.8 C)-98.7 F (37.1 C)] 98.7 F (37.1 C) (05/21 0443) Pulse Rate:  [74-89] 74 (05/21 0443) Resp:  [18-21] 20 (05/21 0443) BP: (123-146)/(50-68) 128/60 mmHg (05/21 0443) SpO2:  [93 %-99 %] 99 % (05/21 0443) Weight:  [171 lb 4.8 oz (77.701 kg)] 171 lb 4.8 oz (77.701 kg) (05/21 0125) Last BM Date: 08/29/15  Filed Weights   08/28/15 2001 08/29/15 2122 08/30/15 0125  Weight: 173 lb 3.2 oz (78.563 kg) 171 lb 4.8 oz (77.701 kg) 171 lb 4.8 oz (77.701 kg)    Intake/Output Summary (Last 24 hours) at 08/30/15 0719 Last data filed at 08/30/15 0541  Gross per 24 hour  Intake    890 ml  Output   1965 ml  Net  -1075 ml    Telemetry: SR, short run of PSVT  Exam:  General: NAD  HEENT: sclera clear, throat clear  Resp: CTAB  Cardiac: RRR, 3/6 systolic murmur at apex, no jvd  GI: abdomen soft, NT, ND  MSK: no LE edema  Neuro: no focal deficits  Psych: appropriate affect  Lab Results:  Basic Metabolic Panel:  Recent Labs Lab 08/27/15 0951 08/27/15 1813 08/28/15 0515 08/29/15 0514  NA  --  140 142 139  K  --  3.6 3.3* 3.3*  CL  --  103 103 100*  CO2  --  27 28 28   GLUCOSE  --  127* 134* 141*  BUN  --  26* 23* 22*  CREATININE  --  1.07 0.96 0.93  CALCIUM  --  9.3 9.3 9.4  MG 2.1  --   --   --     Liver Function Tests:  Recent Labs Lab 08/24/15 1941  AST 26  ALT 15*  ALKPHOS 35*  BILITOT 1.7*  PROT 6.7  ALBUMIN 3.5    CBC:  Recent Labs Lab 08/27/15 0725 08/28/15 0515 08/29/15 0514  WBC 11.0* 10.2 11.8*  HGB 12.8* 12.3* 11.8*  HCT 36.4* 36.1* 34.4*  MCV 90.5 91.2 90.5  PLT 486* 480* 466*    Cardiac Enzymes:  Recent Labs Lab 08/25/15 0306 08/25/15 0746 08/25/15 1525  TROPONINI 1.15* 0.90* 0.77*     BNP: No results for input(s): PROBNP in the last 8760 hours.  Coagulation: No results for input(s): INR in the last 168 hours.  ECG:   Medications:   Scheduled Medications: . aspirin EC  81 mg Oral Daily  . carvedilol  12.5 mg Oral BID WC  . cefTRIAXone (ROCEPHIN)  IV  2 g Intravenous Q24H  . dabigatran  150 mg Oral Q12H  . dextromethorphan-guaiFENesin  1 tablet Oral BID  . dorzolamide-timolol  1 drop Left Eye BID  . furosemide  40 mg Intravenous Q8H  . ipratropium-albuterol  3 mL Nebulization TID  . potassium chloride  40 mEq Oral BID  . pravastatin  40 mg Oral QHS  . zolpidem  5 mg Oral Once    Infusions:    PRN Medications: acetaminophen **OR** acetaminophen, guaiFENesin-dextromethorphan, ipratropium-albuterol, traZODone     Assessment/Plan    1. PAF - rate control with coreg, stroke prevention with pradaxa. CHADS2Vasc score of 3  2. Elevated troponin - peak trop 1.15 in setting of afib with  RVR and pneumonia - echo with LVEF "normal systolic function", no LVEF given. No WMAs.  - from Dr Erin Hearing note discussed case with patient and patient's primary cardiolgist, plan is for cath when pneumonia has improved. Will need to hold pradaxa starting tomorrow in plans for likely cath Monday. - he is on ASA, statin, beta blocker. We will restart his home losartan at a lower dose of  daily.   Can continue IV lasix at this time as some fluid overload is likely contributing to his SOB. F/u labs today.   3. Pneumonia - abx per primary team  4. PSVT - increase coreg to 18.75mg  bid  I discussed with the patient the potential risks of cardiac cath including bleeding risk, risk of arrhythmia, and risk of inducing an MI. He is agreement for procedure, will plan for tomorrow.     Dina Rich, M.D.

## 2015-08-30 NOTE — Progress Notes (Signed)
Internal Medicine Attending:   I saw and examined the patient. I reviewed the resident's note and I agree with the resident's findings and plan as documented in the resident's note.  80 year old man recovering from community-acquired pneumonia. He still has acute hypoxic respiratory failure and we are weaning supplemental oxygen. Today he is on 2 L at rest via nasal cannula and saturating 95%. I think he will be ready for left heart catheterization tomorrow with cardiology from a respiratory standpoint.  we will check his inventory saturations prior to discharge is anticipated he will likely need a few weeks of supplemental oxygen at home.

## 2015-08-30 NOTE — Progress Notes (Signed)
Patient refused use of hospital CPAP. RT will continue to monitor as needed. 

## 2015-08-30 NOTE — Progress Notes (Signed)
Subjective: No events overnight. Reports breathing is slowly improving. Tolerating 2L O2 today.. Continues to have cough but improving. Denies any chest pain or palpations.    Objective: Vital signs in last 24 hours: Filed Vitals:   08/29/15 2056 08/29/15 2122 08/30/15 0125 08/30/15 0443  BP:    128/60  Pulse:    74  Temp:    98.7 F (37.1 C)  TempSrc:      Resp:    20  Height:      Weight:  171 lb 4.8 oz (77.701 kg) 171 lb 4.8 oz (77.701 kg)   SpO2: 93%   99%   Weight change: -1 lb 14.4 oz (-0.862 kg)  Intake/Output Summary (Last 24 hours) at 08/30/15 0801 Last data filed at 08/30/15 0541  Gross per 24 hour  Intake    890 ml  Output   1965 ml  Net  -1075 ml    General: alert, well-developed, and cooperative to examination.  Neck: supple, full ROM, no carotid bruits. No JVD appreciated.  Lungs: normal respiratory effort, no accessory muscle use, no crackles or wheezing Heart: regular rhythm with occasional irregular beat, no murmur, no gallop, and no rub.  Abdomen: soft, non-tender, normal bowel sounds, no distention Pulses: 2+ DP/PT pulses bilaterally Extremities: No cyanosis, worsened edema b/l LE, 1+ pitting edema, improving Psych: normal mood and affect  Medications: I have reviewed the patient's current medications. Scheduled Meds: . aspirin EC  81 mg Oral Daily  . carvedilol  18.75 mg Oral BID WC  . cefTRIAXone (ROCEPHIN)  IV  2 g Intravenous Q24H  . dextromethorphan-guaiFENesin  1 tablet Oral BID  . dorzolamide-timolol  1 drop Left Eye BID  . furosemide  40 mg Intravenous Q8H  . ipratropium-albuterol  3 mL Nebulization TID  . losartan  25 mg Oral Daily  . potassium chloride  40 mEq Oral BID  . pravastatin  40 mg Oral QHS  . zolpidem  5 mg Oral Once   Continuous Infusions:   PRN Meds:.acetaminophen **OR** acetaminophen, guaiFENesin-dextromethorphan, ipratropium-albuterol, traZODone Assessment/Plan:  CAP: Patient remains afebrile. Leukocytosis improving.  Coming down on O2 requirements, tolerating 3L [place] this morning. Sputum culture growing normal flora. This is likely strep pneumo infection improving with Abx. Currently on day 5 of Ceftriaxone and Azithromycin. Will stop Azithromycin after 5 day course. Continue Cephalosporin for 10 day course. Lungs clear no exam today.  -Oxygen therapy as needed goal SpO2 >90% -Ceftriaxone 2g IV 5/15>>> 5/21 7 days.  -Azithromycin  5/15>>>5/19 -Robitussin DM prn -Tylenol prn -PT eval done- no PT follow up recommended.  PAF likely causing some transient CHF CHAD-VASc 3. History of A. Fib s/p ablation. Currently with PAF. Started Pradaxa. Rate controlled with Coreg.  -Appreciate cardiology help -continue dabigatran (previously on, stopped 6 months ago) - had been on IV lasix q8hr for last few days. He is getting slightly azotemic on this and he appears to be less overloaded now. Will change to 40 mg BID PO lasix. Also put a limit of 1200 cc for his fluid intake to further help with volume maintenance.  - coreg 18.75 bid per cards.  Elevated Troponin: Peaked at 1.15 at admission. In setting of A.Fib and pneumonia. Possible occult MI 2 weeks ago with ST inversions on EKG. Plan for cath once PNA is improved. Will need to hold pradaxa tomorrow with likely cath on Monday.  -Appreciate cardiology help  Hypokalemia:Has been receiving lasix during hospitalization. Replacing with Oral K as needed.  HFpEF:  [facility] diastolic dysfunction and LVH without significantly reduced EF. Previous stress studies consistently false positives with negative subsequent caths so likely NICM. ECHO done yesterday with no LV dysfunction but unable to determine EF. Unable to determine if diastolic dysfunction present. PAP mildly elevated at 49. May need repeat ECHO prior to discharge when respiratory status improved and patient able to tolerate test better.  -ASA 81mg  -cardiology consulted,  appreciate the consult - coreg 18.75 bid per cards. - lasix as above.   HTN: On many agents PTA amlodipine 10mg , clonidine 0.1mg  TID, eplerenone 50mg , losartan 50mg . Will hold these. BP labile. -Consider restarting home meds if BP remains elevated -Continue Coreg  HLD: Stable chronic problem. - Continue home pravastatin 40mg , fenofibrate 160mg .  OSA:. qHS CPAP.  FULL CODE  Diet: Heart Healthy  VTE ppx: [place] enoxaparin  Dispo: Disposition is deferred at this time, awaiting improvement of current medical problems.    The patient does have a current PCP (No primary care provider on file.) and [facility].  The patient does not have transportation limitations that hinder transportation to clinic appointments.  .Services Needed at time of discharge: Y = Yes, Blank = No PT:   OT:   RN:   Equipment:   Other:     LOS: 6 days   Hyacinth Meekerasrif Camran Keady, MD IMT[facility] PGY-1 [phone] 08/30/2015, 8:01 AM

## 2015-08-30 NOTE — Progress Notes (Signed)
Subjective: [name] was seen and examined during morning rounds.  He continues to require 3L of O2 and develops SOB when weaned down from 3L.  He says cough has been improving.  Denies chest pain, palpitations, dizziness, abdominal pain, n/v, or abdominal pain.  He is eating well.  Pt worked with PT yesterday and says it went well.  He spoke with the cardiologist this morning who is planning for L heart cath tomorrow.  Pt is anxious about lying down for the procedure.    Objective: Vital signs in last 24 hours: Filed Vitals:   08/29/15 2056 08/29/15 2122 08/30/15 0125 08/30/15 0443  BP:    128/60  Pulse:    74  Temp:    98.7 F (37.1 C)  TempSrc:      Resp:    20  Height:      Weight:  77.701 kg (171 lb 4.8 oz) 77.701 kg (171 lb 4.8 oz)   SpO2: 93%   99%   Weight change: -0.862 kg (-1 lb 14.4 oz)  Intake/Output Summary (Last 24 hours) at 08/30/15 [id number] Last data filed at 08/30/15 0600  Gross per 24 hour  Intake    890 ml  Output   1965 ml  Net  -1075 ml   General: Well-appearing, conversant.  On nasal cannula at 3L.  Lungs: CTAB.  No wheezing or crackles. Heart: RRR. No murmurs. No JVD.  2+ radial pulses.  Abdomen: Normoactive bowel sounds.  Soft, nondistended, no TTP.  Extremities: Warm, dry.  No lower extremity edema.   Lab Results: CBC    Component Value Date/Time   WBC 11.7* 08/30/2015 0533   RBC 3.87* 08/30/2015 0533   HGB 11.9* 08/30/2015 0533   HCT 35.6* 08/30/2015 0533   PLT 498* 08/30/2015 0533   MCV 92.0 08/30/2015 0533   MCH 30.7 08/30/2015 0533   MCHC 33.4 08/30/2015 0533   RDW 12.7 08/30/2015 0533   LYMPHSABS 0.9 08/25/2015 0250   MONOABS 1.6* 08/25/2015 0250   EOSABS 0.0 08/25/2015 0250   BASOSABS 0.0 08/25/2015 0250    BMP Latest Ref Rng 08/30/2015 08/29/2015 08/28/2015  Glucose 65 - 99 mg/dL 960(A141(H) 540(J141(H) 811(B134(H)  BUN 6 - 20 mg/dL 14(N23(H) 82(N22(H) 56(O23(H)  Creatinine 0.61 - 1.24 mg/dL 1.301.13 8.650.93 7.840.96  Sodium 135 - 145 mmol/L 139 139 142  Potassium 3.5 -  5.1 mmol/L 3.8 3.3(L) 3.3(L)  Chloride 101 - 111 mmol/L 99(L) 100(L) 103  CO2 22 - 32 mmol/L 27 28 28   Calcium 8.9 - 10.3 mg/dL 9.8 9.4 9.3     Micro Results: Recent Results (from the past 240 hour(s))  Blood Culture (routine x 2)     Status: None   Collection Time: 08/24/15  7:22 PM  Result Value Ref Range Status   Specimen Description BLOOD LEFT HAND  Final   Special Requests IN PEDIATRIC BOTTLE 3CC  Final   Culture NO GROWTH 5 DAYS  Final   Report Status 08/29/2015 FINAL  Final  Blood Culture (routine x 2)     Status: None   Collection Time: 08/24/15  8:11 PM  Result Value Ref Range Status   Specimen Description BLOOD LEFT ANTECUBITAL  Final   Special Requests BOTTLES DRAWN AEROBIC AND ANAEROBIC 5CC  Final   Culture NO GROWTH 5 DAYS  Final   Report Status 08/29/2015 FINAL  Final  Urine culture     Status: None   Collection Time: 08/24/15 11:27 PM  Result Value Ref Range Status  Specimen Description URINE, RANDOM  Final   Special Requests NONE  Final   Culture NO GROWTH  Final   Report Status 08/26/2015 FINAL  Final  MRSA PCR Screening     Status: None   Collection Time: 08/24/15 11:28 PM  Result Value Ref Range Status   MRSA by PCR NEGATIVE NEGATIVE Final    Comment:        The GeneXpert MRSA Assay (FDA approved for NASAL specimens only), is one component of a comprehensive MRSA colonization surveillance program. It is not intended to diagnose MRSA infection nor to guide or monitor treatment for MRSA infections.   Culture, sputum-assessment     Status: None   Collection Time: 08/25/15  2:50 PM  Result Value Ref Range Status   Specimen Description SPUTUM  Final   Special Requests Normal  Final   Sputum evaluation   Final    THIS SPECIMEN IS ACCEPTABLE. RESPIRATORY CULTURE REPORT TO FOLLOW.   Report Status 08/25/2015 FINAL  Final  Culture, respiratory (NON-Expectorated)     Status: None   Collection Time: 08/25/15  3:09 PM  Result Value Ref Range Status    Specimen Description SPUTUM  Final   Special Requests NONE  Final   Gram Stain   Final    MODERATE WBC PRESENT,BOTH PMN AND MONONUCLEAR FEW SQUAMOUS EPITHELIAL CELLS PRESENT MODERATE GRAM POSITIVE COCCI IN PAIRS MODERATE GRAM NEGATIVE RODS THIS SPECIMEN IS ACCEPTABLE FOR SPUTUM CULTURE Performed at [facility]    Culture   Final    NORMAL OROPHARYNGEAL FLORA Performed at [facility]    Report Status 08/28/2015 FINAL  Final   Studies/Results: No results found. Medications: I have reviewed the patient's current medications.  Scheduled Meds: . aspirin EC  81 mg Oral Daily  . carvedilol  18.75 mg Oral BID WC  . cefTRIAXone (ROCEPHIN)  IV  2 g Intravenous Q24H  . dextromethorphan-guaiFENesin  1 tablet Oral BID  . dorzolamide-timolol  1 drop Left Eye BID  . furosemide  40 mg Intravenous Q8H  . ipratropium-albuterol  3 mL Nebulization TID  . losartan  25 mg Oral Daily  . potassium chloride  40 mEq Oral BID  . pravastatin  40 mg Oral QHS  . zolpidem  5 mg Oral Once   Continuous Infusions:  PRN Meds:.acetaminophen **OR** acetaminophen, guaiFENesin-dextromethorphan, ipratropium-albuterol, traZODone  Assessment/Plan: Shortness of breath Sputum culture grew G(+) cocci in pairs - being treated for strep pneumo PNA. WBC continue to trend downward, pt remains afebrile.  Finished 5 day course of azithromycin yesterday.  Will transition IV rocephin to oral Rocephin today (today is day 6 of 10-day course).  Pt tolerating nasal cannula at 3 L today.  Cough continues to improve.  -Oxygen therapy as needed goal SpO2 >92%. Currently on 3 L . Wean O2 as able.  -Transition to oral Rocephin (Day 6/10)  (Finished 5 day course of azithromycin yesterday)  -Robitussin PRN  -Tylenol PRN  -Lasix  tid   -Fluid restriction to 1200 cc's daily  -Continued scheduled nebs  HFpEF: Denies any chest pain at this time. Has been in and out of a-fib, a-flutter, PACs during  hospitalization, currently in NSR. Rate has been well-controlled in the 80s. Cardiology is planning for left heart cath tomorrow.  Holding Pradaxa prior to procedure.   -Cardiology following patient, appreciate help.  Planning for L heart cath tomorrow.  -Holding Pradaxa today and tomorrow before L heart cath  -ASA    Hypertension: BP well-controlled  in 130s-140s.  -Increase Coreg to 18.75 mg bid per cards rec -Start losartan 25mg  daily per cards rec (pt is normally on 50mg  bid at home, but has been held during hospitalization)    Hypokalemia: K currently 3.8.  Replete K PRN.  -Hold KDur in setting of normal potassium-  This is a Psychologist, occupational Note.  The care of the patient was discussed with Dr. Hyacinth Meeker and the assessment and plan formulated with their assistance.  Please see their attached note for official documentation of the daily encounter.   LOS: 6 days   Nathaneil Canary, Med Student 08/30/2015, 8:21 AM

## 2015-08-31 ENCOUNTER — Encounter (HOSPITAL_COMMUNITY)
Admission: EM | Disposition: A | Discharge status: Home / Self Care | Payer: Self-pay | Source: Home / Self Care | Attending: Internal Medicine

## 2015-08-31 DIAGNOSIS — R0902 Hypoxemia: Secondary | ICD-10-CM

## 2015-08-31 DIAGNOSIS — I214 Non-ST elevation (NSTEMI) myocardial infarction: Secondary | ICD-10-CM | POA: Insufficient documentation

## 2015-08-31 DIAGNOSIS — I2510 Atherosclerotic heart disease of native coronary artery without angina pectoris: Secondary | ICD-10-CM

## 2015-08-31 HISTORY — PX: CARDIAC CATHETERIZATION: SHX172

## 2015-08-31 LAB — CBC
HEMATOCRIT: 34.7 % — AB (ref 39.0–52.0)
Hemoglobin: 11.4 g/dL — ABNORMAL LOW (ref 13.0–17.0)
MCH: 30.9 pg (ref 26.0–34.0)
MCHC: 32.9 g/dL (ref 30.0–36.0)
MCV: 94 fL (ref 78.0–100.0)
PLATELETS: 439 10*3/uL — AB (ref 150–400)
RBC: 3.69 MIL/uL — ABNORMAL LOW (ref 4.22–5.81)
RDW: 12.8 % (ref 11.5–15.5)
WBC: 10.6 10*3/uL — ABNORMAL HIGH (ref 4.0–10.5)

## 2015-08-31 LAB — BASIC METABOLIC PANEL
Anion gap: 9 (ref 5–15)
BUN: 25 mg/dL — ABNORMAL HIGH (ref 6–20)
CALCIUM: 9.8 mg/dL (ref 8.9–10.3)
CO2: 26 mmol/L (ref 22–32)
CREATININE: 1 mg/dL (ref 0.61–1.24)
Chloride: 103 mmol/L (ref 101–111)
GLUCOSE: 119 mg/dL — AB (ref 65–99)
Potassium: 3.9 mmol/L (ref 3.5–5.1)
Sodium: 138 mmol/L (ref 135–145)

## 2015-08-31 LAB — PROTIME-INR
INR: 1.23 (ref 0.00–1.49)
Prothrombin Time: 15.7 seconds — ABNORMAL HIGH (ref 11.6–15.2)

## 2015-08-31 LAB — POCT ACTIVATED CLOTTING TIME: ACTIVATED CLOTTING TIME: 518 s

## 2015-08-31 SURGERY — LEFT HEART CATH AND CORONARY ANGIOGRAPHY
Anesthesia: LOCAL

## 2015-08-31 MED ORDER — CLOPIDOGREL BISULFATE 300 MG PO TABS
ORAL_TABLET | ORAL | Status: AC
  Filled 2015-08-31: qty 1

## 2015-08-31 MED ORDER — MIDAZOLAM HCL 2 MG/2ML IJ SOLN
INTRAMUSCULAR | Status: AC
  Filled 2015-08-31: qty 2

## 2015-08-31 MED ORDER — SODIUM CHLORIDE 0.9 % WEIGHT BASED INFUSION: 1.0000 mL/kg/h | INTRAVENOUS | Status: DC

## 2015-08-31 MED ORDER — BIVALIRUDIN 250 MG IV SOLR
250.0000 mg | INTRAVENOUS | Status: DC | PRN
  Administered 2015-08-31: 1.75 mg/kg/h via INTRAVENOUS

## 2015-08-31 MED ORDER — VERAPAMIL HCL 2.5 MG/ML IV SOLN
INTRAVENOUS | Status: AC
  Filled 2015-08-31: qty 2

## 2015-08-31 MED ORDER — HEPARIN (PORCINE) IN NACL 2-0.9 UNIT/ML-% IJ SOLN
INTRAMUSCULAR | Status: DC | PRN
  Administered 2015-08-31: 10 mL via INTRA_ARTERIAL

## 2015-08-31 MED ORDER — HEPARIN (PORCINE) IN NACL 2-0.9 UNIT/ML-% IJ SOLN
INTRAMUSCULAR | Status: AC
  Filled 2015-08-31: qty 1000

## 2015-08-31 MED ORDER — SODIUM CHLORIDE 0.9 % WEIGHT BASED INFUSION
3.0000 mL/kg/h | INTRAVENOUS | Status: DC
  Administered 2015-08-31: 3 mL/kg/h via INTRAVENOUS

## 2015-08-31 MED ORDER — IOPAMIDOL (ISOVUE-370) INJECTION 76%
INTRAVENOUS | Status: DC | PRN
Start: 2015-08-31 — End: 2015-08-31
  Administered 2015-08-31: 185 mL via INTRA_ARTERIAL

## 2015-08-31 MED ORDER — SODIUM CHLORIDE 0.9 % IV SOLN: 250.0000 mL | INTRAVENOUS | Status: DC | PRN

## 2015-08-31 MED ORDER — HEPARIN (PORCINE) IN NACL 2-0.9 UNIT/ML-% IJ SOLN
INTRAMUSCULAR | Status: DC | PRN
  Administered 2015-08-31: 1000 mL

## 2015-08-31 MED ORDER — FUROSEMIDE 10 MG/ML IJ SOLN
40.0000 mg | Freq: Two times a day (BID) | INTRAMUSCULAR | Status: DC
  Administered 2015-08-31 – 2015-09-02 (×4): 40 mg via INTRAVENOUS
  Filled 2015-08-31 (×4): qty 4

## 2015-08-31 MED ORDER — IOPAMIDOL (ISOVUE-370) INJECTION 76%
INTRAVENOUS | Status: AC
  Filled 2015-08-31: qty 100

## 2015-08-31 MED ORDER — SODIUM CHLORIDE 0.9% FLUSH
3.0000 mL | Freq: Two times a day (BID) | INTRAVENOUS | Status: DC
  Administered 2015-08-31: 3 mL via INTRAVENOUS

## 2015-08-31 MED ORDER — ISOSORBIDE MONONITRATE ER 30 MG PO TB24
30.0000 mg | ORAL_TABLET | Freq: Every day | ORAL | Status: DC
  Administered 2015-08-31 – 2015-09-03 (×4): 30 mg via ORAL
  Filled 2015-08-31 (×4): qty 1

## 2015-08-31 MED ORDER — SODIUM CHLORIDE 0.9% FLUSH
3.0000 mL | INTRAVENOUS | Status: DC | PRN
  Administered 2015-09-02: 3 mL via INTRAVENOUS
  Filled 2015-08-31: qty 3

## 2015-08-31 MED ORDER — FENTANYL CITRATE (PF) 100 MCG/2ML IJ SOLN
INTRAMUSCULAR | Status: DC | PRN
  Administered 2015-08-31: 25 ug via INTRAVENOUS

## 2015-08-31 MED ORDER — SODIUM CHLORIDE 0.9 % IV SOLN
INTRAVENOUS | Status: DC | PRN
  Administered 2015-08-31: 10 mL/h via INTRAVENOUS

## 2015-08-31 MED ORDER — SODIUM CHLORIDE 0.9% FLUSH: 3.0000 mL | INTRAVENOUS | Status: DC | PRN

## 2015-08-31 MED ORDER — FUROSEMIDE 10 MG/ML IJ SOLN
INTRAMUSCULAR | Status: AC
  Filled 2015-08-31: qty 4

## 2015-08-31 MED ORDER — CETYLPYRIDINIUM CHLORIDE 0.05 % MT LIQD
7.0000 mL | Freq: Two times a day (BID) | OROMUCOSAL | Status: DC
  Administered 2015-08-31 – 2015-09-02 (×2): 7 mL via OROMUCOSAL

## 2015-08-31 MED ORDER — ATORVASTATIN CALCIUM 80 MG PO TABS
80.0000 mg | ORAL_TABLET | Freq: Every day | ORAL | Status: DC
  Administered 2015-08-31 – 2015-09-02 (×3): 80 mg via ORAL
  Filled 2015-08-31 (×3): qty 1

## 2015-08-31 MED ORDER — NITROGLYCERIN 1 MG/10 ML FOR IR/CATH LAB
INTRA_ARTERIAL | Status: DC | PRN
  Administered 2015-08-31: 200 ug via INTRACORONARY

## 2015-08-31 MED ORDER — HEPARIN SODIUM (PORCINE) 1000 UNIT/ML IJ SOLN
INTRAMUSCULAR | Status: DC | PRN
  Administered 2015-08-31: 4000 [IU] via INTRAVENOUS

## 2015-08-31 MED ORDER — SODIUM CHLORIDE 0.9% FLUSH
3.0000 mL | Freq: Two times a day (BID) | INTRAVENOUS | Status: DC
  Administered 2015-08-31 – 2015-09-02 (×5): 3 mL via INTRAVENOUS

## 2015-08-31 MED ORDER — CLOPIDOGREL BISULFATE 75 MG PO TABS
75.0000 mg | ORAL_TABLET | Freq: Every day | ORAL | Status: DC
  Administered 2015-09-01 – 2015-09-03 (×3): 75 mg via ORAL
  Filled 2015-08-31 (×3): qty 1

## 2015-08-31 MED ORDER — FENTANYL CITRATE (PF) 100 MCG/2ML IJ SOLN
INTRAMUSCULAR | Status: AC
  Filled 2015-08-31: qty 2

## 2015-08-31 MED ORDER — FUROSEMIDE 10 MG/ML IJ SOLN
INTRAMUSCULAR | Status: DC | PRN
  Administered 2015-08-31: 40 mg via INTRAVENOUS

## 2015-08-31 MED ORDER — FUROSEMIDE 80 MG PO TABS: 80.0000 mg | ORAL_TABLET | Freq: Two times a day (BID) | ORAL | Status: DC

## 2015-08-31 MED ORDER — LIDOCAINE HCL (PF) 1 % IJ SOLN
INTRAMUSCULAR | Status: DC | PRN
  Administered 2015-08-31: 2 mL

## 2015-08-31 MED ORDER — BIVALIRUDIN BOLUS VIA INFUSION - CUPID
INTRAVENOUS | Status: DC | PRN
  Administered 2015-08-31: 57.975 mg via INTRAVENOUS

## 2015-08-31 MED ORDER — MIDAZOLAM HCL 2 MG/2ML IJ SOLN
INTRAMUSCULAR | Status: DC | PRN
  Administered 2015-08-31: 1 mg via INTRAVENOUS

## 2015-08-31 MED ORDER — HEPARIN SODIUM (PORCINE) 1000 UNIT/ML IJ SOLN
INTRAMUSCULAR | Status: AC
  Filled 2015-08-31: qty 1

## 2015-08-31 MED ORDER — CLOPIDOGREL BISULFATE 300 MG PO TABS
ORAL_TABLET | ORAL | Status: DC | PRN
Start: 2015-08-31 — End: 2015-08-31
  Administered 2015-08-31: 600 mg via ORAL

## 2015-08-31 MED ORDER — NITROGLYCERIN 1 MG/10 ML FOR IR/CATH LAB
INTRA_ARTERIAL | Status: AC
  Filled 2015-08-31: qty 10

## 2015-08-31 MED ORDER — BIVALIRUDIN 250 MG IV SOLR
INTRAVENOUS | Status: AC
  Filled 2015-08-31: qty 250

## 2015-08-31 MED ORDER — LIDOCAINE HCL (PF) 1 % IJ SOLN
INTRAMUSCULAR | Status: AC
  Filled 2015-08-31: qty 30

## 2015-08-31 SURGICAL SUPPLY — 21 items
BALLN EUPHORA RX 2.5X15 (BALLOONS) ×2
BALLN ~~LOC~~ EUPHORA RX 2.5X12 (BALLOONS) ×2
BALLOON EUPHORA RX 2.5X15 (BALLOONS) ×1 IMPLANT
BALLOON ~~LOC~~ EUPHORA RX 2.5X12 (BALLOONS) ×1 IMPLANT
CATH INFINITI 5 FR JL3.5 (CATHETERS) ×2 IMPLANT
CATH INFINITI 5FR ANG PIGTAIL (CATHETERS) ×2 IMPLANT
CATH INFINITI JR4 5F (CATHETERS) ×2 IMPLANT
DEVICE RAD COMP TR BAND LRG (VASCULAR PRODUCTS) ×2 IMPLANT
GLIDESHEATH SLEND SS 6F .021 (SHEATH) ×2 IMPLANT
GUIDE CATH RUNWAY 6FR CLS3.5 (CATHETERS) ×2 IMPLANT
KIT ENCORE 26 ADVANTAGE (KITS) ×2 IMPLANT
KIT HEART LEFT (KITS) ×2 IMPLANT
PACK CARDIAC CATHETERIZATION (CUSTOM PROCEDURE TRAY) ×2 IMPLANT
STENT SYNERGY DES 2.5X16 (Permanent Stent) ×2 IMPLANT
SYR MEDRAD MARK V 150ML (SYRINGE) ×2 IMPLANT
TRANSDUCER W/STOPCOCK (MISCELLANEOUS) ×2 IMPLANT
TUBING CIL FLEX 10 FLL-RA (TUBING) ×2 IMPLANT
WIRE ASAHI FIELDER XT 190CM (WIRE) ×2 IMPLANT
WIRE ASAHI PROWATER 180CM (WIRE) ×2 IMPLANT
WIRE HI TORQ VERSACORE-J 145CM (WIRE) ×2 IMPLANT
WIRE SAFE-T 1.5MM-J .035X260CM (WIRE) ×2 IMPLANT

## 2015-08-31 NOTE — Progress Notes (Signed)
Subjective: No events overnight. Reports breathing continues to slowly improve. Tolerating 1L O2 today. Continues to have cough but improving. Denies any chest pain or palpations. No nausea/vomiting.    Objective: Vital signs in last 24 hours: Filed Vitals:   08/31/15 0539 08/31/15 0618 08/31/15 0942 08/31/15 1040  BP: 111/56   101/54  Pulse: 77   71  Temp: 98.7 F (37.1 C)   98.9 F (37.2 C)  TempSrc:    Oral  Resp: 20   18  Height:      Weight:  170 lb 8 oz (77.338 kg)    SpO2:   98% 95%   Weight change: -12.8 oz (-0.363 kg)  Intake/Output Summary (Last 24 hours) at 08/31/15 1145 Last data filed at 08/31/15 0900  Gross per 24 hour  Intake    730 ml  Output   1545 ml  Net   -815 ml    General: alert, well-developed, and cooperative to examination.  Neck: supple, full ROM, no carotid bruits. No JVD appreciated.  Lungs: normal respiratory effort, no accessory muscle use, mild bibasilar crackles Heart: RRR, no murmur, no gallop, and no rub.  Abdomen: soft, non-tender, normal bowel sounds, no distention Pulses: 2+ DP/PT pulses bilaterally Extremities: No cyanosis, worsened edema b/l LE, 2+ pitting edema to knees  Psych: normal mood and affect  Medications: I have reviewed the patient's current medications. Scheduled Meds: . aspirin EC  81 mg Oral Daily  . carvedilol  18.75 mg Oral BID WC  . dextromethorphan-guaiFENesin  1 tablet Oral BID  . dorzolamide-timolol  1 drop Left Eye BID  . furosemide  40 mg Oral BID  . ipratropium-albuterol  3 mL Nebulization TID  . losartan  25 mg Oral Daily  . potassium chloride  40 mEq Oral Daily  . pravastatin  40 mg Oral QHS  . sodium chloride flush  3 mL Intravenous Q12H  . zolpidem  5 mg Oral Once   Continuous Infusions: . sodium chloride     PRN Meds:.sodium chloride, acetaminophen **OR** acetaminophen, guaiFENesin-dextromethorphan, ipratropium-albuterol, sodium chloride flush, traZODone Assessment/Plan:  CAP: Continues to  wean off O2, tolerating 1L [place] this morning. Sputum culture growing normal flora. This is likely strep pneumo infection improving with Abx. Finished 5 day course of Azithromycin and 7 day course of Ceftriaxone. Exam with mild bibasilar crackles and worsening LE edema.  -Oxygen therapy as needed goal SpO2 >90% -Ceftriaxone 2g IV 5/15>>> 5/21 -Azithromycin 500mg  5/15>>>5/19 -Robitussin DM prn -Tylenol prn -PT eval done- no PT follow up recommended. -Change Lasix to 80 mg PO BID -Fluid restrict to 1200 mL/day  PAF likely causing some transient CHF CHAD-VASc 3. History of A. Fib s/p ablation. Currently with PAF. Started Pradaxa. Rate controlled with Coreg. Holding Pradaxa for cath today.  -Appreciate cardiology help -hold dabigatran for cath -Will change to 80 mg BID PO lasix. Also put a limit of 1200 cc for his fluid intake to further help with volume maintenance.  - coreg 18.75 bid per cards.  Elevated Troponin: Peaked at 1.15 at admission. In setting of A.Fib and pneumonia. Possible occult MI 2 weeks ago with ST inversions on EKG. Plan for cath today. -Appreciate cardiology help -Hold pradaxa  Hypokalemia: K 3.9 today. Has been receiving lasix during hospitalization.  -KDur 40mEq daily  HFpEF: [facility] and LVH without significantly reduced EF. Previous stress studies consistently false positives with negative subsequent caths so likely NICM. ECHO done  yesterday with no LV dysfunction but unable to determine EF. Unable to determine if diastolic dysfunction present. PAP mildly elevated at 49. -ASA  -cardiology consulted, appreciate the consult - coreg 18.75 bid per cards. - lasix as above.   HTN: BP stable to soft. On many agents PTA amlodipine , clonidine 0.1mg  TID, eplerenone , losartan . Will hold these. BP labile. -Consider restarting home meds if BP becomes uncontrolled -Continue Coreg  HLD: Stable  chronic problem. -Continue home pravastatin , fenofibrate .  OSA:. qHS CPAP.  FULL CODE  Diet: Heart Healthy  VTE ppx: [place] enoxaparin  Dispo: Disposition is deferred at this time, awaiting improvement of current medical problems.    The patient does have a current PCP (No primary care provider on file.) and does need an [facility] [address] hospital follow-up appointment after discharge.  The patient does not have transportation limitations that hinder transportation to clinic appointments.  .Services Needed at time of discharge: Y = Yes, Blank = No PT:   OT:   RN:   Equipment:   Other:     LOS: 7 days   Valentino Nose, MD IMTS PGY-1 [phone] 08/31/2015, 11:45 AM

## 2015-08-31 NOTE — H&P (View-Only) (Signed)
Patient Name: Ivan Mccoy Date of Encounter: 08/31/2015  Active Problems:   CAP (community acquired pneumonia)   Community acquired pneumonia   Elevated troponin   Hypoxia   Paroxysmal atrial fibrillation (HCC)   Unstable angina pectoris (HCC)   Demand ischemia of myocardium [facility])   Patient Profile: 80 year old male with a past medical history of PAF. Presented to ED on 08/26/15 with SOB, cough and subjective fever and chills. Being treated for CAP, had elevated troponin, plan is for cath today 08/31/15.   SUBJECTIVE: Breathing better today, he denies chest pain, palpitations.    OBJECTIVE Filed Vitals:   08/31/15 0539 08/31/15 0618 08/31/15 0942 08/31/15 1040  BP: 111/56   101/54  Pulse: 77   71  Temp: 98.7 F (37.1 C)   98.9 F (37.2 C)  TempSrc:    Oral  Resp: 20   18  Height:      Weight:  170 lb 8 oz (77.338 kg)    SpO2:   98% 95%    Intake/Output Summary (Last 24 hours) at 08/31/15 1042 Last data filed at 08/31/15 0900  Gross per 24 hour  Intake    730 ml  Output   1545 ml  Net   -815 ml   Filed Weights   08/29/15 2122 08/30/15 0125 08/31/15 0618  Weight: 171 lb 4.8 oz (77.701 kg) 171 lb 4.8 oz (77.701 kg) 170 lb 8 oz (77.338 kg)    PHYSICAL EXAM General: Well developed, well nourished, male in no acute distress. Head: Normocephalic, atraumatic.  Neck: Supple without bruits, No JVD. Lungs:  Resp regular and unlabored, diminished in lower lobes.  Heart: RRR, S1, S2, no S3, S4, or murmur; no rub. Abdomen: Soft, non-tender, non-distended, BS + x 4.  Extremities: No clubbing, cyanosis,No edema.  Neuro: Alert and oriented X 3. Moves all extremities spontaneously. Psych: Normal affect.  LABS: CBC: Recent Labs  08/30/15 0533 08/31/15 0344  WBC 11.7* 10.6*  HGB 11.9* 11.4*  HCT 35.6* 34.7*  MCV 92.0 94.0  PLT 498* 439*   INR: Recent Labs  08/31/15 0344  INR 1.23   Basic Metabolic Panel: Recent Labs  08/30/15 0533 08/31/15 0344  NA 139  138  K 3.8 3.9  CL 99* 103  CO2 27 26  GLUCOSE 141* 119*  BUN 23* 25*  CREATININE 1.13 1.00  CALCIUM 9.8 9.8    Current facility-administered medications:  .  0.9 %  sodium chloride infusion, 250 mL, Intravenous, PRN, Antoine Poche, MD .  [EXPIRED] 0.9% sodium chloride infusion, 3 mL/kg/hr, Intravenous, Continuous, Last Rate: 233.1 mL/hr at 08/31/15 0850, 3 mL/kg/hr at 08/31/15 0850 **FOLLOWED BY** 0.9% sodium chloride infusion, 1 mL/kg/hr, Intravenous, Continuous, Antoine Poche, MD .  acetaminophen (TYLENOL) tablet 650 mg, 650 mg, Oral, Q6H PRN **OR** acetaminophen (TYLENOL) suppository 650 mg, 650 mg, Rectal, Q6H PRN, Fuller Plan, MD .  aspirin EC tablet 81 mg, 81 mg, Oral, Daily, Fuller Plan, MD, 81 mg at 08/30/15 1049 .  carvedilol (COREG) tablet 18.75 mg, 18.75 mg, Oral, BID WC, Antoine Poche, MD, 18.75 mg at 08/31/15 0608 .  dextromethorphan-guaiFENesin (MUCINEX DM) 30-600 MG per 12 hr tablet 1 tablet, 1 tablet, Oral, BID, Fuller Plan, MD, 1 tablet at 08/30/15 2305 .  dorzolamide-timolol (COSOPT) 22.3-6.8 MG/ML ophthalmic solution 1 drop, 1 drop, Left Eye, BID, Fuller Plan, MD, 1 drop at 08/30/15 2305 .  furosemide (LASIX) tablet 40 mg, 40 mg, Oral,  BID, Hyacinth Meekerasrif Ahmed, MD, 40 mg at 08/31/15 0846 .  guaiFENesin-dextromethorphan (ROBITUSSIN DM) 100-10 MG/5ML syrup 5 mL, 5 mL, Oral, Q4H PRN, Valentino NoseNathan Boswell, MD, 5 mL at 08/29/15 2025 .  ipratropium-albuterol (DUONEB) 0.5-2.5 (3) MG/3ML nebulizer solution 3 mL, 3 mL, Nebulization, Q4H PRN, Inez CatalinaEmily B Mullen, MD, 3 mL at 08/28/15 1719 .  ipratropium-albuterol (DUONEB) 0.5-2.5 (3) MG/3ML nebulizer solution 3 mL, 3 mL, Nebulization, TID, Inez CatalinaEmily B Mullen, MD, 3 mL at 08/31/15 0941 .  losartan (COZAAR) tablet 25 mg, 25 mg, Oral, Daily, Antoine PocheJonathan F Branch, MD, 25 mg at 08/30/15 1049 .  potassium chloride SA (K-DUR,KLOR-CON) CR tablet 40 mEq, 40 mEq, Oral, Daily, Tasrif Ahmed, MD .  pravastatin (PRAVACHOL) tablet  40 mg, 40 mg, Oral, QHS, Fuller Planhristopher W Rice, MD, 40 mg at 08/30/15 2305 .  sodium chloride flush (NS) 0.9 % injection 3 mL, 3 mL, Intravenous, Q12H, Antoine PocheJonathan F Branch, MD, 3 mL at 08/30/15 2306 .  sodium chloride flush (NS) 0.9 % injection 3 mL, 3 mL, Intravenous, PRN, Antoine PocheJonathan F Branch, MD .  traZODone (DESYREL) tablet 50 mg, 50 mg, Oral, QHS PRN, Valentino NoseNathan Boswell, MD, 50 mg at 08/28/15 2125 .  zolpidem (AMBIEN) tablet 5 mg, 5 mg, Oral, Once, Fuller Planhristopher W Rice, MD, 5 mg at 08/25/15 0138 . sodium chloride      TELE: NSR       ECG: NSR  Transthoracic Echocardiography 08/25/15 - Procedure narrative: Transthoracic echocardiography. Image  quality was adequate. The study was technically difficult. - Left ventricle: The cavity size was normal. There was mild focal  basal hypertrophy of the septum. Systolic function was normal.  Wall motion was normal; there were no regional wall motion  abnormalities. The study is not technically sufficient to allow  evaluation of LV diastolic function. - Aortic valve: Transvalvular velocity was within the normal range.  There was no stenosis. There was mild regurgitation. - Mitral valve: Calcified annulus. There was mild regurgitation. - Left atrium: The atrium was moderately dilated. - Right ventricle: The cavity size was normal. Wall thickness was  normal. Systolic function was normal. - Tricuspid valve: There was mild regurgitation. - Pulmonary arteries: Systolic pressure was moderately increased.  PA peak pressure: 49 mm Hg (S). - Inferior vena cava: The vessel was normal in size. The  respirophasic diameter changes were blunted (< 50%), consistent  with normal central venous pressure.  Current Medications:  . aspirin EC  81 mg Oral Daily  . carvedilol  18.75 mg Oral BID WC  . dextromethorphan-guaiFENesin  1 tablet Oral BID  . dorzolamide-timolol  1 drop Left Eye BID  . furosemide  40 mg Oral BID  . ipratropium-albuterol  3 mL  Nebulization TID  . losartan  25 mg Oral Daily  . potassium chloride  40 mEq Oral Daily  . pravastatin  40 mg Oral QHS  . sodium chloride flush  3 mL Intravenous Q12H  . zolpidem  5 mg Oral Once   . sodium chloride      ASSESSMENT AND PLAN: Active Problems:   CAP (community acquired pneumonia)   Community acquired pneumonia   Elevated troponin   Hypoxia   Paroxysmal atrial fibrillation (HCC)   Unstable angina pectoris (HCC)   Demand ischemia of myocardium (HCC)  1. Paroxysmal atrial fibrillation: He is rate controlled, HR is in 70-80's. On Coreg, is on Pradaxa for anticoagulation. This is on hold for cath today.   2. Elevated troponin: In setting of Afib RVR and CAP. Plan  is for cath as patient had episode of nausea, fatigue and malaise around May 2nd (pre admission) that led him to call 911, there was TWI in leads V5, V6 according to consult note. Patient is visiting family and lives on [facility]. Plan was for cath when he returned home, however given symptoms and elevated troponin, will do cath here before he returns home. Denies chest pain.   3. Hypoxia: Patient requiring supplemental O2 due to PNA.  Down to 2 L this am. He should be able to tolerate lying flat for cath today.   4. Community acquired pneumonia: Management per primary team.   5. PSVT: Patient had short run of SVT yesterday, his Coreg was increased to 18.75mg  BID, continues to have 8-10 beats of SVT. He is asymptomatic.     Signed, Little Ishikawa , NP 10:42 AM 08/31/2015 Pager [phone] Patient seen and examined and history reviewed. Agree with above findings and plan. No complaints today. In NSR with one short burst of SVT. No chest pain. Plan to proceed with cardiac cath today. The procedure and risks were reviewed including but not limited to death, myocardial infarction, stroke, arrythmias, bleeding, transfusion, emergency surgery, dye allergy, or renal dysfunction. The patient voices understanding  and is agreeable to proceed.   Peter [place], MDFACC 08/31/2015 3:09 PM

## 2015-08-31 NOTE — Interval H&P Note (Signed)
History and Physical Interval Note:  08/31/2015 3:11 PM  Ivan Mccoy  has presented today for surgery, with the diagnosis of unstable angina  The various methods of treatment have been discussed with the patient and family. After consideration of risks, benefits and other options for treatment, the patient has consented to  Procedure(s): Left Heart Cath and Coronary Angiography (N/A) as a surgical intervention .  The patient's history has been reviewed, patient examined, no change in status, stable for surgery.  I have reviewed the patient's chart and labs.  Questions were answered to the patient's satisfaction.   Cath Lab Visit (complete for each Cath Lab visit)  Clinical Evaluation Leading to the Procedure:   ACS: Yes.    Non-ACS:    Anginal Classification: CCS III  Anti-ischemic medical therapy: Minimal Therapy (1 class of medications)  Non-Invasive Test Results: No non-invasive testing performed  Prior CABG: No previous CABG        Theron Aristaeter [facility] Cane CreekJordanMD,FACC 08/31/2015 3:11 PM

## 2015-08-31 NOTE — Progress Notes (Signed)
  Date: 08/31/2015  Patient name: Ivan Mccoy  Medical record number: [id number]  Date of birth: 09/12/1935   This patient has been seen and the plan of care was discussed with the house staff. Please see Dr. Bonney RousselBoswell's note for complete details. I concur with his findings.   [facility]  Inez CatalinaEmily B Mullen, MD 08/31/2015, 3:00 PM

## 2015-08-31 NOTE — Progress Notes (Signed)
Subjective: [name] was seen and examined on morning rounds.  His cough continues to improve.  He says SOB is controlled on 1L of O2.  He is satting at 96% on 1L of oxygen.  Denies CP, palpitations, light-headedness, nausea, vomiting.  Has noticed worsening lower extremity edema since yesterday.        Objective: Vital signs in last 24 hours: Filed Vitals:   08/30/15 2039 08/31/15 0539 08/31/15 0618 08/31/15 0942  BP:  111/56    Pulse:  77    Temp:  98.7 F (37.1 C)    TempSrc:      Resp:  20    Height:      Weight:   77.338 kg (170 lb 8 oz)   SpO2: 95%   98%   Weight change: -0.363 kg (-12.8 oz)  Intake/Output Summary (Last 24 hours) at 08/31/15 1040 Last data filed at 08/31/15 0900  Gross per 24 hour  Intake    730 ml  Output   1545 ml  Net   -815 ml      General: Well-appearing. Nasal cannula at 1L Lungs: Slight expiratory wheezing in L upper lobe. Otherwise clear to auscultation.  Heart: RRR. No murmurs. No JVD. 2+ radial pulses.  Abdomen: Normoactive bowel sounds. Soft, nondistended, no TTP.  Extremities: 2+ bilateral lower extremity edema to knees.  Warm, dry.       Lab Results: CBC    Component Value Date/Time   WBC 10.6* 08/31/2015 0344   RBC 3.69* 08/31/2015 0344   HGB 11.4* 08/31/2015 0344   HCT 34.7* 08/31/2015 0344   PLT 439* 08/31/2015 0344   MCV 94.0 08/31/2015 0344   MCH 30.9 08/31/2015 0344   MCHC 32.9 08/31/2015 0344   RDW 12.8 08/31/2015 0344   LYMPHSABS 0.9 08/25/2015 0250   MONOABS 1.6* 08/25/2015 0250   EOSABS 0.0 08/25/2015 0250   BASOSABS 0.0 08/25/2015 0250    BMP Latest Ref Rng 08/31/2015 08/30/2015 08/29/2015  Glucose 65 - 99 mg/dL 960(A) 540(J) 811(B)  BUN 6 - 20 mg/dL 14(N) 82(N) 56(O)  Creatinine 0.61 - 1.24 mg/dL 1.30 8.65 7.84  Sodium 135 - 145 mmol/L 138 139 139  Potassium 3.5 - 5.1 mmol/L 3.9 3.8 3.3(L)  Chloride 101 - 111 mmol/L 103 99(L) 100(L)  CO2 22 - 32 mmol/L Calcium 8.9 - 10.3 mg/dL 9.8 9.8 9.4       Micro Results: Recent Results (from the past 240 hour(s))  Blood Culture (routine x 2)     Status: None   Collection Time: 08/24/15  7:22 PM  Result Value Ref Range Status   Specimen Description BLOOD LEFT HAND  Final   Special Requests IN PEDIATRIC BOTTLE 3CC  Final   Culture NO GROWTH 5 DAYS  Final   Report Status 08/29/2015 FINAL  Final  Blood Culture (routine x 2)     Status: None   Collection Time: 08/24/15  8:11 PM  Result Value Ref Range Status   Specimen Description BLOOD LEFT ANTECUBITAL  Final   Special Requests BOTTLES DRAWN AEROBIC AND ANAEROBIC 5CC  Final   Culture NO GROWTH 5 DAYS  Final   Report Status 08/29/2015 FINAL  Final  Urine culture     Status: None   Collection Time: 08/24/15 11:27 PM  Result Value Ref Range Status   Specimen Description URINE, RANDOM  Final   Special Requests NONE  Final   Culture NO GROWTH  Final   Report Status 08/26/2015  FINAL  Final  MRSA PCR Screening     Status: None   Collection Time: 08/24/15 11:28 PM  Result Value Ref Range Status   MRSA by PCR NEGATIVE NEGATIVE Final    Comment:        The GeneXpert MRSA Assay (FDA approved for NASAL specimens only), is one component of a comprehensive MRSA colonization surveillance program. It is not intended to diagnose MRSA infection nor to guide or monitor treatment for MRSA infections.   Culture, sputum-assessment     Status: None   Collection Time: 08/25/15  2:50 PM  Result Value Ref Range Status   Specimen Description SPUTUM  Final   Special Requests Normal  Final   Sputum evaluation   Final    THIS SPECIMEN IS ACCEPTABLE. RESPIRATORY CULTURE REPORT TO FOLLOW.   Report Status 08/25/2015 FINAL  Final  Culture, respiratory (NON-Expectorated)     Status: None   Collection Time: 08/25/15  3:09 PM  Result Value Ref Range Status   Specimen Description SPUTUM  Final   Special Requests NONE  Final   Gram Stain   Final    MODERATE WBC PRESENT,BOTH PMN AND MONONUCLEAR FEW  SQUAMOUS EPITHELIAL CELLS PRESENT MODERATE GRAM POSITIVE COCCI IN PAIRS MODERATE GRAM NEGATIVE RODS THIS SPECIMEN IS ACCEPTABLE FOR SPUTUM CULTURE Performed at [facility]    Culture   Final    NORMAL OROPHARYNGEAL FLORA Performed at [facility]    Report Status 08/28/2015 FINAL  Final   Studies/Results: No results found. Medications: I have reviewed the patient's current medications. Scheduled Meds: . aspirin EC  81 mg Oral Daily  . carvedilol  18.75 mg Oral BID WC  . dextromethorphan-guaiFENesin  1 tablet Oral BID  . dorzolamide-timolol  1 drop Left Eye BID  . furosemide  40 mg Oral BID  . ipratropium-albuterol  3 mL Nebulization TID  . losartan  25 mg Oral Daily  . potassium chloride  40 mEq Oral Daily  . pravastatin  40 mg Oral QHS  . sodium chloride flush  3 mL Intravenous Q12H  . zolpidem  5 mg Oral Once   Continuous Infusions: . sodium chloride     PRN Meds:.sodium chloride, acetaminophen **OR** acetaminophen, guaiFENesin-dextromethorphan, ipratropium-albuterol, sodium chloride flush, traZODone Assessment/Plan: Strep pneumo PNA Finished 5 day course of azithromycin and 7 day course of ceftriaxone.  Cough is improving.  SOB improves with neb treatments.  Is on nasal cannula at 1L, satting at 96%.  Increased lower extremity edema from yesterday - will titrate up Lasix.  -Oxygen therapy as needed goal SpO2 >92%. Currently on 1 L . Wean O2 as able.  -Robitussin PRN  -Tylenol PRN  -Change lasix to 80mg  bid oral  -Fluid restriction to 1200 cc's daily  -Continue scheduled nebs  HFpEF: Denies any chest pain at this time. Has been in and out of a-fib, a-flutter, PACs during hospitalization, currently in NSR. Rate has been well-controlled in the 80s. Cardiology is planning for left heart cath this afternoon. Holding Pradaxa prior to procedure. Increased lower extremity edema today.  -Cardiology following patient, appreciate help. Planning for L  heart cath today (4PM)  -Holding Pradaxa before L heart cath  -ASA 81mg   -Lasix 80mg  bid oral   Hypertension: BP well-controlled in 110s.  Was re-started on Losartan 25mg  and upped Coreg to 18.75 yesterday.   -Continue Coreg 18.75 mg bid  -Continue losartan 25mg  daily  Hypokalemia: K currently 3.9. Replete K PRN.  -Hold KDur 40mEq in setting  of normal potassium  This is a Psychologist, occupational Note.  The care of the patient was discussed with Dr. Valentino Nose and the assessment and plan formulated with their assistance.  Please see their attached note for official documentation of the daily encounter.   LOS: 7 days   Nathaneil Canary, Med Student 08/31/2015, 10:40 AM

## 2015-08-31 NOTE — Progress Notes (Signed)
Physical Therapy Treatment Patient Details Name: Micael Swisher MRN: [id number] Marsa ArisDOB: 03/24/1936 Today's Date: 08/31/2015    History of Present Illness Patient is a 80 yo male admitted 08/24/15 with SOB, fever, hypoxia, elevated troponin.  Patient with CAP, PAF, and cardiac cath 5/22 in the afternoon    PMH:  HTN, OSA, CHF, PAF    PT Comments    Moving quite well; I anticipate we will be able to wean O2 to room air pretty quickly; Went to Cardiac Cath pst PT session;   Will likely meet PT goals next session   Follow Up Recommendations  No PT follow up;Supervision for mobility/OOB     Equipment Recommendations  None recommended by PT    Recommendations for Other Services       Precautions / Restrictions Precautions Precautions: None Precaution Comments: On O2; weaning -- at 1 L O2    Mobility  Bed Mobility               General bed mobility comments: Patient in chair as PT entered room  Transfers Overall transfer level: Modified independent               General transfer comment: Increased time  Ambulation/Gait Ambulation/Gait assistance: Supervision Ambulation Distance (Feet): 70 Feet Assistive device: None (and pushing IV) Gait Pattern/deviations: Step-through pattern;Decreased stride length Gait velocity: decreased   General Gait Details: Slow, fairly steady gait.  Ambulating on O2.  Cues to self-monitor for activity tolerance   Stairs            Wheelchair Mobility    Modified Rankin (Stroke Patients Only)       Balance                                    Cognition Arousal/Alertness: Awake/alert Behavior During Therapy: WFL for tasks assessed/performed Overall Cognitive Status: Within Functional Limits for tasks assessed                      Exercises      General Comments General comments (skin integrity, edema, etc.): went to cardiac cath almost immediately after returning to the room      Pertinent  Vitals/Pain Pain Assessment: No/denies pain    Home Living                      Prior Function            PT Goals (current goals can now be found in the care plan section) Acute Rehab PT Goals Patient Stated Goal: To get cardiac cath done PT Goal Formulation: With patient/family Time For Goal Achievement: 09/05/15 Potential to Achieve Goals: Good Progress towards PT goals: Progressing toward goals    Frequency  Min 3X/week    PT Plan Current plan remains appropriate    Co-evaluation             End of Session Equipment Utilized During Treatment: Oxygen Activity Tolerance: Patient tolerated treatment well Patient left: in bed;with call bell/phone within reach (sitting EOBq)     Time: [id number] PT Time Calculation (min) (ACUTE ONLY): 18 min  Charges:  $Gait Training: 8-22 mins                    G Codes:      Olen PelGarrigan, Tvisha Schwoerer Hamff 08/31/2015, 4:06 PM  Van ClinesHolly Maggie Senseney, PT  Acute Rehabilitation Services Pager  [phone] Office [phone]

## 2015-08-31 NOTE — Care Management Important Message (Signed)
Important Message  Patient Details  Name: Ivan Mccoy MRN: [id number] Date of Birth: 05/30/1935   Medicare Important Message Given:  Yes    Tanetta Fuhriman, Annamarie Majorheryl U, RN 08/31/2015, 2:59 PM

## 2015-08-31 NOTE — Progress Notes (Signed)
Pt. Refused cpap for tonight. RT informed pt. To notify if he changes his mind.  

## 2015-08-31 NOTE — Progress Notes (Signed)
Patient Name: Ivan Mccoy Date of Encounter: 08/31/2015  Active Problems:   CAP (community acquired pneumonia)   Community acquired pneumonia   Elevated troponin   Hypoxia   Paroxysmal atrial fibrillation (HCC)   Unstable angina pectoris (HCC)   Demand ischemia of myocardium [facility])   Patient Profile: 80 year old male with a past medical history of PAF. Presented to ED on 08/26/15 with SOB, cough and subjective fever and chills. Being treated for CAP, had elevated troponin, plan is for cath today 08/31/15.   SUBJECTIVE: Breathing better today, he denies chest pain, palpitations.    OBJECTIVE Filed Vitals:   08/31/15 0539 08/31/15 0618 08/31/15 0942 08/31/15 1040  BP: 111/56   101/54  Pulse: 77   71  Temp: 98.7 F (37.1 C)   98.9 F (37.2 C)  TempSrc:    Oral  Resp: 20   18  Height:      Weight:  170 lb 8 oz (77.338 kg)    SpO2:   98% 95%    Intake/Output Summary (Last 24 hours) at 08/31/15 1042 Last data filed at 08/31/15 0900  Gross per 24 hour  Intake    730 ml  Output   1545 ml  Net   -815 ml   Filed Weights   08/29/15 2122 08/30/15 0125 08/31/15 0618  Weight: 171 lb 4.8 oz (77.701 kg) 171 lb 4.8 oz (77.701 kg) 170 lb 8 oz (77.338 kg)    PHYSICAL EXAM General: Well developed, well nourished, male in no acute distress. Head: Normocephalic, atraumatic.  Neck: Supple without bruits, No JVD. Lungs:  Resp regular and unlabored, diminished in lower lobes.  Heart: RRR, S1, S2, no S3, S4, or murmur; no rub. Abdomen: Soft, non-tender, non-distended, BS + x 4.  Extremities: No clubbing, cyanosis,No edema.  Neuro: Alert and oriented X 3. Moves all extremities spontaneously. Psych: Normal affect.  LABS: CBC: Recent Labs  08/30/15 0533 08/31/15 0344  WBC 11.7* 10.6*  HGB 11.9* 11.4*  HCT 35.6* 34.7*  MCV 92.0 94.0  PLT 498* 439*   INR: Recent Labs  08/31/15 0344  INR 1.23   Basic Metabolic Panel: Recent Labs  08/30/15 0533 08/31/15 0344  NA 139  138  K 3.8 3.9  CL 99* 103  CO2 27 26  GLUCOSE 141* 119*  BUN 23* 25*  CREATININE 1.13 1.00  CALCIUM 9.8 9.8    Current facility-administered medications:  .  0.9 %  sodium chloride infusion, 250 mL, Intravenous, PRN, Antoine Poche, MD .  [EXPIRED] 0.9% sodium chloride infusion, 3 mL/kg/hr, Intravenous, Continuous, Last Rate: 233.1 mL/hr at 08/31/15 0850, 3 mL/kg/hr at 08/31/15 0850 **FOLLOWED BY** 0.9% sodium chloride infusion, 1 mL/kg/hr, Intravenous, Continuous, Antoine Poche, MD .  acetaminophen (TYLENOL) tablet 650 mg, 650 mg, Oral, Q6H PRN **OR** acetaminophen (TYLENOL) suppository 650 mg, 650 mg, Rectal, Q6H PRN, Fuller Plan, MD .  aspirin EC tablet 81 mg, 81 mg, Oral, Daily, Fuller Plan, MD, 81 mg at 08/30/15 1049 .  carvedilol (COREG) tablet 18.75 mg, 18.75 mg, Oral, BID WC, Antoine Poche, MD, 18.75 mg at 08/31/15 0608 .  dextromethorphan-guaiFENesin (MUCINEX DM) 30-600 MG per 12 hr tablet 1 tablet, 1 tablet, Oral, BID, Fuller Plan, MD, 1 tablet at 08/30/15 2305 .  dorzolamide-timolol (COSOPT) 22.3-6.8 MG/ML ophthalmic solution 1 drop, 1 drop, Left Eye, BID, Fuller Plan, MD, 1 drop at 08/30/15 2305 .  furosemide (LASIX) tablet 40 mg, 40 mg, Oral,  BID, Hyacinth Meekerasrif Ahmed, MD, 40 mg at 08/31/15 0846 .  guaiFENesin-dextromethorphan (ROBITUSSIN DM) 100-10 MG/5ML syrup 5 mL, 5 mL, Oral, Q4H PRN, Valentino NoseNathan Boswell, MD, 5 mL at 08/29/15 2025 .  ipratropium-albuterol (DUONEB) 0.5-2.5 (3) MG/3ML nebulizer solution 3 mL, 3 mL, Nebulization, Q4H PRN, Inez CatalinaEmily B Mullen, MD, 3 mL at 08/28/15 1719 .  ipratropium-albuterol (DUONEB) 0.5-2.5 (3) MG/3ML nebulizer solution 3 mL, 3 mL, Nebulization, TID, Inez CatalinaEmily B Mullen, MD, 3 mL at 08/31/15 0941 .  losartan (COZAAR) tablet 25 mg, 25 mg, Oral, Daily, Antoine PocheJonathan F Branch, MD, 25 mg at 08/30/15 1049 .  potassium chloride SA (K-DUR,KLOR-CON) CR tablet 40 mEq, 40 mEq, Oral, Daily, Tasrif Ahmed, MD .  pravastatin (PRAVACHOL) tablet  40 mg, 40 mg, Oral, QHS, Fuller Planhristopher W Rice, MD, 40 mg at 08/30/15 2305 .  sodium chloride flush (NS) 0.9 % injection 3 mL, 3 mL, Intravenous, Q12H, Antoine PocheJonathan F Branch, MD, 3 mL at 08/30/15 2306 .  sodium chloride flush (NS) 0.9 % injection 3 mL, 3 mL, Intravenous, PRN, Antoine PocheJonathan F Branch, MD .  traZODone (DESYREL) tablet 50 mg, 50 mg, Oral, QHS PRN, Valentino NoseNathan Boswell, MD, 50 mg at 08/28/15 2125 .  zolpidem (AMBIEN) tablet 5 mg, 5 mg, Oral, Once, Fuller Planhristopher W Rice, MD, 5 mg at 08/25/15 0138 . sodium chloride      TELE: NSR       ECG: NSR  Transthoracic Echocardiography 08/25/15 - Procedure narrative: Transthoracic echocardiography. Image  quality was adequate. The study was technically difficult. - Left ventricle: The cavity size was normal. There was mild focal  basal hypertrophy of the septum. Systolic function was normal.  Wall motion was normal; there were no regional wall motion  abnormalities. The study is not technically sufficient to allow  evaluation of LV diastolic function. - Aortic valve: Transvalvular velocity was within the normal range.  There was no stenosis. There was mild regurgitation. - Mitral valve: Calcified annulus. There was mild regurgitation. - Left atrium: The atrium was moderately dilated. - Right ventricle: The cavity size was normal. Wall thickness was  normal. Systolic function was normal. - Tricuspid valve: There was mild regurgitation. - Pulmonary arteries: Systolic pressure was moderately increased.  PA peak pressure: 49 mm Hg (S). - Inferior vena cava: The vessel was normal in size. The  respirophasic diameter changes were blunted (< 50%), consistent  with normal central venous pressure.  Current Medications:  . aspirin EC  81 mg Oral Daily  . carvedilol  18.75 mg Oral BID WC  . dextromethorphan-guaiFENesin  1 tablet Oral BID  . dorzolamide-timolol  1 drop Left Eye BID  . furosemide  40 mg Oral BID  . ipratropium-albuterol  3 mL  Nebulization TID  . losartan  25 mg Oral Daily  . potassium chloride  40 mEq Oral Daily  . pravastatin  40 mg Oral QHS  . sodium chloride flush  3 mL Intravenous Q12H  . zolpidem  5 mg Oral Once   . sodium chloride      ASSESSMENT AND PLAN: Active Problems:   CAP (community acquired pneumonia)   Community acquired pneumonia   Elevated troponin   Hypoxia   Paroxysmal atrial fibrillation (HCC)   Unstable angina pectoris (HCC)   Demand ischemia of myocardium (HCC)  1. Paroxysmal atrial fibrillation: He is rate controlled, HR is in 70-80's. On Coreg, is on Pradaxa for anticoagulation. This is on hold for cath today.   2. Elevated troponin: In setting of Afib RVR and CAP. Plan  is for cath as patient had episode of nausea, fatigue and malaise around May 2nd (pre admission) that led him to call 911, there was TWI in leads V5, V6 according to consult note. Patient is visiting family and lives on [facility]. Plan was for cath when he returned home, however given symptoms and elevated troponin, will do cath here before he returns home. Denies chest pain.   3. Hypoxia: Patient requiring supplemental O2 due to PNA.  Down to 2 L this am. He should be able to tolerate lying flat for cath today.   4. Community acquired pneumonia: Management per primary team.   5. PSVT: Patient had short run of SVT yesterday, his Coreg was increased to 18.75mg  BID, continues to have 8-10 beats of SVT. He is asymptomatic.     Signed, Little Ishikawa , NP 10:42 AM 08/31/2015 Pager [phone] Patient seen and examined and history reviewed. Agree with above findings and plan. No complaints today. In NSR with one short burst of SVT. No chest pain. Plan to proceed with cardiac cath today. The procedure and risks were reviewed including but not limited to death, myocardial infarction, stroke, arrythmias, bleeding, transfusion, emergency surgery, dye allergy, or renal dysfunction. The patient voices understanding  and is agreeable to proceed.   Izzie Geers [place], MDFACC 08/31/2015 3:09 PM

## 2015-09-01 ENCOUNTER — Encounter (HOSPITAL_COMMUNITY): Payer: Self-pay | Admitting: Cardiology

## 2015-09-01 DIAGNOSIS — I214 Non-ST elevation (NSTEMI) myocardial infarction: Secondary | ICD-10-CM

## 2015-09-01 DIAGNOSIS — Z955 Presence of coronary angioplasty implant and graft: Secondary | ICD-10-CM

## 2015-09-01 DIAGNOSIS — I5041 Acute combined systolic (congestive) and diastolic (congestive) heart failure: Secondary | ICD-10-CM

## 2015-09-01 LAB — CBC
HEMATOCRIT: 32.9 % — AB (ref 39.0–52.0)
HEMOGLOBIN: 10.9 g/dL — AB (ref 13.0–17.0)
MCH: 30.5 pg (ref 26.0–34.0)
MCHC: 33.1 g/dL (ref 30.0–36.0)
MCV: 92.2 fL (ref 78.0–100.0)
Platelets: 468 10*3/uL — ABNORMAL HIGH (ref 150–400)
RBC: 3.57 MIL/uL — ABNORMAL LOW (ref 4.22–5.81)
RDW: 12.6 % (ref 11.5–15.5)
WBC: 8.9 10*3/uL (ref 4.0–10.5)

## 2015-09-01 LAB — BASIC METABOLIC PANEL
Anion gap: 9 (ref 5–15)
BUN: 24 mg/dL — AB (ref 6–20)
CALCIUM: 9.1 mg/dL (ref 8.9–10.3)
CHLORIDE: 101 mmol/L (ref 101–111)
CO2: 26 mmol/L (ref 22–32)
CREATININE: 0.99 mg/dL (ref 0.61–1.24)
GFR calc Af Amer: >60 mL/min (ref >60)
GFR calc non Af Amer: >60 mL/min (ref >60)
Glucose, Bld: 104 mg/dL — ABNORMAL HIGH (ref 65–99)
Potassium: 3.7 mmol/L (ref 3.5–5.1)
SODIUM: 136 mmol/L (ref 135–145)

## 2015-09-01 LAB — GLUCOSE, CAPILLARY: Glucose-Capillary: 163 mg/dL — ABNORMAL HIGH (ref 65–99)

## 2015-09-01 MED ORDER — EPLERENONE 25 MG PO TABS
25.0000 mg | ORAL_TABLET | Freq: Every day | ORAL | Status: DC
  Administered 2015-09-01: 25 mg via ORAL
  Filled 2015-09-01 (×2): qty 1

## 2015-09-01 MED ORDER — SPIRONOLACTONE 25 MG PO TABS
25.0000 mg | ORAL_TABLET | Freq: Every day | ORAL | Status: DC
  Filled 2015-09-01: qty 1

## 2015-09-01 MED ORDER — RIVAROXABAN 20 MG PO TABS
20.0000 mg | ORAL_TABLET | Freq: Every day | ORAL | Status: DC
  Administered 2015-09-01 – 2015-09-02 (×2): 20 mg via ORAL
  Filled 2015-09-01 (×2): qty 1

## 2015-09-01 MED ORDER — NON FORMULARY: 25.0000 mg | Freq: Every day | Status: DC

## 2015-09-01 NOTE — Progress Notes (Signed)
CARDIAC REHAB PHASE I   PRE:  Rate/Rhythm: 78 SR    BP: sitting 121/56    SaO2: 97 RA  MODE:  Ambulation: 700 ft   POST:  Rate/Rhythm: 93 SR    BP: sitting 128/67     SaO2: 94 RA  Pt able to walk long distance with min assist, no O2. No major c/o however pt tired after walking. To recliner, SaO2 stable. Ed completed with pt and wife with good reception. Understands importance of Plavix and other meds. Encouraged watching sodium and increasing ex. Gave HF booklet and encouraged daily wts. [facility] ([place] Pt seemed very tired by end of education and I encouraged him to rest. Wife very receptive. She sts he has had LEE for 1 month. Will give HF video to watch. [phone]   Harriet MassonRandi Kristan Kevyn Wengert CES, ACSM 09/01/2015 10:52 AM

## 2015-09-01 NOTE — Progress Notes (Signed)
PT Cancellation Note  Patient Details Name: Xavyer Augello MRN: [id number] DOB: 03/09/1936   Cancelled Treatment:    Reason Eval/Treat Not Completed: Fatigue/lethargy limiting ability to participate.   Patient up in chair sleeping and easily aroused, however having difficulty maintaining alertness. Discussed remaining goals for PT (determine need for DME and stair training). Pt reported he walked without holding onto anything when walking with Cardiac Rehab earlier today. States he is too tired to attempt stairs at this time and requested PT return tomorrow.   Whittley Carandang 09/01/2015, 2:30 PM  Pager [phone]

## 2015-09-01 NOTE — Progress Notes (Signed)
Patient Name: Ivan Mccoy Date of Encounter: 09/01/2015  Active Problems:   CAP (community acquired pneumonia)   Community acquired pneumonia   Elevated troponin   Hypoxia   Paroxysmal atrial fibrillation (HCC)   Unstable angina pectoris (HCC)   Demand ischemia of myocardium (HCC)   NSTEMI (non-ST elevated myocardial infarction) [facility])   Patient Profile: 80 year old male with a past medical history of PAF. Presented to ED on 08/26/15 with SOB, cough and subjective fever and chills. Being treated for CAP, had elevated troponin s/p cath 5/22  SUBJECTIVE: Denies chest pain and SOB. Coughing has improved.     OBJECTIVE Filed Vitals:   09/01/15 0400 09/01/15 0600 09/01/15 0753 09/01/15 0830  BP: 127/60 130/70  132/57  Pulse: 71 74  82  Temp:    97.3 F (36.3 C)  TempSrc:    Axillary  Resp: Height:      Weight:  167 lb 12.3 oz (76.1 kg)    SpO2: 95% 96% 98% 96%    Intake/Output Summary (Last 24 hours) at 09/01/15 0914 Last data filed at 09/01/15 0912  Gross per 24 hour  Intake    840 ml  Output   2900 ml  Net  -2060 ml   Filed Weights   08/30/15 0125 08/31/15 0618 09/01/15 0600  Weight: 171 lb 4.8 oz (77.701 kg) 170 lb 8 oz (77.338 kg) 167 lb 12.3 oz (76.1 kg)    PHYSICAL EXAM General: Well developed, well nourished, male in no acute distress. Head: Normocephalic, atraumatic.moist mucous membranes  Neck: Supple, No JVD. Lungs:  Resp regular and unlabored, no accessory muscle use Heart: RRR, S1, S2, no S3, S4, or murmur; no rubs or gallops Abdomen: Soft, non-tender, non-distended, BS + x 4.  Extremities: No clubbing, cyanosis trace pedal edema b/l Neuro: Alert and oriented X 3. Moves all extremities spontaneously. Psych: Normal affect.   LABS: CBC:  Recent Labs  08/31/15 0344 09/01/15 0227  WBC 10.6* 8.9  HGB 11.4* 10.9*  HCT 34.7* 32.9*  MCV 94.0 92.2  PLT 439* 468*   INR:  Recent Labs  08/31/15 0344  INR 1.23   Basic Metabolic  Panel:  Recent Labs  08/31/15 0344 09/01/15 0227  NA 138 136  K 3.9 3.7  CL 103 101  CO2 26 26  GLUCOSE 119* 104*  BUN 25* 24*  CREATININE 1.00 0.99  CALCIUM 9.8 9.1    Current facility-administered medications:  .  0.9 %  sodium chloride infusion, 250 mL, Intravenous, PRN, Peter M [place], MD .  acetaminophen (TYLENOL) tablet 650 mg, 650 mg, Oral, Q6H PRN **OR** acetaminophen (TYLENOL) suppository 650 mg, 650 mg, Rectal, Q6H PRN, Fuller Plan, MD .  antiseptic oral rinse (CPC / CETYLPYRIDINIUM CHLORIDE 0.05%) solution 7 mL, 7 mL, Mouth Rinse, BID, Inez Catalina, MD, 7 mL at 08/31/15 2200 .  aspirin EC tablet 81 mg, 81 mg, Oral, Daily, Fuller Plan, MD, 81 mg at 08/30/15 1049 .  atorvastatin (LIPITOR) tablet 80 mg, 80 mg, Oral, q1800, Peter M [place], MD, 80 mg at 08/31/15 1814 .  carvedilol (COREG) tablet 18.75 mg, 18.75 mg, Oral, BID WC, Antoine Poche, MD, 18.75 mg at 09/01/15 1308 .  clopidogrel (PLAVIX) tablet 75 mg, 75 mg, Oral, Q breakfast, Peter M [place], MD, 75 mg at 09/01/15 0744 .  dextromethorphan-guaiFENesin (MUCINEX DM) 30-600 MG per 12 hr tablet 1 tablet, 1 tablet, Oral, BID, Fuller Plan, MD,  1 tablet at 08/31/15 2124 .  dorzolamide-timolol (COSOPT) 22.3-6.8 MG/ML ophthalmic solution 1 drop, 1 drop, Left Eye, BID, Fuller Planhristopher W Rice, MD, 1 drop at 08/31/15 2130 .  furosemide (LASIX) injection 40 mg, 40 mg, Intravenous, Q12H, Peter M [place]az[place]Jordan, MD, 40 mg at 09/01/15 [id number] .  guaiFENesin-dextromethorphan (ROBITUSSIN DM) 100-10 MG/5ML syrup 5 mL, 5 mL, Oral, Q4H PRN, Valentino NoseNathan Boswell, MD, 5 mL at 08/29/15 2025 .  ipratropium-albuterol (DUONEB) 0.5-2.5 (3) MG/3ML nebulizer solution 3 mL, 3 mL, Nebulization, Q4H PRN, Inez CatalinaEmily B Mullen, MD, 3 mL at 08/28/15 1719 .  ipratropium-albuterol (DUONEB) 0.5-2.5 (3) MG/3ML nebulizer solution 3 mL, 3 mL, Nebulization, TID, Inez CatalinaEmily B Mullen, MD, 3 mL at 09/01/15 0751 .  isosorbide mononitrate (IMDUR) 24 hr tablet 30 mg, 30 mg,  Oral, Daily, Peter M [place]Jordan, MD, 30 mg at 08/31/15 1845 .  losartan (COZAAR) tablet 25 mg, 25 mg, Oral, Daily, Antoine PocheJonathan F Branch, MD, 25 mg at 08/31/15 1051 .  potassium chloride SA (K-DUR,KLOR-CON) CR tablet 40 mEq, 40 mEq, Oral, Daily, Tasrif Ahmed, MD, 40 mEq at 08/31/15 1051 .  rivaroxaban (XARELTO) tablet 20 mg, 20 mg, Oral, Q supper, Peter M [place]Jordan, MD .  sodium chloride flush (NS) 0.9 % injection 3 mL, 3 mL, Intravenous, Q12H, Peter M [place]Jordan, MD, 3 mL at 08/31/15 2200 .  sodium chloride flush (NS) 0.9 % injection 3 mL, 3 mL, Intravenous, PRN, Peter M [place]Jordan, MD .  spironolactone (ALDACTONE) tablet 25 mg, 25 mg, Oral, Daily, Peter M [place]Jordan, MD .  traZODone (DESYREL) tablet 50 mg, 50 mg, Oral, QHS PRN, Valentino NoseNathan Boswell, MD, 50 mg at 08/28/15 2125 .  zolpidem (AMBIEN) tablet 5 mg, 5 mg, Oral, Once, Fuller Planhristopher W Rice, MD, 5 mg at 08/25/15 0138    TELE: NSR       ECG: NSR  Transthoracic Echocardiography 08/25/15 - Procedure narrative: Transthoracic echocardiography. Image  quality was adequate. The study was technically difficult. - Left ventricle: The cavity size was normal. There was mild focal  basal hypertrophy of the septum. Systolic function was normal.  Wall motion was normal; there were no regional wall motion  abnormalities. The study is not technically sufficient to allow  evaluation of LV diastolic function. - Aortic valve: Transvalvular velocity was within the normal range.  There was no stenosis. There was mild regurgitation. - Mitral valve: Calcified annulus. There was mild regurgitation. - Left atrium: The atrium was moderately dilated. - Right ventricle: The cavity size was normal. Wall thickness was  normal. Systolic function was normal. - Tricuspid valve: There was mild regurgitation. - Pulmonary arteries: Systolic pressure was moderately increased.  PA peak pressure: 49 mm Hg (S). - Inferior vena cava: The vessel was normal in size. The  respirophasic  diameter changes were blunted (< 50%), consistent  with normal central venous pressure.  Current Medications:  . antiseptic oral rinse  7 mL Mouth Rinse BID  . aspirin EC  81 mg Oral Daily  . atorvastatin  80 mg Oral q1800  . carvedilol  18.75 mg Oral BID WC  . clopidogrel  75 mg Oral Q breakfast  . dextromethorphan-guaiFENesin  1 tablet Oral BID  . dorzolamide-timolol  1 drop Left Eye BID  . furosemide  40 mg Intravenous Q12H  . ipratropium-albuterol  3 mL Nebulization TID  . isosorbide mononitrate  30 mg Oral Daily  . losartan  25 mg Oral Daily  . potassium chloride  40 mEq Oral Daily  . rivaroxaban  20 mg Oral  Q supper  . sodium chloride flush  3 mL Intravenous Q12H  . spironolactone  25 mg Oral Daily  . zolpidem  5 mg Oral Once      ASSESSMENT AND PLAN: Active Problems:   CAP (community acquired pneumonia)   Community acquired pneumonia   Elevated troponin   Hypoxia   Paroxysmal atrial fibrillation (HCC)   Unstable angina pectoris (HCC)   Demand ischemia of myocardium (HCC)   NSTEMI (non-ST elevated myocardial infarction) (HCC)  1. Paroxysmal atrial fibrillation: He is rate controlled. Continue BB, switched pradaxa to xarelto due to less bleeding risk as he will be on triple antiplatelet therapy x 1 month. He is allergic to eliquis.   2. NSTEMI: troponin elevated up to 1.15, left heart cath revealed 3 vessel obstructive CAD with predominant branch vessel disease. This includes the first and second diagonal, PLOM and RV marginal branches of the RCA. The LCx lesion appears to be the culprit vessel w/ DES to mid distal LCx.  - asa, xarelto, and plavix x 1 month after which he can stop asa and continue xarelto and plavix x 1 year and then consider stopping plavix   3. Hypoxia: Patient requiring supplemental O2 due to PNA and pulmonary edema. Continue to wean O2.   4. Community acquired pneumonia: Management per primary team.   5. Pulmonary edema: EDP elevated at  noted on LHC yesterday, BNP elevated at 1070 on admission. Weight is down 3 lbs and he put out 3.1L urine yesterday. Continue w/ lasix 40mg  BID, monitor volume status for an additional day or so.   6. HLD: increased to high intensity statin lipitor 80mg .   Signed, Gara Kroner , MD 9:14 AM 09/01/2015  Patient seen and examined and history reviewed. Agree with above findings and plan. Patient is feeling much better today. Decreased cough and SOB. Good response to diuresis. Lungs are clearer. He is maintaining NSR. Plan to resume anticoagulation today with Xarelto. With stent yesterday will continue ASA and Plavix for one month then stop ASA. Resume eplerenone. Reviewed finding of cardiac cath again with him today. If doing well will switch to po lasix tomorrow and aim for DC Thursday.  Peter [place], MDFACC 09/01/2015 3:35 PM

## 2015-09-01 NOTE — Progress Notes (Signed)
  Date: 09/01/2015  Patient name: Ivan ArisCharles Yousuf  Medical record number: [id number]  Date of birth: 03/28/1936   This patient has been seen and the plan of care was discussed with the house staff. Please see Dr. Bonney RousselBoswell's note for complete details. I concur with his findings with the following additions/corrections: Ambulation with cardiac rehab went well, did not require O2.  Inez CatalinaEmily B Mullen, MD 09/01/2015, 3:13 PM

## 2015-09-01 NOTE — Progress Notes (Signed)
Subjective: [name] was seen and examined during morning rounds.  This morning he was able to sat at 98% on room air without additional oxygen requirements.  He still complains of a cough when lying flat.  Cardiology came in and spoke with him this morning about cath findings.  He likely had an NSTEMI with occlusion of L circumflex artery causing CP.  He also had occlusion of diagonal vessels of RCA and occlusion of RCA, which has developed collateral flow.  Cards spoke with him about starting Plavix (1 year) plus aspirin (1 month), and switching Pradaxa to Xarelto.  They would also like to switch his pravastatin to a more powerful statin.  Cards would like to keep patient in hospital for another two days for diuresis before discharging.  Patient is amenable to plan and cardiology answered all questions he had.   Objective: Vital signs in last 24 hours: Filed Vitals:   09/01/15 0600 09/01/15 0753 09/01/15 0830 09/01/15 0945  BP: 130/70  132/57 121/56  Pulse: 74  82 75  Temp:   97.3 F (36.3 C)   TempSrc:   Axillary   Resp: Height:      Weight: 76.1 kg (167 lb 12.3 oz)     SpO2: 96% 98% 96% 96%   Weight change: -1.238 kg (-2 lb 11.7 oz)  Intake/Output Summary (Last 24 hours) at 09/01/15 1037 Last data filed at 09/01/15 0912  Gross per 24 hour  Intake    840 ml  Output   2900 ml  Net  -2060 ml   General: Well-appearing, conversant.  No longer on nasal cannula.  Respiratory: Clear, no wheezing.  Heart: RRR. No murmurs Extremities: 2+ edema to ankles bilaterally.  Warm, dry.    Lab Results: CBC    Component Value Date/Time   WBC 8.9 09/01/2015 0227   RBC 3.57* 09/01/2015 0227   HGB 10.9* 09/01/2015 0227   HCT 32.9* 09/01/2015 0227   PLT 468* 09/01/2015 0227   MCV 92.2 09/01/2015 0227   MCH 30.5 09/01/2015 0227   MCHC 33.1 09/01/2015 0227   RDW 12.6 09/01/2015 0227   LYMPHSABS 0.9 08/25/2015 0250   MONOABS 1.6* 08/25/2015 0250   EOSABS 0.0 08/25/2015 0250   BASOSABS 0.0 08/25/2015 0250    BMP Latest Ref Rng 09/01/2015 08/31/2015 08/30/2015  Glucose 65 - 99 mg/dL 657(Q) 469(G) 295(M)  BUN 6 - 20 mg/dL 84(X) 32(G) 40(N)  Creatinine 0.61 - 1.24 mg/dL 0.27 2.53 6.64  Sodium 135 - 145 mmol/L 136 138 139  Potassium 3.5 - 5.1 mmol/L 3.7 3.9 3.8  Chloride 101 - 111 mmol/L 101 103 99(L)  CO2 22 - 32 mmol/L Calcium 8.9 - 10.3 mg/dL 9.1 9.8 9.8      Micro Results: Recent Results (from the past 240 hour(s))  Blood Culture (routine x 2)     Status: None   Collection Time: 08/24/15  7:22 PM  Result Value Ref Range Status   Specimen Description BLOOD LEFT HAND  Final   Special Requests IN PEDIATRIC BOTTLE 3CC  Final   Culture NO GROWTH 5 DAYS  Final   Report Status 08/29/2015 FINAL  Final  Blood Culture (routine x 2)     Status: None   Collection Time: 08/24/15  8:11 PM  Result Value Ref Range Status   Specimen Description BLOOD LEFT ANTECUBITAL  Final   Special Requests BOTTLES DRAWN AEROBIC AND ANAEROBIC 5CC  Final   Culture NO  GROWTH 5 DAYS  Final   Report Status 08/29/2015 FINAL  Final  Urine culture     Status: None   Collection Time: 08/24/15 11:27 PM  Result Value Ref Range Status   Specimen Description URINE, RANDOM  Final   Special Requests NONE  Final   Culture NO GROWTH  Final   Report Status 08/26/2015 FINAL  Final  MRSA PCR Screening     Status: None   Collection Time: 08/24/15 11:28 PM  Result Value Ref Range Status   MRSA by PCR NEGATIVE NEGATIVE Final    Comment:        The GeneXpert MRSA Assay (FDA approved for NASAL specimens only), is one component of a comprehensive MRSA colonization surveillance program. It is not intended to diagnose MRSA infection nor to guide or monitor treatment for MRSA infections.   Culture, sputum-assessment     Status: None   Collection Time: 08/25/15  2:50 PM  Result Value Ref Range Status   Specimen Description SPUTUM  Final   Special Requests Normal  Final   Sputum  evaluation   Final    THIS SPECIMEN IS ACCEPTABLE. RESPIRATORY CULTURE REPORT TO FOLLOW.   Report Status 08/25/2015 FINAL  Final  Culture, respiratory (NON-Expectorated)     Status: None   Collection Time: 08/25/15  3:09 PM  Result Value Ref Range Status   Specimen Description SPUTUM  Final   Special Requests NONE  Final   Gram Stain   Final    MODERATE WBC PRESENT,BOTH PMN AND MONONUCLEAR FEW SQUAMOUS EPITHELIAL CELLS PRESENT MODERATE GRAM POSITIVE COCCI IN PAIRS MODERATE GRAM NEGATIVE RODS THIS SPECIMEN IS ACCEPTABLE FOR SPUTUM CULTURE Performed at [facility]    Culture   Final    NORMAL OROPHARYNGEAL FLORA Performed at [facility]    Report Status 08/28/2015 FINAL  Final   Studies/Results: No results found. Medications: I have reviewed the patient's current medications. Scheduled Meds: . antiseptic oral rinse  7 mL Mouth Rinse BID  . aspirin EC  81 mg Oral Daily  . atorvastatin  80 mg Oral q1800  . carvedilol  18.75 mg Oral BID WC  . clopidogrel  75 mg Oral Q breakfast  . dextromethorphan-guaiFENesin  1 tablet Oral BID  . dorzolamide-timolol  1 drop Left Eye BID  . eplerenone  25 mg Oral Daily  . furosemide  40 mg Intravenous Q12H  . ipratropium-albuterol  3 mL Nebulization TID  . isosorbide mononitrate  30 mg Oral Daily  . losartan  25 mg Oral Daily  . potassium chloride  40 mEq Oral Daily  . rivaroxaban  20 mg Oral Q supper  . sodium chloride flush  3 mL Intravenous Q12H  . zolpidem  5 mg Oral Once   Continuous Infusions:  PRN Meds:.sodium chloride, acetaminophen **OR** acetaminophen, guaiFENesin-dextromethorphan, ipratropium-albuterol, sodium chloride flush, traZODone Assessment/Plan: Strep pneumo PNA:  Tolerated 1L O2 yesterday, now off of oxygen and satting at 98%. Finished antibiotics for pneumonia two days ago.  Lower extremity edema improved since yesterday, but continues to have edema to ankles.  Per cards note, patient had difficulty  lying flat during cath and had SOB/cough during procedure.  Cards recommends keeping patient for two more days, with plans to diuresis with IV lasix today and transistion to oral Lasix tomorrow. -Currently not requiring oxygen.  If sats fall below 92%, give oxygen as needed until sats > 92% -Lasix 40 mg bid IV -Robitussin PRN -Tylenol PRN  -Fluid restriction (1200 mL  daily)  -nebulizer treatments PRN   CAD: Denies any chest pain at this time. Has been in and out of a-fib, a-flutter, PACs during hospitalization, currently in NSR.  Also had elevated troponins on admission.  Pt underwent L heart cath yesterday which showed: 1. 3 vessel obstructive CAD with predominant branch vessel disease. This includes the first and second diagonal, PLOM and RV marginal branches of the RCA. The LCx lesion appears to be the culprit vessel, 2. Mild LV dysfunction with elevated LV EDP, 3. Successful stenting of the mid-distal LCx with DES. Given findings from cath, pt likely had NSTEMI prior to admission that caused his chest pain and may have put him back into a-fib.  Drug eluting stent placed.  Cardiology talked to patient about their findings and importance of starting Plavix for 1 year and ASA for 1 month.  They will also take pt off of Pradaxa and start Xarelto because it has a better bleeding profile.  Pt is allergic to Eliquis so that is not an option.  Pt also transitioned to Lipitor because it is a stronger statin than pravastatin.  Pt will also be put on Imdur nitrate therapy.  Pt seems to have a good understanding of cath findings and plans for CAD management.  He can follow up with Dr. Melvyn N[place]Lewis in [place] as outpatient.    -Cards following, appreciate recs -Start Lipitor 80 mg daily (discontinue pravastatin)  -Start Xarelto 20mg  daily (discontinue Pradaxa)  -Imdur nitrate 30mg  tablet daily -Plavix 75 mg daily (1 year) -ASA 81 mg daily  (1 month) -Restart eplerenone 25 mg daily  -Coreg 18.75 mg bid    -Losartan 25 mg daily  -Lasix 40 mg bid IV  Hypertension: BP well-controlled in 120s. Cards has restarted patient on epleronone.  -Continue Coreg 18.75 mg bid  -Continue losartan 25mg  daily -Eplerenone 25mg  daily -Lasix 40 mg bid IV   This is a Psychologist, occupationalMedical Student Note.  The care of the patient was discussed with Dr. Valentino NoseNathan Boswell and the assessment and plan formulated with their assistance.  Please see their attached note for official documentation of the daily encounter.   LOS: 8 days   Nathaneil Canaryolleen M [place] Urieta, Med Student 09/01/2015, 10:37 AM

## 2015-09-01 NOTE — Progress Notes (Signed)
Subjective: No events overnight. Satting well on room air this morning. Denies any complaints this morning. Still having difficulty breathing when lying flat. Had cath done yesterday with stent placed. Denies any chest pain or palpitations.    Objective: Vital signs in last 24 hours: Filed Vitals:   09/01/15 0200 09/01/15 0352 09/01/15 0400 09/01/15 0600  BP: 123/54 130/64 127/60 130/70  Pulse: 67 74 71 74  Temp:  98.3 F (36.8 C)    TempSrc:  Oral    Resp: Height:      Weight:    167 lb 12.3 oz (76.1 kg)  SpO2: 94% 97% 95% 96%   Weight change: -2 lb 11.7 oz (-1.238 kg)  Intake/Output Summary (Last 24 hours) at 09/01/15 0723 Last data filed at 09/01/15 0354  Gross per 24 hour  Intake    600 ml  Output   3150 ml  Net  -2550 ml    General: alert, well-developed, and cooperative to examination.  Neck: supple, full ROM, no carotid bruits. No JVD appreciated.  Lungs: normal respiratory effort, no accessory muscle use, mild bibasilar crackles Heart: RRR, no murmur, no gallop, and no rub.  Abdomen: soft, non-tender, normal bowel sounds, no distention Pulses: 2+ DP/PT pulses bilaterally Extremities: No cyanosis, worsened edema b/l LE, 2+ pitting edema to knees  Psych: normal mood and affect  Medications: I have reviewed the patient's current medications. Scheduled Meds: . antiseptic oral rinse  7 mL Mouth Rinse BID  . aspirin EC  81 mg Oral Daily  . atorvastatin  80 mg Oral q1800  . carvedilol  18.75 mg Oral BID WC  . clopidogrel  75 mg Oral Q breakfast  . dextromethorphan-guaiFENesin  1 tablet Oral BID  . dorzolamide-timolol  1 drop Left Eye BID  . furosemide  40 mg Intravenous Q12H  . ipratropium-albuterol  3 mL Nebulization TID  . isosorbide mononitrate  30 mg Oral Daily  . losartan  25 mg Oral Daily  . potassium chloride  40 mEq Oral Daily  . sodium chloride flush  3 mL Intravenous Q12H  . zolpidem  5 mg Oral Once   Continuous Infusions:   PRN  Meds:.sodium chloride, acetaminophen **OR** acetaminophen, guaiFENesin-dextromethorphan, ipratropium-albuterol, sodium chloride flush, traZODone Assessment/Plan:  CAP: Doing well on room air this morining. Finished 5 day course of Azithromycin and 7 day course of Ceftriaxone. Lungs CTAB.  -Oxygen therapy as needed goal SpO2 >90% -Ceftriaxone 2g IV 5/15>>> 5/21 -Azithromycin  5/15>>>5/19 -Ambulate with O2 sats  PAF likely causing some transient CHF CHAD-VASc 3. History of A. Fib s/p ablation. Currently with PAF. Started Pradaxa. Rate controlled with Coreg. -Appreciate cardiology help -starting xarelto  - coreg 18.75 bid per cards.  CAD with NSTEMI: Peaked at 1.15 at admission. Left heart cath revealed 3 vessel obstructive CAD with predominant branch vessel disease. This includes the first and second diagonal, PLOM and RV marginal branches of the RCA. The LCx lesion appears to be the culprit vessel w/ DES to mid distal LCx.  -Appreciate cardiology help -Xarelto due to less bleeding risk with DAP -ASA x 1 month -Plavix x 1 year -Change statin to Atorvastatin 80 mg qhs -Start Imdur 30 mg daily -Lasix 40 mg IV bid, transition to PO tomorrow  Pulmonary edema: EDP elevated at noted on LHC yesterday, BNP elevated at 1070 on admission. Weight is down 3 lbs and he put out 3.1L urine yesterday. Continue w/ lasix  BID, monitor volume status for  an additional day or so.   Hypokalemia: K 3.7 today. Has been receiving lasix during hospitalization.  -KDur 40mEq daily  HFpEF: [facility] diastolic dysfunction and LVH without significantly reduced EF. Previous stress studies consistently false positives with negative subsequent caths so likely NICM. ECHO done yesterday with no LV dysfunction but unable to determine EF. Unable to determine if diastolic dysfunction present. PAP mildly elevated at 49. -ASA 81mg  -cardiology consulted, appreciate  the consult -coreg 18.75 bid per cards. -lasix as above.   HTN: BP stable to soft. On many agents PTA amlodipine 10mg , clonidine 0.1mg  TID, eplerenone 50mg , losartan 50mg . Will hold these. BP labile. -Consider restarting home meds if BP becomes uncontrolled -Continue Coreg  HLD: Stable chronic problem. -Continue home pravastatin 40mg , fenofibrate 160mg .  OSA:. qHS CPAP.  FULL CODE  Diet: Heart Healthy  VTE ppx: Kings enoxaparin  Dispo: Disposition is deferred at this time, awaiting improvement of current medical problems.    The patient does have a current PCP (No primary care provider on file.) and [facility].  The patient does not have transportation limitations that hinder transportation to clinic appointments.  .Services Needed at time of discharge: Y = Yes, Blank = No PT:   OT:   RN:   Equipment:   Other:     LOS: 8 days   Valentino NoseNathan Kinley Dozier, MD IMTS PGY-1 [phone] 09/01/2015, 7:23 AM

## 2015-09-01 NOTE — Progress Notes (Signed)
PT Cancellation Note  Patient Details Name: Marsa ArisCharles Riese MRN: [id number] DOB: 09/22/1935   Cancelled Treatment:    Reason Eval/Treat Not Completed: Patient at procedure or test/unavailable. Working with Cardiac Rehab. Will attempt to see later today   Kajol Crispen 09/01/2015, 10:19 AM Pager [phone]

## 2015-09-01 NOTE — Progress Notes (Signed)
Pt allergic to spirlactone --rash and itching, ordered to give pt today. Notified Dr SwazilandJordan, he will d/c and order pt's home med he takes--eplerenone.

## 2015-09-01 NOTE — Progress Notes (Signed)
Pt in w pneumonia. To start on xarelto. Left pt 30day free xarelto card in room.

## 2015-09-02 LAB — BASIC METABOLIC PANEL
ANION GAP: 11 (ref 5–15)
BUN: 25 mg/dL — ABNORMAL HIGH (ref 6–20)
CHLORIDE: 103 mmol/L (ref 101–111)
CO2: 23 mmol/L (ref 22–32)
CREATININE: 0.98 mg/dL (ref 0.61–1.24)
Calcium: 9.3 mg/dL (ref 8.9–10.3)
GFR calc non Af Amer: >60 mL/min (ref >60)
Glucose, Bld: 125 mg/dL — ABNORMAL HIGH (ref 65–99)
Potassium: 3.7 mmol/L (ref 3.5–5.1)
SODIUM: 137 mmol/L (ref 135–145)

## 2015-09-02 LAB — CBC
HEMATOCRIT: 34 % — AB (ref 39.0–52.0)
HEMOGLOBIN: 11.3 g/dL — AB (ref 13.0–17.0)
MCH: 30.5 pg (ref 26.0–34.0)
MCHC: 33.2 g/dL (ref 30.0–36.0)
MCV: 91.9 fL (ref 78.0–100.0)
Platelets: 482 10*3/uL — ABNORMAL HIGH (ref 150–400)
RBC: 3.7 MIL/uL — AB (ref 4.22–5.81)
RDW: 12.6 % (ref 11.5–15.5)
WBC: 9.9 10*3/uL (ref 4.0–10.5)

## 2015-09-02 MED ORDER — EPLERENONE 25 MG PO TABS
50.0000 mg | ORAL_TABLET | Freq: Every day | ORAL | Status: DC
  Administered 2015-09-02 – 2015-09-03 (×2): 50 mg via ORAL
  Filled 2015-09-02 (×2): qty 2

## 2015-09-02 MED ORDER — LOSARTAN POTASSIUM 50 MG PO TABS
50.0000 mg | ORAL_TABLET | Freq: Every day | ORAL | Status: DC
  Administered 2015-09-02 – 2015-09-03 (×2): 50 mg via ORAL
  Filled 2015-09-02 (×2): qty 1

## 2015-09-02 MED ORDER — FUROSEMIDE 40 MG PO TABS
40.0000 mg | ORAL_TABLET | Freq: Two times a day (BID) | ORAL | Status: DC
  Administered 2015-09-02 – 2015-09-03 (×3): 40 mg via ORAL
  Filled 2015-09-02 (×3): qty 1

## 2015-09-02 NOTE — Patient Instructions (Signed)
High Stepping    Using support, lift knees, taking high steps. Repeat __10__ times. Do __2__ sessions per day.  http://gt2.exer.us/533   Copyright  VHI. All rights reserved.  Tandem Standing    Stand heel to toe near the counter on one side and chair back on the other side: 1.Look right _5__ times. 2. Look left _5__ times. 3. Look up __5_ times. 4. With right arm, reach right, left, forward and back. Repeat __1__ times. Do ___2_ sessions per day.  http://gt2.exer.us/541   Copyright  VHI. All rights reserved.  Sit to Stand: Head Upright    With head upright, stand up slowly with eyes open. Repeat _10___ times per session. Do _2___ sessions per day.  Copyright  VHI. All rights reserved.  Side-Stepping    Walk to left side with eyes open. Take even steps, leading with same foot. Make sure each foot lifts off the floor. Repeat in opposite direction. Repeat for _1___ minutes per session. Do __2__ sessions per day.   Copyright  VHI. All rights reserved.  Feet Heel-Toe "Tandem"    Arms outstretched and near a counter or wall, walk a straight line bringing one foot directly in front of the other. Repeat for __30__ seconds per session. Do __2__ sessions per day.  Copyright  VHI. All rights reserved.

## 2015-09-02 NOTE — Progress Notes (Signed)
Patient Name: Ivan Mccoy Date of Encounter: 09/02/2015  Active Problems:   CAP (community acquired pneumonia)   Community acquired pneumonia   Hypoxia   Paroxysmal atrial fibrillation (HCC)   NSTEMI (non-ST elevated myocardial infarction) (HCC)   Stented coronary artery   Acute combined systolic and diastolic CHF, NYHA class 3 (HCC)   Patient Profile: 80 year old male with a past medical history of PAF. Presented to ED on 08/26/15 with SOB, cough and subjective fever and chills. Being treated for CAP, had elevated troponin s/p cath 5/22  SUBJECTIVE: No complaints this morning. Doing well.    OBJECTIVE Filed Vitals:   09/02/15 0000 09/02/15 0400 09/02/15 0404 09/02/15 0735  BP: 117/63 124/64 124/64 120/60  Pulse: 78 78 82 76  Temp:   98.3 F (36.8 C) 98.5 F (36.9 C)  TempSrc:   Oral Oral  Resp: Height:      Weight:   173 lb 11.6 oz (78.8 kg)   SpO2: 97% 93% 93% 100%    Intake/Output Summary (Last 24 hours) at 09/02/15 0746 Last data filed at 09/02/15 0500  Gross per 24 hour  Intake    780 ml  Output   1300 ml  Net   -520 ml   Filed Weights   08/31/15 0618 09/01/15 0600 09/02/15 0404  Weight: 170 lb 8 oz (77.338 kg) 167 lb 12.3 oz (76.1 kg) 173 lb 11.6 oz (78.8 kg)    PHYSICAL EXAM General: Well developed, well nourished, male in no acute distress. Head: Normocephalic, atraumatic.moist mucous membranes  Neck: Supple, No JVD. Lungs:  Resp regular and unlabored, no accessory muscle use Heart: RRR, S1, S2, no S3, S4, or murmur; no rubs or gallops Abdomen: Soft, non-tender, non-distended, BS + x 4.  Extremities: No clubbing, cyanosis trace pedal edema b/l Neuro: Alert and oriented X 3. Moves all extremities spontaneously. Psych: Normal affect.   LABS: CBC:  Recent Labs  09/01/15 0227 09/02/15 0534  WBC 8.9 9.9  HGB 10.9* 11.3*  HCT 32.9* 34.0*  MCV 92.2 91.9  PLT 468* 482*   INR:  Recent Labs  08/31/15 0344  INR 1.23   Basic  Metabolic Panel:  Recent Labs  16/10/96 0227 09/02/15 0534  NA 136 137  K 3.7 3.7  CL 101 103  CO2 26 23  GLUCOSE 104* 125*  BUN 24* 25*  CREATININE 0.99 0.98  CALCIUM 9.1 9.3    Current facility-administered medications:  .  0.9 %  sodium chloride infusion, 250 mL, Intravenous, PRN, Kenslee Achorn M [place], MD .  acetaminophen (TYLENOL) tablet 650 mg, 650 mg, Oral, Q6H PRN **OR** acetaminophen (TYLENOL) suppository 650 mg, 650 mg, Rectal, Q6H PRN, Fuller Plan, MD .  antiseptic oral rinse (CPC / CETYLPYRIDINIUM CHLORIDE 0.05%) solution 7 mL, 7 mL, Mouth Rinse, BID, Inez Catalina, MD, 7 mL at 08/31/15 2200 .  aspirin EC tablet 81 mg, 81 mg, Oral, Daily, Fuller Plan, MD, 81 mg at 09/01/15 0945 .  atorvastatin (LIPITOR) tablet 80 mg, 80 mg, Oral, q1800, Sabriyah Wilcher M [place], MD, 80 mg at 09/01/15 1755 .  carvedilol (COREG) tablet 18.75 mg, 18.75 mg, Oral, BID WC, Antoine Poche, MD, 18.75 mg at 09/02/15 0603 .  clopidogrel (PLAVIX) tablet 75 mg, 75 mg, Oral, Q breakfast, Dorthey Depace M [place], MD, 75 mg at 09/01/15 0744 .  dextromethorphan-guaiFENesin (MUCINEX DM) 30-600 MG per 12 hr tablet 1 tablet, 1 tablet, Oral, BID, Jamesetta Orleans Rice,  MD, 1 tablet at 09/01/15 2117 .  dorzolamide-timolol (COSOPT) 22.3-6.8 MG/ML ophthalmic solution 1 drop, 1 drop, Left Eye, BID, Fuller Planhristopher W Rice, MD, 1 drop at 09/01/15 2003 .  eplerenone (INSPRA) tablet 25 mg, 25 mg, Oral, Daily, Kaarin Pardy M [place], MD, 25 mg at 09/01/15 1234 .  furosemide (LASIX) injection 40 mg, 40 mg, Intravenous, Q12H, Khalib Fendley M [place], MD, 40 mg at 09/02/15 0604 .  guaiFENesin-dextromethorphan (ROBITUSSIN DM) 100-10 MG/5ML syrup 5 mL, 5 mL, Oral, Q4H PRN, Valentino NoseNathan Boswell, MD, 5 mL at 08/29/15 2025 .  ipratropium-albuterol (DUONEB) 0.5-2.5 (3) MG/3ML nebulizer solution 3 mL, 3 mL, Nebulization, Q4H PRN, Inez CatalinaEmily B Mullen, MD, 3 mL at 08/28/15 1719 .  ipratropium-albuterol (DUONEB) 0.5-2.5 (3) MG/3ML nebulizer solution 3 mL, 3 mL, Nebulization,  TID, Inez CatalinaEmily B Mullen, MD, 3 mL at 09/01/15 1925 .  isosorbide mononitrate (IMDUR) 24 hr tablet 30 mg, 30 mg, Oral, Daily, Agness Sibrian M [place], MD, 30 mg at 09/01/15 0945 .  losartan (COZAAR) tablet 25 mg, 25 mg, Oral, Daily, Antoine PocheJonathan F Branch, MD, 25 mg at 09/01/15 0945 .  potassium chloride SA (K-DUR,KLOR-CON) CR tablet 40 mEq, 40 mEq, Oral, Daily, Tasrif Ahmed, MD, 40 mEq at 09/01/15 0945 .  rivaroxaban (XARELTO) tablet 20 mg, 20 mg, Oral, Q supper, Hilding Quintanar M [place], MD, 20 mg at 09/01/15 1755 .  sodium chloride flush (NS) 0.9 % injection 3 mL, 3 mL, Intravenous, Q12H, Meena Barrantes M [place], MD, 3 mL at 09/01/15 2003 .  sodium chloride flush (NS) 0.9 % injection 3 mL, 3 mL, Intravenous, PRN, Earlisha Sharples M [place], MD .  traZODone (DESYREL) tablet 50 mg, 50 mg, Oral, QHS PRN, Valentino NoseNathan Boswell, MD, 50 mg at 08/28/15 2125 .  zolpidem (AMBIEN) tablet 5 mg, 5 mg, Oral, Once, Fuller Planhristopher W Rice, MD, 5 mg at 08/25/15 0138    TELE: NSR       ECG: NSR  Transthoracic Echocardiography 08/25/15 - Procedure narrative: Transthoracic echocardiography. Image  quality was adequate. The study was technically difficult. - Left ventricle: The cavity size was normal. There was mild focal  basal hypertrophy of the septum. Systolic function was normal.  Wall motion was normal; there were no regional wall motion  abnormalities. The study is not technically sufficient to allow  evaluation of LV diastolic function. - Aortic valve: Transvalvular velocity was within the normal range.  There was no stenosis. There was mild regurgitation. - Mitral valve: Calcified annulus. There was mild regurgitation. - Left atrium: The atrium was moderately dilated. - Right ventricle: The cavity size was normal. Wall thickness was  normal. Systolic function was normal. - Tricuspid valve: There was mild regurgitation. - Pulmonary arteries: Systolic pressure was moderately increased.  PA peak pressure: 49 mm Hg (S). - Inferior vena cava: The  vessel was normal in size. The  respirophasic diameter changes were blunted (< 50%), consistent  with normal central venous pressure.  Current Medications:  . antiseptic oral rinse  7 mL Mouth Rinse BID  . aspirin EC  81 mg Oral Daily  . atorvastatin  80 mg Oral q1800  . carvedilol  18.75 mg Oral BID WC  . clopidogrel  75 mg Oral Q breakfast  . dextromethorphan-guaiFENesin  1 tablet Oral BID  . dorzolamide-timolol  1 drop Left Eye BID  . eplerenone  25 mg Oral Daily  . furosemide  40 mg Intravenous Q12H  . ipratropium-albuterol  3 mL Nebulization TID  . isosorbide mononitrate  30 mg Oral Daily  . losartan  25  mg Oral Daily  . potassium chloride  40 mEq Oral Daily  . rivaroxaban  20 mg Oral Q supper  . sodium chloride flush  3 mL Intravenous Q12H  . zolpidem  5 mg Oral Once      ASSESSMENT AND PLAN: Active Problems:   CAP (community acquired pneumonia)   Community acquired pneumonia   Hypoxia   Paroxysmal atrial fibrillation (HCC)   NSTEMI (non-ST elevated myocardial infarction) (HCC)   Stented coronary artery   Acute combined systolic and diastolic CHF, NYHA class 3 (HCC)  1. Paroxysmal atrial fibrillation: He is rate controlled. Continue BB, switched pradaxa to xarelto due to less bleeding risk as he will be on triple antiplatelet therapy x 1 month. He is allergic to eliquis.  2. NSTEMI: troponin elevated up to 1.15, left heart cath revealed 3 vessel obstructive CAD with predominant branch vessel disease. This includes the first and second diagonal, PLOM and RV marginal branches of the RCA. The LCx lesion appears to be the culprit vessel w/ DES to mid distal LCx.  - asa, xarelto, and plavix x 1 month after which he can stop asa and continue xarelto and plavix x 1 year and then consider stopping plavix  3. HTN- cont coreg, inspra increased back to home dose, and cozaar increased to 50. Stop clonidine and norvasc.  4. Community acquired pneumonia: Management per primary team.    5. Pulmonary edema: EDP elevated at noted on LHC. Weight ?inaccurate, he is net neg 4.4L since admission. Transition to lasix po 40mg  BID, likely d/c home tomorrow. 6. HLD: lipitor 80mg .    SignedGara Kroner , MD 7:46 AM 09/02/2015  Patient seen and examined and history reviewed. Agree with above findings and plan. Doing very well today. No dyspnea. Now off oxygen. Good diuresis. VSS. No afib. OK to transfer to telemetry today. Ambulate. Anticipate DC in am. I will contact his cardiologist in [place]. Will burn a CD of his Echo and Cath films for him to take. Transition to po lasix today.  Orvetta Danielski [place], MDFACC 09/02/2015 10:38 AM

## 2015-09-02 NOTE — Progress Notes (Signed)
Subjective: [name] was seen and examined during morning rounds.  He has been off oxygen for the past day and denies SOB.  He still has an intermittent cough.  He has been able to lie flat at night.  Cards PT took him on a long walk yesterday which he tolerated well without oxygen.  Plan to discharge tomorrow following Lasix.  We talked to him about changes to home medications and importance of following up with his PCP and cardiologist.   Objective: Vital signs in last 24 hours: Filed Vitals:   09/02/15 0000 09/02/15 0400 09/02/15 0404 09/02/15 0735  BP: 117/63 124/64 124/64 120/60  Pulse: 78 78 82 76  Temp:   98.3 F (36.8 C) 98.5 F (36.9 C)  TempSrc:   Oral Oral  Resp: 28 23 20 20   Height:      Weight:   78.8 kg (173 lb 11.6 oz)   SpO2: 97% 93% 93% 100%   Weight change: 2.7 kg (5 lb 15.2 oz)  Intake/Output Summary (Last 24 hours) at 09/02/15 0951 Last data filed at 09/02/15 0935  Gross per 24 hour  Intake   1110 ml  Output   1700 ml  Net   -590 ml   General: Well-appearing, conversant. Respiratory: Clear, no wheezing.  Heart: RRR. No murmurs Extremities: 2+ edema to ankles bilaterally. Warm, dry.   Lab Results: BMP Latest Ref Rng 09/02/2015 09/01/2015 08/31/2015  Glucose 65 - 99 mg/dL 409(W125(H) 119(J104(H) 478(G119(H)  BUN 6 - 20 mg/dL 95(A25(H) 21(H24(H) 08(M25(H)  Creatinine 0.61 - 1.24 mg/dL 5.780.98 4.690.99 6.291.00  Sodium 135 - 145 mmol/L 137 136 138  Potassium 3.5 - 5.1 mmol/L 3.7 3.7 3.9  Chloride 101 - 111 mmol/L 103 101 103  CO2 22 - 32 mmol/L 23 26 26   Calcium 8.9 - 10.3 mg/dL 9.3 9.1 9.8     Micro Results: Recent Results (from the past 240 hour(s))  Blood Culture (routine x 2)     Status: None   Collection Time: 08/24/15  7:22 PM  Result Value Ref Range Status   Specimen Description BLOOD LEFT HAND  Final   Special Requests IN PEDIATRIC BOTTLE 3CC  Final   Culture NO GROWTH 5 DAYS  Final   Report Status 08/29/2015 FINAL  Final  Blood Culture (routine x 2)     Status: None   Collection Time: 08/24/15  8:11 PM  Result Value Ref Range Status   Specimen Description BLOOD LEFT ANTECUBITAL  Final   Special Requests BOTTLES DRAWN AEROBIC AND ANAEROBIC 5CC  Final   Culture NO GROWTH 5 DAYS  Final   Report Status 08/29/2015 FINAL  Final  Urine culture     Status: None   Collection Time: 08/24/15 11:27 PM  Result Value Ref Range Status   Specimen Description URINE, RANDOM  Final   Special Requests NONE  Final   Culture NO GROWTH  Final   Report Status 08/26/2015 FINAL  Final  MRSA PCR Screening     Status: None   Collection Time: 08/24/15 11:28 PM  Result Value Ref Range Status   MRSA by PCR NEGATIVE NEGATIVE Final    Comment:        The GeneXpert MRSA Assay (FDA approved for NASAL specimens only), is one component of a comprehensive MRSA colonization surveillance program. It is not intended to diagnose MRSA infection nor to guide or monitor treatment for MRSA infections.   Culture, sputum-assessment     Status: None   Collection Time:  08/25/15  2:50 PM  Result Value Ref Range Status   Specimen Description SPUTUM  Final   Special Requests Normal  Final   Sputum evaluation   Final    THIS SPECIMEN IS ACCEPTABLE. RESPIRATORY CULTURE REPORT TO FOLLOW.   Report Status 08/25/2015 FINAL  Final  Culture, respiratory (NON-Expectorated)     Status: None   Collection Time: 08/25/15  3:09 PM  Result Value Ref Range Status   Specimen Description SPUTUM  Final   Special Requests NONE  Final   Gram Stain   Final    MODERATE WBC PRESENT,BOTH PMN AND MONONUCLEAR FEW SQUAMOUS EPITHELIAL CELLS PRESENT MODERATE GRAM POSITIVE COCCI IN PAIRS MODERATE GRAM NEGATIVE RODS THIS SPECIMEN IS ACCEPTABLE FOR SPUTUM CULTURE Performed at [facility]    Culture   Final    NORMAL OROPHARYNGEAL FLORA Performed at [facility]    Report Status 08/28/2015 FINAL  Final   Studies/Results: No results found. Medications: I have reviewed the patient's current  medications. Scheduled Meds: . antiseptic oral rinse  7 mL Mouth Rinse BID  . aspirin EC  81 mg Oral Daily  . atorvastatin  80 mg Oral q1800  . carvedilol  18.75 mg Oral BID WC  . clopidogrel  75 mg Oral Q breakfast  . dextromethorphan-guaiFENesin  1 tablet Oral BID  . dorzolamide-timolol  1 drop Left Eye BID  . eplerenone  50 mg Oral Daily  . furosemide  40 mg Oral BID  . ipratropium-albuterol  3 mL Nebulization TID  . isosorbide mononitrate  30 mg Oral Daily  . losartan  50 mg Oral Daily  . potassium chloride  40 mEq Oral Daily  . rivaroxaban  20 mg Oral Q supper  . sodium chloride flush  3 mL Intravenous Q12H  . zolpidem  5 mg Oral Once   Continuous Infusions:  PRN Meds:.sodium chloride, acetaminophen **OR** acetaminophen, guaiFENesin-dextromethorphan, ipratropium-albuterol, sodium chloride flush, traZODone Assessment/Plan: Strep pneumo PNA:  Off of O2 requirements for one day, no SOB.  Satting at 97% ORA.  Continues to have lower extremity edema to ankles, despite Lasix 40mg  bid IV yesterday.  Per cards recs, will transition to Lasix 40mg  bid oral today and will send home on this dose.  Tolerated long walk yesterday with cards PT.  Plan for discharge tomorrow, will transfer to floor bed without tele today. -Transfer to floor bed, discontinue tele monitoring  -Lasix 40 mg bid oral  -Upon discharge, will send out on Lasix 40mg  bid and K supplement  -Robitussin PRN -Tylenol PRN  -nebulizer treatments PRN   CAD: Denies any chest pain at this time, has been in NSR.  Plan to continue Xarelto, Plavix, ASA, nitrate, eplerenone, Coreg, Losartan, Lipitor. He can follow up with Dr. Melvyn Neth in [place] as outpatient.  -Cards following, appreciate recs -Lipitor 80 mg daily  -Xarelto 20mg  daily  -Imdur nitrate 30mg  tablet daily -Plavix 75 mg daily  -ASA 81 mg daily  -Eplerenone 25 mg daily  -Coreg 18.75 mg bid  -Losartan 25 mg daily  -Lasix 40 mg bid oral  Hypertension: BP  well-controlled in 110s-120s. Continue meds as above.  -Eplerenone 25 mg daily  -Coreg 18.75 mg bid  -Losartan 25 mg daily  -Lasix 40 mg bid oral   This is a Psychologist, occupational Note.  The care of the patient was discussed with Dr. Valentino Nose and the assessment and plan formulated with their assistance.  Please see their attached note for official documentation of the daily  encounter.   LOS: 9 days   Nathaneil Canary, Med Student 09/02/2015, 9:51 AM

## 2015-09-02 NOTE — Progress Notes (Signed)
CARDIAC REHAB PHASE I   PRE:  Rate/Rhythm: 81 SR    BP: sitting 107/53    SaO2:   MODE:  Ambulation: 700 ft   POST:  Rate/Rhythm: 97 SR    BP: sitting 128/59     SaO2: 91-92 RA  Pt ambulated 350 ft, rested 1 min, then ambulated another 350 ft. Some SOB and fatigue noted but seemed to tolerate better when he rested at half way (compared to yesterday). Reviewed ed with pt and family. Good questions asked. Pt eager to get going with CRPII. Encouraged him to [place]. [phone]   Harriet MassonRandi Kristan Darrnell Mangiaracina CES, ACSM 09/02/2015 2:16 PM

## 2015-09-02 NOTE — Progress Notes (Signed)
  Date: 09/02/2015  Patient name: Marsa ArisCharles Fill  Medical record number: [id number]  Date of birth: 06/12/1935   This patient has been seen and the plan of care was discussed with the house staff. Please see Dr. Bonney RousselBoswell's note for complete details. I concur with his findings.  Inez CatalinaEmily B Mullen, MD 09/02/2015, 2:55 PM

## 2015-09-02 NOTE — Care Management Important Message (Signed)
Important Message  Patient Details  Name: Marsa ArisCharles Eversley MRN: [id number] Date of Birth: 11/28/1935   Medicare Important Message Given:  Yes    Rossi Silvestro, Stephan MinisterSusan Coleman 09/02/2015, 11:10 AM

## 2015-09-02 NOTE — Progress Notes (Signed)
Subjective: No events overnight. Tolerating room air. Walked with cardiac rehab yesterday with no desaturations. Cough improving, no further sputum production. Reports able to lay down in bed without any shortness of breath.    Objective: Vital signs in last 24 hours: Filed Vitals:   09/02/15 0400 09/02/15 0404 09/02/15 0735 09/02/15 0952  BP: 124/64 124/64 120/60   Pulse: 78 82 76   Temp:  98.3 F (36.8 C) 98.5 F (36.9 C)   TempSrc:  Oral Oral   Resp: 23 20 20    Height:      Weight:  173 lb 11.6 oz (78.8 kg)    SpO2: 93% 93% 100% 96%   Weight change: 5 lb 15.2 oz (2.7 kg)  Intake/Output Summary (Last 24 hours) at 09/02/15 1105 Last data filed at 09/02/15 0935  Gross per 24 hour  Intake   1110 ml  Output   1700 ml  Net   -590 ml    General: alert, well-developed, and cooperative to examination.  Lungs: CTAB, no crackles or wheezes Heart: RRR, no murmur, no gallop, and no rub.  Abdomen: soft, non-tender, normal bowel sounds, no distention Pulses: 2+ DP/PT pulses bilaterally Extremities: 2+ pitting edema in left ankle, 1+ in right  Psych: normal mood and affect  Medications: I have reviewed the patient's current medications. Scheduled Meds: . antiseptic oral rinse  7 mL Mouth Rinse BID  . aspirin EC  81 mg Oral Daily  . atorvastatin  80 mg Oral q1800  . carvedilol  18.75 mg Oral BID WC  . clopidogrel  75 mg Oral Q breakfast  . dextromethorphan-guaiFENesin  1 tablet Oral BID  . dorzolamide-timolol  1 drop Left Eye BID  . eplerenone  50 mg Oral Daily  . furosemide  40 mg Oral BID  . ipratropium-albuterol  3 mL Nebulization TID  . isosorbide mononitrate  30 mg Oral Daily  . losartan  50 mg Oral Daily  . potassium chloride  40 mEq Oral Daily  . rivaroxaban  20 mg Oral Q supper  . sodium chloride flush  3 mL Intravenous Q12H  . zolpidem  5 mg Oral Once   Continuous Infusions:   PRN Meds:.sodium chloride, acetaminophen **OR** acetaminophen,  guaiFENesin-dextromethorphan, ipratropium-albuterol, sodium chloride flush, traZODone Assessment/Plan:  CAP: Doing well on room air this morining. No desats with ambulation. Finished 5 day course of Azithromycin and 7 day course of Ceftriaxone. Lungs CTAB.  -Ceftriaxone 2g IV 5/15>>> 5/21 -Azithromycin 500mg  5/15>>>5/19  PAF likely causing some transient CHF CHAD-VASc 3. History of A. Fib s/p ablation. Currently with PAF. Rate controlled with Coreg. Started Xarelto post cath with stent placement.  -Appreciate cardiology help -Continue xarelto  - coreg 18.75 bid per cards.  CAD with NSTEMI: Left heart cath revealed 3 vessel obstructive CAD with predominant branch vessel disease. This includes the first and second diagonal, PLOM and RV marginal branches of the RCA. The LCx lesion appears to be the culprit vessel w/ DES to mid distal LCx. Transfer to telemetry today.  -Appreciate cardiology help -Xarelto due to less bleeding risk with DAP -ASA x 1 month -Plavix x 1 year -Atorvastatin 80 mg qhs -Imdur 30 mg daily -Lasix 40 mg IV bid > 40 mg PO bid today  Hypokalemia: K 3.7 today. Has been receiving lasix during hospitalization.  -KDur 40mEq daily  HFpEF: [facility] appears diastolic dysfunction and LVH without significantly reduced EF. Previous stress studies consistently false positives with negative subsequent  caths so likely NICM. ECHO done yesterday with no LV dysfunction but unable to determine EF. Unable to determine if diastolic dysfunction present. PAP mildly elevated at 49. -ASA  -cardiology consulted, appreciate the consult -coreg 18.75 bid per cards. -lasix as above.   HTN: BP stable 120s systolic. On many agents PTA amlodipine , clonidine 0.1mg  TID, eplerenone , losartan . -Continue losartan 50 mg daily, eplerenone 50 mg daily, coreg 18.75 mg daily -Holding amlodipine and clonidine  -Imdur per above -Lasix per  above  HLD: Stable chronic problem. -Continue Lipitor 80 mg daily  OSA:. qHS CPAP.  FULL CODE  Diet: Heart Healthy  VTE ppx: [place] enoxaparin  Dispo: Anticipate discharge tomororw  The patient does have a current PCP (No primary care provider on file.) and does need an [facility] hospital follow-up appointment after discharge.  The patient does not have transportation limitations that hinder transportation to clinic appointments.  .Services Needed at time of discharge: Y = Yes, Blank = No PT:   OT:   RN:   Equipment:   Other:     LOS: 9 days   Valentino Nose, MD IMTS PGY-1 [phone] 09/02/2015, 11:05 AM

## 2015-09-02 NOTE — Progress Notes (Signed)
Physical Therapy Treatment Patient Details Name: Ivan Mccoy MRN: [id number] DOB: 08/06/1935 Today's Date: 09/02/2015    History of Present Illness Patient is a 80 yo male admitted 08/24/15 with SOB, fever, hypoxia, elevated troponin.  Patient with CAP, PAF, and cardiac cath 5/22 in the afternoon    PMH:  HTN, OSA, CHF, PAF    PT Comments    Patient able to complete part of DGI and demonstrating fall risk for community mobility.  Feel continued skilled PT indicated to initiate HEP for balance and LE strength for home.  Will practice stairs prior to d/c as well.   Follow Up Recommendations  No PT follow up;Supervision for mobility/OOB (outpatient cardiac rehab)     Equipment Recommendations  None recommended by PT    Recommendations for Other Services       Precautions / Restrictions Precautions Precautions: None Restrictions Weight Bearing Restrictions: No    Mobility  Bed Mobility               General bed mobility comments: up in chair  Transfers Overall transfer level: Modified independent               General transfer comment: Increased time  Ambulation/Gait Ambulation/Gait assistance: Supervision;Min guard Ambulation Distance (Feet): 250 Feet Assistive device: None Gait Pattern/deviations: Step-through pattern;Decreased stride length;Drifts right/left     General Gait Details: Ambulated on RA no SOB noted.  Still tends to veer in hallway; unable to practice stairs this session still in step down.    Stairs            Wheelchair Mobility    Modified Rankin (Stroke Patients Only)       Balance                                    Cognition Arousal/Alertness: Awake/alert Behavior During Therapy: WFL for tasks assessed/performed Overall Cognitive Status: Within Functional Limits for tasks assessed                      Exercises      General Comments General comments (skin integrity, edema, etc.): unable to  complete DGI as stairs not accessible in unit.  15 without stairs so still fall risk.  Discussed HEP components to be initiated next session      Pertinent Vitals/Pain Pain Assessment: No/denies pain    Home Living                      Prior Function            PT Goals (current goals can now be found in the care plan section) Progress towards PT goals: Progressing toward goals    Frequency  Min 3X/week    PT Plan Current plan remains appropriate    Co-evaluation             End of Session Equipment Utilized During Treatment: Gait belt Activity Tolerance: Patient tolerated treatment well Patient left: in chair;with family/visitor present;with call bell/phone within reach     Time: 1040-1102 PT Time Calculation (min) (ACUTE ONLY): 22 min  Charges:  $Gait Training: 8-22 mins                    G Codes:      Elray McgregorCynthia Wynn 09/02/2015, 11:28 AM Sheran Lawlessyndi Wynn, PT [phone] 09/02/2015

## 2015-09-03 LAB — BASIC METABOLIC PANEL
Anion gap: 9 (ref 5–15)
BUN: 21 mg/dL — AB (ref 6–20)
CHLORIDE: 105 mmol/L (ref 101–111)
CO2: 25 mmol/L (ref 22–32)
CREATININE: 0.8 mg/dL (ref 0.61–1.24)
Calcium: 9.4 mg/dL (ref 8.9–10.3)
GFR calc Af Amer: >60 mL/min (ref >60)
GFR calc non Af Amer: >60 mL/min (ref >60)
GLUCOSE: 112 mg/dL — AB (ref 65–99)
Potassium: 3.6 mmol/L (ref 3.5–5.1)
Sodium: 139 mmol/L (ref 135–145)

## 2015-09-03 MED ORDER — RIVAROXABAN 20 MG PO TABS: 20.0000 mg | ORAL_TABLET | Freq: Every day | ORAL | Status: DC

## 2015-09-03 MED ORDER — ATORVASTATIN CALCIUM 80 MG PO TABS: 80.0000 mg | ORAL_TABLET | Freq: Every day | ORAL | Status: DC

## 2015-09-03 MED ORDER — CLOPIDOGREL BISULFATE 75 MG PO TABS: 75.0000 mg | ORAL_TABLET | Freq: Every day | ORAL | Status: DC

## 2015-09-03 MED ORDER — POTASSIUM CHLORIDE CRYS ER 20 MEQ PO TBCR: 40.0000 meq | EXTENDED_RELEASE_TABLET | Freq: Every day | ORAL | Status: DC

## 2015-09-03 MED ORDER — ISOSORBIDE MONONITRATE ER 30 MG PO TB24: 30.0000 mg | ORAL_TABLET | Freq: Every day | ORAL | Status: DC

## 2015-09-03 MED ORDER — FUROSEMIDE 40 MG PO TABS: 40.0000 mg | ORAL_TABLET | Freq: Two times a day (BID) | ORAL | Status: DC

## 2015-09-03 NOTE — Progress Notes (Signed)
CARDIAC REHAB PHASE I   Pt states he just walked with PT, declines additional ambulation at this time. Pt states he has no questions regarding education at this time.  Phase 2 [facility] [place]. Pt eagerly awaiting discharge.  Joylene GrapesEmily C Yashas Camilli, RN, BSN 09/03/2015 11:37 AM

## 2015-09-03 NOTE — Progress Notes (Signed)
Subjective: No events overnight. Reports no complaints this morning. LE edema improved. Able to lay down at night without any shortness of breath.    Objective: Vital signs in last 24 hours: Filed Vitals:   09/02/15 1706 09/02/15 2116 09/03/15 0450 09/03/15 0455  BP: 111/53 100/45  131/50  Pulse: 82 78  79  Temp:  99.3 F (37.4 C)  98.6 F (37 C)  TempSrc:  Oral  Oral  Resp:  20  20  Height:      Weight:   166 lb 11.2 oz (75.615 kg)   SpO2:  95%  94%   Weight change: -7 lb 0.4 oz (-3.185 kg)  Intake/Output Summary (Last 24 hours) at 09/03/15 1610 Last data filed at 09/03/15 0200  Gross per 24 hour  Intake    450 ml  Output   1170 ml  Net   -720 ml   General: alert, well-developed, and cooperative to examination.  Lungs: CTAB, no crackles or wheezes Heart: RRR, no murmur, no gallop, and no rub.  Abdomen: soft, non-tender, normal bowel sounds, no distention Pulses: 2+ DP/PT pulses bilaterally Extremities: trace pedal edema Psych: normal mood and affect  Medications: I have reviewed the patient's current medications. Scheduled Meds: . antiseptic oral rinse  7 mL Mouth Rinse BID  . aspirin EC  81 mg Oral Daily  . atorvastatin  80 mg Oral q1800  . carvedilol  18.75 mg Oral BID WC  . clopidogrel  75 mg Oral Q breakfast  . dorzolamide-timolol  1 drop Left Eye BID  . eplerenone  50 mg Oral Daily  . furosemide  40 mg Oral BID  . isosorbide mononitrate  30 mg Oral Daily  . losartan  50 mg Oral Daily  . potassium chloride  40 mEq Oral Daily  . rivaroxaban  20 mg Oral Q supper  . sodium chloride flush  3 mL Intravenous Q12H   Continuous Infusions:   PRN Meds:.sodium chloride, acetaminophen **OR** acetaminophen, ipratropium-albuterol, sodium chloride flush, traZODone Assessment/Plan:  CAP: Doing well on room air this morining. No desats with ambulation. Finished 5 day course of Azithromycin and 7 day course of Ceftriaxone. Lungs CTAB.  -Ceftriaxone 2g IV 5/15>>>  5/21 -Azithromycin  5/15>>>5/19  PAF likely causing some transient CHF CHAD-VASc 3. History of A. Fib s/p ablation. Currently with PAF. Rate controlled with Coreg. Started Xarelto post cath with stent placement.  -Appreciate cardiology help -Continue xarelto  -Coreg 18.75 bid per cards.  CAD with NSTEMI: Left heart cath revealed 3 vessel obstructive CAD with predominant branch vessel disease. This includes the first and second diagonal, PLOM and RV marginal branches of the RCA. The LCx lesion appears to be the culprit vessel w/ DES to mid distal LCx. Transfer to telemetry today.  -Appreciate cardiology help -Xarelto due to less bleeding risk with DAP -ASA x 1 month -Plavix x 1 year -Atorvastatin 80 mg qhs -Imdur 30 mg daily -Lasix 40 mg PO bid   Hypokalemia: K 3.6 today. Has been receiving lasix during hospitalization.  -KDur daily  HFpEF: From review [facility] at [facility] appears diastolic dysfunction and LVH without significantly reduced EF. Previous stress studies consistently false positives with negative subsequent caths so likely NICM. ECHO done yesterday with no LV dysfunction but unable to determine EF. Unable to determine if diastolic dysfunction present. PAP mildly elevated at 49. -ASA  -cardiology consulted, appreciate the consult -coreg 18.75 bid per cards. -lasix as above.   HTN: BP stable  120s systolic. On many agents PTA amlodipine 10mg , clonidine 0.1mg  TID, eplerenone 50mg , losartan 50mg . -Continue losartan 50 mg daily, eplerenone 50 mg daily, coreg 18.75 mg daily -Holding amlodipine and clonidine  -Imdur per above -Lasix per above  HLD: Stable chronic problem. -Continue Lipitor 80 mg daily  OSA:. qHS CPAP.  FULL CODE  Diet: Heart Healthy  VTE ppx: [place] enoxaparin  Dispo: Anticipate discharge this afternoon  The patient does have a current PCP (No primary care provider on file.) and does [facility]  [facility] after discharge.  The patient does not have transportation limitations that hinder transportation to clinic appointments.  .Services Needed at time of discharge: Y = Yes, Blank = No PT:   OT:   RN:   Equipment:   Other:     LOS: 10 days   Ivan Mccoy Amorette Charrette, MD IMTS PGY-1 [phone] 09/03/2015, 8:32 AM

## 2015-09-03 NOTE — Progress Notes (Signed)
Physical Therapy Treatment Patient Details Name: Ivan Mccoy MRN: [id number] DOB: 02/06/1936 Today's Date: 09/03/2015    History of Present Illness Patient is a 80 yo male admitted 08/24/15 with SOB, fever, hypoxia, elevated troponin.  Patient with CAP, PAF, and cardiac cath 5/22 in the afternoon    PMH:  HTN, OSA, CHF, PAF    PT Comments    Pt progressing very well; did not perform stair this session as pt "did not want to miss the doctor" as he is anxious to D/C; discussed initial energy conservation with importance of  incr activity as tol after D/C; cardiac rehab not an option here as pt [place];  Follow Up Recommendations  No PT follow up;Supervision for mobility/OOB ([place] [facility])     Equipment Recommendations  None recommended by PT    Recommendations for Other Services       Precautions / Restrictions Precautions Precautions: None Restrictions Weight Bearing Restrictions: No    Mobility  Bed Mobility               General bed mobility comments: up in chair  Transfers Overall transfer level: Modified independent Equipment used: None             General transfer comment: light use of armrests  Ambulation/Gait Ambulation/Gait assistance: Supervision Ambulation Distance (Feet): 450 Feet Assistive device: None Gait Pattern/deviations: Step-through pattern     General Gait Details: amb on RA, no SOB noted and pt confirms same; O2 sats 93% on RA, HR 93; pt with occaisonal balance checks/hesitation with step but able to self correct without assist   Stairs            Wheelchair Mobility    Modified Rankin (Stroke Patients Only)       Balance           Standing balance support: No upper extremity supported;During functional activity Standing balance-Leahy Scale: Good               High level balance activites: Direction changes;Turns;Head turns High Level  Balance Comments: no LOB with above activities; occaisonal balance check/step hesitation but able to self correct consistently    Cognition Arousal/Alertness: Awake/alert Behavior During Therapy: WFL for tasks assessed/performed Overall Cognitive Status: Within Functional Limits for tasks assessed                      Exercises      General Comments        Pertinent Vitals/Pain Pain Assessment: No/denies pain    Home Living                      Prior Function            PT Goals (current goals can now be found in the care plan section) Acute Rehab PT Goals Patient Stated Goal: home and to grand-dtr's graduation Time For Goal Achievement: 09/05/15 Potential to Achieve Goals: Good Progress towards PT goals: Progressing toward goals    Frequency  Min 3X/week    PT Plan Current plan remains appropriate    Co-evaluation             End of Session   Activity Tolerance: Patient tolerated treatment well Patient left: in chair;with call bell/phone within reach     Time: 0927-0938 PT Time Calculation (min) (ACUTE ONLY): 11 min  Charges:  $Gait Training:  8-22 mins                    G Codes:      Ivan Mccoy 26-Sep-2015, 10:34 AM

## 2015-09-03 NOTE — Plan of Care (Signed)
Problem: Pain Managment: Goal: General experience of comfort will improve Outcome: Completed/Met Date Met:  09/03/15 Pt educated on pain scale and available interventions. Pt verbalized understanding.

## 2015-09-03 NOTE — Progress Notes (Signed)
Subjective: Ivan Mccoy was seen and examined during morning rounds.  He has no complaints at this time - denies CP, palpitations, SOB, wheezing, nausea, vomiting, abdominal pain.  He did well without oxygen yesterday and was taken on a walk by cardiac PT.  He is looking forward to discharge today  Objective: Vital signs in last 24 hours: Filed Vitals:   09/02/15 1706 09/02/15 2116 09/03/15 0450 09/03/15 0455  BP: 111/53 100/45  131/50  Pulse: 82 78  79  Temp:  99.3 F (37.4 C)  98.6 F (37 C)  TempSrc:  Oral  Oral  Resp:  20  20  Height:      Weight:   75.615 kg (166 lb 11.2 oz)   SpO2:  95%  94%   Weight change: -3.185 kg (-7 lb 0.4 oz)  Intake/Output Summary (Last 24 hours) at 09/03/15 4696 Last data filed at 09/03/15 0200  Gross per 24 hour  Intake    450 ml  Output   1170 ml  Net   -720 ml   General: Well-appearing, conversant. Respiratory: Clear, no wheezing.  Heart: RRR. No murmurs Extremities: Trace pedal edema bilaterally.  2+ radial pulses      Lab Results: BMP Latest Ref Rng 09/03/2015 09/02/2015 09/01/2015  Glucose 65 - 99 mg/dL 295(M) 841(L) 244(W)  BUN 6 - 20 mg/dL 10(U) 72(Z) 36(U)  Creatinine 0.61 - 1.24 mg/dL 4.40 3.47 4.25  Sodium 135 - 145 mmol/L 139 137 136  Potassium 3.5 - 5.1 mmol/L 3.6 3.7 3.7  Chloride 101 - 111 mmol/L 105 103 101  CO2 22 - 32 mmol/L Calcium 8.9 - 10.3 mg/dL 9.4 9.3 9.1   Micro Results: Recent Results (from the past 240 hour(s))  Blood Culture (routine x 2)     Status: None   Collection Time: 08/24/15  7:22 PM  Result Value Ref Range Status   Specimen Description BLOOD LEFT HAND  Final   Special Requests IN PEDIATRIC BOTTLE 3CC  Final   Culture NO GROWTH 5 DAYS  Final   Report Status 08/29/2015 FINAL  Final  Blood Culture (routine x 2)     Status: None   Collection Time: 08/24/15  8:11 PM  Result Value Ref Range Status   Specimen Description BLOOD LEFT ANTECUBITAL  Final   Special Requests BOTTLES DRAWN  AEROBIC AND ANAEROBIC 5CC  Final   Culture NO GROWTH 5 DAYS  Final   Report Status 08/29/2015 FINAL  Final  Urine culture     Status: None   Collection Time: 08/24/15 11:27 PM  Result Value Ref Range Status   Specimen Description URINE, RANDOM  Final   Special Requests NONE  Final   Culture NO GROWTH  Final   Report Status 08/26/2015 FINAL  Final  MRSA PCR Screening     Status: None   Collection Time: 08/24/15 11:28 PM  Result Value Ref Range Status   MRSA by PCR NEGATIVE NEGATIVE Final    Comment:        The GeneXpert MRSA Assay (FDA approved for NASAL specimens only), is one component of a comprehensive MRSA colonization surveillance program. It is not intended to diagnose MRSA infection nor to guide or monitor treatment for MRSA infections.   Culture, sputum-assessment     Status: None   Collection Time: 08/25/15  2:50 PM  Result Value Ref Range Status   Specimen Description SPUTUM  Final   Special Requests Normal  Final   Sputum evaluation  Final    THIS SPECIMEN IS ACCEPTABLE. RESPIRATORY CULTURE REPORT TO FOLLOW.   Report Status 08/25/2015 FINAL  Final  Culture, respiratory (NON-Expectorated)     Status: None   Collection Time: 08/25/15  3:09 PM  Result Value Ref Range Status   Specimen Description SPUTUM  Final   Special Requests NONE  Final   Gram Stain   Final    MODERATE WBC PRESENT,BOTH PMN AND MONONUCLEAR FEW SQUAMOUS EPITHELIAL CELLS PRESENT MODERATE GRAM POSITIVE COCCI IN PAIRS MODERATE GRAM NEGATIVE RODS THIS SPECIMEN IS ACCEPTABLE FOR SPUTUM CULTURE Performed at [facility]    Culture   Final    NORMAL OROPHARYNGEAL FLORA Performed at [facility]    Report Status 08/28/2015 FINAL  Final   Studies/Results: No results found. Medications: I have reviewed the patient's current medications. Scheduled Meds: . antiseptic oral rinse  7 mL Mouth Rinse BID  . aspirin EC  81 mg Oral Daily  . atorvastatin  80 mg Oral q1800  .  carvedilol  18.75 mg Oral BID WC  . clopidogrel  75 mg Oral Q breakfast  . dorzolamide-timolol  1 drop Left Eye BID  . eplerenone  50 mg Oral Daily  . furosemide  40 mg Oral BID  . isosorbide mononitrate  30 mg Oral Daily  . losartan  50 mg Oral Daily  . potassium chloride  40 mEq Oral Daily  . rivaroxaban  20 mg Oral Q supper  . sodium chloride flush  3 mL Intravenous Q12H   Continuous Infusions:  PRN Meds:.sodium chloride, acetaminophen **OR** acetaminophen, ipratropium-albuterol, sodium chloride flush, traZODone Assessment/Plan: Strep pneumo PNA:  Continues to do well off of oxygen.  Satting at 97% ORA.  LE edema improving.  Currently on Lasix 40mg  bid oral.  Will discharge on Lasix 40mg  bid oral.  -Discharge today, send out on Lasix 40mg  bid and K supplement  -Follow-up CXR in 4-6 weeks with PCP Dr. Lum BabePieper in [place]   CAD: Denies any chest pain at this time, has been in NSR. Plan to continue Xarelto, Plavix, ASA, nitrate, eplerenone, Coreg, Losartan, Lipitor. He can follow up with Dr. Melvyn NethLewis in [place] as outpatient.   -Lipitor 80 mg daily  -Xarelto 20mg  daily  -Imdur nitrate 30mg  tablet daily -Plavix 75 mg daily  -ASA 81 mg daily  -Eplerenone 25 mg daily  -Coreg 18.75 mg bid  -Losartan 25 mg daily   -Lasix 40 mg bid oral  Hypertension: BP well-controlled in 110s-130s. Continue meds as above at discharge.  -Eplerenone 25 mg daily  -Coreg 18.75 mg bid  -Losartan 25 mg daily  -Lasix 40 mg bid oral -Schedule follow-up appointment with cardiologist Dr. Melvyn NethLewis within the next month   This is a Medical Student Note.  The care of the patient was discussed with Dr. Valentino NoseNathan Boswell and the assessment and plan formulated with their assistance.  Please see their attached note for official documentation of the daily encounter.   LOS: 10 days   Nathaneil Canaryolleen M Tameah Mihalko, Med Student 09/03/2015, 8:28 AM

## 2015-09-03 NOTE — Progress Notes (Signed)
  Date: 09/03/2015  Patient name: Ivan Mccoy  Medical record number: [id number]  Date of birth: 03/18/1936   This patient has been seen and the plan of care was discussed with the house staff. Please see Dr. Bonney RousselBoswell's note for complete details. I concur with his findings.  [name] is much improved, no longer requiring oxygen, edema is improved to baseline.  We discussed his new medication regimen with him this morning.  He is ready for discharge.   Inez CatalinaEmily B Mullen, MD 09/03/2015, 2:28 PM

## 2015-09-03 NOTE — Progress Notes (Signed)
Hospital Problem List     Active Problems:   CAP (community acquired pneumonia)   Community acquired pneumonia   Hypoxia   Paroxysmal atrial fibrillation (HCC)   NSTEMI (non-ST elevated myocardial infarction) (HCC)   Stented coronary artery   Acute combined systolic and diastolic CHF, NYHA class 3 (HCC)    Patient Profile:   Primary Cardiologist: Dr. Paralee [facility]  80 yo male w/ PMH of PAF who presented to [facility] ED on 08/26/15 with SOB, cough and subjective fever and chills. Being treated for CAP. Troponin peaked at 1.15. Cath on 5/22 showing 3V disease with 99% stenosis of Prox -Mid Cx which was treated with a DES.  Subjective   Denies any chest pain or palpitations overnight.  Inpatient Medications    . antiseptic oral rinse  7 mL Mouth Rinse BID  . aspirin EC  81 mg Oral Daily  . atorvastatin  80 mg Oral q1800  . carvedilol  18.75 mg Oral BID WC  . clopidogrel  75 mg Oral Q breakfast  . dorzolamide-timolol  1 drop Left Eye BID  . eplerenone  50 mg Oral Daily  . furosemide  40 mg Oral BID  . isosorbide mononitrate  30 mg Oral Daily  . losartan  50 mg Oral Daily  . potassium chloride  40 mEq Oral Daily  . rivaroxaban  20 mg Oral Q supper  . sodium chloride flush  3 mL Intravenous Q12H    Vital Signs    Filed Vitals:   09/02/15 1706 09/02/15 2116 09/03/15 0450 09/03/15 0455  BP: 111/53 100/45  131/50  Pulse: 82 78  79  Temp:  99.3 F (37.4 C)  98.6 F (37 C)  TempSrc:  Oral  Oral  Resp:  20  20  Height:      Weight:   166 lb 11.2 oz (75.615 kg)   SpO2:  95%  94%    Intake/Output Summary (Last 24 hours) at 09/03/15 1039 Last data filed at 09/03/15 0900  Gross per 24 hour  Intake    680 ml  Output   1170 ml  Net   -490 ml   Filed Weights   09/01/15 0600 09/02/15 0404 09/03/15 0450  Weight: 167 lb 12.3 oz (76.1 kg) 173 lb 11.6 oz (78.8 kg) 166 lb 11.2 oz (75.615 kg)    Physical Exam    General: Well developed, well  nourished, male appearing in no acute distress. Head: Normocephalic, atraumatic.  Neck: Supple without bruits, JVD not elevated. Lungs:  Resp regular and unlabored, CTA without wheezing or rales. Heart: RRR, S1, S2, no S3, S4, or murmur; no rub. Abdomen: Soft, non-tender, non-distended with normoactive bowel sounds. No hepatomegaly. No rebound/guarding. No obvious abdominal masses. Extremities: No clubbing, cyanosis, or edema. Distal pedal pulses are 2+ bilaterally. Neuro: Alert and oriented X 3. Moves all extremities spontaneously. Psych: Normal affect.  Labs    CBC  Recent Labs  09/01/15 0227 09/02/15 0534  WBC 8.9 9.9  HGB 10.9* 11.3*  HCT 32.9* 34.0*  MCV 92.2 91.9  PLT 468* 482*   Basic Metabolic Panel  Recent Labs  09/02/15 0534 09/03/15 0433  NA 137 139  K 3.7 3.6  CL 103 105  CO2 23 25  GLUCOSE 125* 112*  BUN 25* 21*  CREATININE 0.98 0.80  CALCIUM 9.3 9.4    Telemetry    Not connected to telemetry.  ECG    No new tracings.  Cardiac Studies and Radiology    X-ray Chest Pa And Lateral: 08/25/2015  CLINICAL DATA:  Shortness of breath since yesterday. Community acquired pneumonia. EXAM: CHEST  2 VIEW COMPARISON:  08/24/2015 FINDINGS: Allowing for lower lung volumes on the current exam, bilateral, centrally predominant, airspace opacities are similar to the previous day's exam. There are small pleural effusions. No pneumothorax. Cardiac silhouette is normal in size. IMPRESSION: 1. Bilateral airspace opacities with small pleural effusions similar to the previous day's exam. Findings are consistent with either bilateral pneumonia or pulmonary edema. Electronically Signed   By: Amie Portland M.D.   On: 08/25/2015 08:07   Dg Chest Port 1 View: 08/24/2015  CLINICAL DATA:  Shortness of breath, hypoxia and fever. EXAM: PORTABLE CHEST 1 VIEW COMPARISON:  None. FINDINGS: The heart size and mediastinal contours are within normal limits. Dense infiltrates are seen in the  right upper lobe and right lower lobe. There also is suspected to be a component of infiltrate in the left perihilar lung. Findings are consistent with bilateral pneumonia. There also may be mild superimposed interstitial edema without overt airspace edema or visible pleural effusions. No pneumothorax. The visualized skeletal structures are unremarkable. IMPRESSION: Evidence of bilateral pneumonia, right greater than left, and potentially superimposed mild interstitial edema. Electronically Signed   By: Irish Lack M.D.   On: 08/24/2015 19:58    Cardiac Catheterization: 08/31/2015    Dist RCA lesion, 100% stenosed.  RPDA lesion, 40% stenosed.  Acute Mrg lesion, 99% stenosed.  Ost 1st Diag lesion, 80% stenosed.  Ost 2nd Diag to 2nd Diag lesion, 90% stenosed.  There is mild left ventricular systolic dysfunction.  Prox Cx to Mid Cx lesion, 99% stenosed. Post intervention, there is a 0% residual stenosis.  1. 3 vessel obstructive CAD with predominant branch vessel disease. This includes the first and second diagonal, PLOM and RV marginal branches of the RCA. The LCx lesion appears to be the culprit vessel 2. Mild LV dysfunction with elevated LV EDP 3. Successful stenting of the mid-distal LCx with DES.   Plan: DAPT with ASA and Plavix for one month then discontinue ASA. Continue Plavix for at least one year. Plan to resume Pradaxa tomorrow. Patient needs additional IV diuresis. Continue beta blocker and add oral nitrate.   Assessment & Plan    1. Paroxysmal atrial fibrillation - This patients CHA2DS2-VASc Score and unadjusted Ischemic Stroke Rate (% per year) is equal to 4.8 % stroke rate/year from a score of 4 (HTN, CAD, Age (2)). Switched to [employer].  - continue BB for rate control.  2. NSTEMI -  troponin elevated up to 1.15, left heart cath revealed 3 vessel obstructive CAD with predominant branch vessel disease. This includes the first and second diagonal, PLOM and RV marginal  branches of the RCA. The LCx lesion appears to be the culprit vessel w/ DES to mid distal LCx.  - Dr. [place] spoke with his Primary Cardiologist in [place] and prefers for him to be on Xarelto and Plavix at time of discharge. Will d/c ASA.   - continue statin and BB.   3. HTN - BP has been 100/45 - 141/71 in the past 24 hours. - continue current medication regimen.  4. Community acquired pneumonia - Management per primary team.   5. Pulmonary edema - would continue PO Lasix  BID at time of discharge.  6. HLD - continue statin therapy.  Will arrange follow-up with his Primary Cardiologist in [place]. This is scheduled for Wednesday June 14  at 3:30 pm.    Signed, Ellsworth LennoxBrittany M Strader , PA-C 10:39 AM 09/03/2015 Pager: [phone]  Patient seen and examined and history reviewed. Agree with above findings and plan. [name] looks great. No chest pain or dyspnea. Lungs are clear. No edema. He is stable for DC today from cardiac standpoint. I spoke with his primary Cardiologist in [place] and arranged follow up there. Copies of Echo and cardiac cath given to patient.   Peter SwazilandJordan, MDFACC 09/03/2015 11:13 AM

## 2015-09-03 NOTE — Discharge Summary (Signed)
Name: Ivan Mccoy MRN: [id number] DOB: [birth year] 80 y.o. PCP: No primary care provider on file.  Date of Admission: 08/24/2015  7:05 PM Date of Discharge: 09/03/2015 Attending Physician: Debe Coder, MD  Discharge Diagnosis: Active Problems:   CAP (community acquired pneumonia)   Community acquired pneumonia   Hypoxia   Paroxysmal atrial fibrillation (HCC)   NSTEMI (non-ST elevated myocardial infarction) (HCC)   Stented coronary artery   Acute combined systolic and diastolic CHF, NYHA class 3 (HCC)  Discharge Medications:   Medication List    STOP taking these medications        amLODipine 10 MG tablet  Commonly known as:  NORVASC     cloNIDine 0.1 MG tablet  Commonly known as:  CATAPRES     pravastatin 40 MG tablet  Commonly known as:  PRAVACHOL      TAKE these medications        aspirin EC 81 MG tablet  Take 81 mg by mouth at bedtime.     atorvastatin 80 MG tablet  Commonly known as:  LIPITOR  Take 1 tablet (80 mg total) by mouth daily at 6 PM.     carvedilol 12.5 MG tablet  Commonly known as:  COREG  Take 12.5 mg by mouth 2 (two) times daily with a meal.     CENTRUM SILVER ADULT 50+ PO  Take 1 tablet by mouth daily with breakfast.     PRESERVISION AREDS 2 Caps  Take 1 capsule by mouth 2 (two) times daily.     clopidogrel 75 MG tablet  Commonly known as:  PLAVIX  Take 1 tablet (75 mg total) by mouth daily with breakfast.     Co Q 10 100 MG Caps  Take 1 capsule by mouth daily.     dorzolamide-timolol 22.3-6.8 MG/ML ophthalmic solution  Commonly known as:  COSOPT  Instill 1 drop into the left eye two times daily     EPIPEN 2-PAK 0.3 mg/0.3 mL Soaj injection  Generic drug:  EPINEPHrine  Inject 0.3 mg into the muscle once. AS NEEDED FOR ANAPHYLAXIS     eplerenone 50 MG tablet  Commonly known as:  INSPRA  Take 50 mg by mouth daily.     fenofibrate 160 MG tablet  Take 160 mg by mouth daily.     furosemide 40 MG tablet  Commonly known as:   LASIX  Take 1 tablet (40 mg total) by mouth 2 (two) times daily.     GLUCOSAMINE HCL PO  Take 1 capsule by mouth 3 (three) times daily.     isosorbide mononitrate 30 MG 24 hr tablet  Commonly known as:  IMDUR  Take 1 tablet (30 mg total) by mouth daily.     losartan 50 MG tablet  Commonly known as:  COZAAR  Take 50 mg by mouth 2 (two) times daily.     magnesium oxide 400 MG tablet  Commonly known as:  MAG-OX  Take 400 mg by mouth daily.     METAMUCIL FIBER PO  Take 1 capsule by mouth every morning.     metroNIDAZOLE 0.75 % cream  Commonly known as:  METROCREAM  Apply 1 application topically every morning. APPLY TO FACE     OMEGA-3 FISH OIL PO  Take 1 capsule by mouth 2 (two) times daily.     potassium chloride SA 20 MEQ tablet  Commonly known as:  K-DUR,KLOR-CON  Take 2 tablets (40 mEq total) by mouth daily.     PROBIOTIC  DAILY Caps  Take 1 capsule by mouth at bedtime.     rivaroxaban 20 MG Tabs tablet  Commonly known as:  XARELTO  Take 1 tablet (20 mg total) by mouth daily with supper.     saw palmetto 160 MG capsule  Take 160 mg by mouth daily.        Disposition and follow-up:   Ivan Mccoy was discharged from [facility] in Stable condition.  At the hospital follow up visit please address:  CAP: Treated with 5 days of azithro and 7 days of ceftriaxone. Follow up CXR in 4-6 weeks. CAD: S/P cath with LCX DES stent placement. Started ASA/Plavix. ASA 1 month. Plavix 1 year. D/C pravastatin, started Lipitor. Coreg to 18.75 mg bid. HFpEF: Volume overloaded. Required Lasix 40 mg bid. Continued at discharge. Started K supplement. BMET at follow up.  PAF: Xarelto started. HTN: Started Imdur. D/C amlodipine and clonidine. Increased coreg to 18.75 bid. Continued eplerenone.   2.  Labs / imaging needed at time of follow-up: BMET, CXR 4-6 weeks  3.  Pending labs/ test needing follow-up: None  Follow-up Appointments: Follow-up Information     Follow up with Blane Ohara On 09/23/2015.   Specialty:  Cardiology   Why:  Cardiology Hospital Follow-up at Dr. Melvyn Neth' Office on 09/23/2015 at 3:30PM. If needing to be seen sooner, please call the office.   Contact information:   [address] [place] [address] [phone]       Follow up with Dr. Jeanelle Malling On 09/17/2015.   Why:  11 AM      Discharge Instructions: Discharge Instructions    Amb Referral to Cardiac Rehabilitation    Complete by:  As directed   Diagnosis:   PTCA Comment - to [facility] Coronary Stents NSTEMI       Diet - low sodium heart healthy    Complete by:  As directed      Increase activity slowly    Complete by:  As directed            Consultations: Treatment Team:  Rounding Lbcardiology, MD  Procedures Performed:  X-ray Chest Pa And Lateral  08/25/2015  CLINICAL DATA:  Shortness of breath since yesterday. Community acquired pneumonia. EXAM: CHEST  2 VIEW COMPARISON:  08/24/2015 FINDINGS: Allowing for lower lung volumes on the current exam, bilateral, centrally predominant, airspace opacities are similar to the previous day's exam. There are small pleural effusions. No pneumothorax. Cardiac silhouette is normal in size. IMPRESSION: 1. Bilateral airspace opacities with small pleural effusions similar to the previous day's exam. Findings are consistent with either bilateral pneumonia or pulmonary edema. Electronically Signed   By: Amie Portland M.D.   On: 08/25/2015 08:07   Dg Chest Port 1 View  08/24/2015  CLINICAL DATA:  Shortness of breath, hypoxia and fever. EXAM: PORTABLE CHEST 1 VIEW COMPARISON:  None. FINDINGS: The heart size and mediastinal contours are within normal limits. Dense infiltrates are seen in the right upper lobe and right lower lobe. There also is suspected to be a component of infiltrate in the left perihilar lung. Findings are consistent with bilateral pneumonia. There also may be mild superimposed interstitial edema without overt  airspace edema or visible pleural effusions. No pneumothorax. The visualized skeletal structures are unremarkable. IMPRESSION: Evidence of bilateral pneumonia, right greater than left, and potentially superimposed mild interstitial edema. Electronically Signed   By: Irish Lack M.D.   On: 08/24/2015 19:58   2D Echo:  Study Conclusions  -  Procedure narrative: Transthoracic echocardiography. Image  quality was adequate. The study was technically difficult. - Left ventricle: The cavity size was normal. There was mild focal  basal hypertrophy of the septum. Systolic function was normal.  Wall motion was normal; there were no regional wall motion  abnormalities. The study is not technically sufficient to allow  evaluation of LV diastolic function. - Aortic valve: Transvalvular velocity was within the normal range.  There was no stenosis. There was mild regurgitation. - Mitral valve: Calcified annulus. There was mild regurgitation. - Left atrium: The atrium was moderately dilated. - Right ventricle: The cavity size was normal. Wall thickness was  normal. Systolic function was normal. - Tricuspid valve: There was mild regurgitation. - Pulmonary arteries: Systolic pressure was moderately increased.  PA peak pressure: 49 mm Hg (S). - Inferior vena cava: The vessel was normal in size. The  respirophasic diameter changes were blunted (< 50%), consistent  with normal central venous pressure.  Cardiac Cath:   Dist RCA lesion, 100% stenosed.  RPDA lesion, 40% stenosed.  Acute Mrg lesion, 99% stenosed.  Ost 1st Diag lesion, 80% stenosed.  Ost 2nd Diag to 2nd Diag lesion, 90% stenosed.  There is mild left ventricular systolic dysfunction.  Prox Cx to Mid Cx lesion, 99% stenosed. Post intervention, there is a 0% residual stenosis.  1. 3 vessel obstructive CAD with predominant branch vessel disease. This includes the first and second diagonal, PLOM and RV marginal branches of  the RCA. The LCx lesion appears to be the culprit vessel 2. Mild LV dysfunction with elevated LV EDP 3. Successful stenting of the mid-distal LCx with DES.   Plan: DAPT with ASA and Plavix for one month then discontinue ASA. Continue Plavix for at least one year. Plan to resume Pradaxa tomorrow. Patient needs additional IV diuresis. Continue beta blocker and add oral nitrate  Admission HPI: 80 y/o man with a history of HTN, paroxysmal Afib s/p cryoablation 2013, OSA presents to the ED after 3 days of worsening shortness of breath. This started Saturday with nausea and chills as the most prominent symptoms. This worsened and he has not tolerated eating or drinking much Sunday or today but denies any vomiting. His wife reports checking his temperature at home at 101.69F. He has also had a worsening nonproductive cough and today is persistently short of breath. He denies associated chest pain. He does not have other known sick contacts and has minimal upper airway symptoms. On arrival to the ED he was hypoxic that partially improved 90%s with O2 6L by [place], CXR was obtained showing bilateral R>L infiltrates and labs consistent with leukocytosis. IVNS, CTX, and azithromycin started.  [place] [facility] but was travelling for grandchildren's graduation. He was previously feeling at baseline health and saw his Cardiologist a month ago with no changes in treatment. He does not have any history of COPD or other lung disease that he is aware of. He has previously undergone echocardiography showing LVH and diastolic dysfunction. Previous stress tests done years ago were falsely positive twice with negative subsequent heart catheterizations.  Hospital Course by problem list:   Community acquired pneumonia CXR showed bilateral pneumonia, right greater than left, as well as pulmonary edema. He completed a 7 day course of ceftriaxone  and a 5 day course of azithromycin while in the hospital.  He required up to 10L of oxygen during his hospitalization but was  slowly weaned off oxygen and was tolerating room air. By time of discharge, patient was tolerating long walks without supplemental oxygen and he was cleared by PT to return home without restrictions.  He should follow up with his PCP in 4-6 weeks for repeat CXR.   Fluid overload may also have contributed to patient's shortness of breath. He received fluids in the ED for possible sepsis, leading to fluid overload.  Discharged on lasix 40 mg bid with K daily.    CAD/HFpEF Patient has a history of a-fib, s/p ablation and is not on chronic anticoagulation, CHADS-VASC of 3.  A month prior to admission he had a yearly checkup with his cardiologist, Dr. Melvyn Neth, which was unremarkable.    During admission to ED, patient's troponin was 1 and trended up to 1.5.  Initial EKG showed incomplete left bundle branch block.  Serial EKGs ishowed non-specific S and T wave abnormalities as well as PACs.  Patient says he recalls his chest feeling "funny" a week prior to admission and EKG from that time showed T wave inversion in leads V5, V6.  Cardiology was consulted given patient's elevated troponins and history of CP one week prior. Patient went into a-fib and had runs of PACs and PVCs on telemetry.  An ECHO on 5/17 showed no LV dysfunction but could not evaluate ejection fraction.  ECHO also showed moderate elevated pulmonary artery pressure at 49.   Coronary catheterization showed 99% stenosis of left circumflex artery, which was stented with a drug eluting stent.  Patient also had 80% stenosis of first diagonal branch and 90% stenosis of second diagonal branch of left anterior descending artery.  The right coronary artery was 100% stenosed but had developed sufficient collateral blood flow.  Cath also showed mild left ventricle systolic dysfunction.  Results from cath suggested patient had an NSTEMI with  occlusion of left circumflex artery sometime in the prior two weeks.  Following diagnosis of recent NSTEMI and need for drug-eluting stent, cardiology started patient on Plavix 75 mg, to be continued for one year, and aspirin 81 mg, to be continued for one month.  He was started on Imdur nitrate 30mg  daily and Lipitor in place of pravastatin. He was also started on Xarelto. He will need to follow up with his cardiologist, Dr. Melvyn Neth, this month for long-term management.  He must stay on Plavix for at least 1 year and aspirin for 1 month.  Further changes to medications can be managed by Dr. Melvyn Neth as an outpatient.       Hypokalemia Patient became hypokalemic over course of hospitalization and potassium was repleted with K-Dur PRN.  Hypokalemia likely in the setting of Lasix.  Patient will be discharged home on Lasix 40mg  bid as well as a potassium supplement to help prevent hypokalemia.   Hypertension  Blood pressure was well-controlled over hospitalization. Coreg 12.5 mg was continued during admission, and up-titrated to 18.75mg  bid. He will be discharged on Coreg 18.75 mg bid, Lasix 40 mg bid, Losartan 25 mg daily, and Eplerenone 25 mg daily.  Discontinued amlodipine and clonidine. He should follow up with his cardiologist and PCP for further blood pressure management.  Hyperlipidemia  Prior to admission, patient had been on pravastatin 40 mg and fenofibrate 160mg  for hyperlipidemia.  Following findings from coronary cath and likely NSTEMI, patient was transitioned off of pravastatin and onto Lipitor 80mg .   OSA  Patient required supplemental oxygen most nights during hospitalization.  He refused to wear his CPAP on  multiple nights. No new needs or issues in regards to OSA during hospitalization.    Discharge Vitals:   BP 131/50 mmHg  Pulse 79  Temp(Src) 98.6 F (37 C) (Oral)  Resp 20  Ht  (1.778 m)  Wt 166 lb 11.2 oz (75.615 kg)  BMI 23.92 kg/m2  SpO2 94%  Discharge Labs:  No  results found for this or any previous visit (from the past 24 hour(s)).  Signed: Valentino Nose, MD 09/05/2015, 9:08 PM

## 2015-09-25 ENCOUNTER — Telehealth: Payer: Self-pay | Admitting: Cardiology

## 2015-09-25 NOTE — Telephone Encounter (Signed)
[facility] [name] [facility].She will fax cardiac rehab referral for Dr.Jordan to sign.Ok to fax back on 10/05/15.Pt will start cardiac rehab 10/06/15.

## 2015-09-25 NOTE — Telephone Encounter (Signed)
New message     The pt is suppose to be starting cardiac rehab at [facility] she needs to Md to fax over the referral and notes on the pt's procedure please fax to [phone].

## 2015-10-05 ENCOUNTER — Telehealth: Payer: Self-pay | Admitting: Cardiology

## 2015-10-05 ENCOUNTER — Encounter: Payer: Self-pay | Admitting: *Deleted

## 2015-10-05 NOTE — Telephone Encounter (Signed)
Please call her,concerning pt,Ivan Mccoy.

## 2015-10-05 NOTE — Telephone Encounter (Signed)
Cardiac Rehab order signed by Dr.Jordan and fax[facility] to [facility] at fax # [phone].

## 2015-10-05 NOTE — Telephone Encounter (Signed)
F/u Message ° °Pt returning RN call, please call back to discuss °

## 2015-10-05 NOTE — Telephone Encounter (Signed)
Spoke with lisa in rehab, they need an order for pt to participate. Will generate letter and fax to [phone]..[name]

## 2015-10-08 NOTE — Telephone Encounter (Signed)
Cardiac rehab [facility].

## 2015-12-21 HISTORY — PX: MITRAL VALVE REPLACEMENT: SHX147

## 2019-01-31 ENCOUNTER — Ambulatory Visit: Payer: Medicare Other | Admitting: Cardiology

## 2019-02-07 NOTE — Progress Notes (Signed)
Cardiology Office Note   Date:  02/11/2019   ID:  Ivan ArisCharles Cherian, DOB 07/29/1935, MRN [id number]  PCP:  Merri BrunettePharr, Walter, MD  Cardiologist:   Brodee Mauritz [place], MD   Chief Complaint  Patient presents with  . Atrial Fibrillation  . Coronary Artery Disease  . Mitral Regurgitation      History of Present Illness: Ivan Mccoy is a 83 y.o. male who is seen at the request of Dr  Melvyn NethLewis for evaluation of CAD and paroxysmal Afib. He was last seen by me in May 2017. He was followed by Dr. Frederico HammanLance Lewis of [facility]. [place]. He has a history of atrial fibrillation status post ablation in September 2013. He is not on anticoagulation therapy. He was on Pradaxa in the past. He also has a history of sleep apnea and is compliant with CPAP therapy. Additional past medical history is significant for hypertension and hyperlipidemia. He had a Cardiolite exercise tolerance test 05/06/2011 which was negative for ischemia. Echocardiogram also in January 2013 showed normal left ventricular systolic function with an estimated ejection fraction of 50-55%. There was moderate left ventricular hypertrophy with diastolic dysfunction. The left atrium and right atrium were mildly enlarged. Moderate tricuspid regurgitation and mild pulmonary hypertension was noted at that time.  In May 2017 he presented here with complaints of dyspnea, cough, subjective fever and chills. His CXR showed evidence of bilateral pneumonia and potentially superimposed mild interstitial edema. BNP was abnormal at 1070. Troponin was elevated at 1.15. Ecg showed T wave inversions in V5-6. He was treated for CAP. He was noted to have some episodes of Afib. Echo showed normal EF with mild basal septal hypertrophy. Mild MR and TR. Mild pulmonary HTN. He did undergo left heart cath which revealed 3 vessel obstructive CAD with predominant branch vessel disease. This includes the first and second diagonal, PLOM  and RV marginal branches of the RCA. The LCx lesion appeared to be the culprit vessel and was treated w/ DES to mid distal LCx. He was DC on Plavix and Xarelto.   On subsequent follow up with Dr Melvyn NethLewis he was noted to have a more significant murmur and Echo showed anterior MV prolapse with moderate MR. TEE showed severe prolapse of the A2 scallop of the anterior leaflet with severe MR. He also had severe pulmonary HTN. Event monitor in 2017 showed NSR with PACs. No Afib. [facility] and [facility] by Dr Silvestre MesiGlower. On 12/21/15 he had attempt at MV repair but ultimately required MV replacement with a #27 Carentier Edwards pericardial valve. Most recent Echo on November 03, 2017 showed EF 50% with moderate LVH. Moderate LAE. Normally functioning MV prosthesis. Mean gradient 3 mm Hg. Trivial MR. Mild AI and TR.   The patient reports that since his surgery he has done very well. Denies any chest pain, palpitations, dizziness, dyspnea, orthopnea, PND, or edema. He is still active. No bleeding. Reports BP at home consistently 130-135/75. States cholesterol is less than 100. Labs recently done by primary care.     Past Medical History:  Diagnosis Date  . Arrhythmia    paroxysmal afib  . CHF (congestive heart failure) (HCC)   . Coronary artery disease   . Heart murmur   . Hyperlipidemia   . Hypertension   . Macular degeneration   . Obstructive sleep apnea     Past Surgical History:  Procedure Laterality Date  . CARDIAC CATHETERIZATION N/A 08/31/2015  Procedure: Left Heart Cath and Coronary Angiography;  Surgeon: Nakeshia Waldeck M [place], MD;  Location: [facility] INVASIVE CV LAB;  Service: Cardiovascular;  Laterality: N/A;  . CARDIAC CATHETERIZATION N/A 08/31/2015   Procedure: Coronary Stent Intervention;  Surgeon: Derrick Orris M [place], MD;  Location: [facility] INVASIVE CV LAB;  Service: Cardiovascular;  Laterality: N/A;  . 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 MITRAL VALVE REPLACEMENT  12/21/2015   #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   . TONSILLECTOMY AND ADENOIDECTOMY       Current Outpatient Medications  Medication Sig Dispense Refill  . amLODipine (NORVASC) 5 MG tablet Take 1 tablet by mouth daily.    . carvedilol (COREG) 12.5 MG tablet Take 12.5 mg by mouth 2 (two) times daily with a meal.    . Cholecalciferol (VITAMIN D3) 25 MCG (1000 UT) CAPS Take 1 tablet by mouth 3 (three) times daily.    . dorzolamide-timolol (COSOPT) 22.3-6.8 MG/ML ophthalmic solution Instill 1 drop into the left eye two times daily  0  . EPINEPHrine (EPIPEN 2-PAK) 0.3 mg/0.3 mL IJ SOAJ injection Inject 0.3 mg into the muscle once. AS NEEDED FOR ANAPHYLAXIS    . eplerenone (INSPRA) 50 MG tablet Take 50 mg by mouth daily.    . fenofibrate 160 MG tablet Take 160 mg by mouth daily.    . furosemide (LASIX) 40 MG tablet Take 0.5 tablets (20 mg total) by mouth daily. 60 tablet 0  . GLUCOSAMINE HCL PO Take 1 capsule by mouth 3 (three) times daily.    . isosorbide mononitrate (IMDUR) 30 MG 24 hr tablet Take 1 tablet (30 mg total) by mouth daily. 30 tablet 0  . losartan (COZAAR) 50 MG tablet Take 50 mg by mouth 2 (two) times daily.    . metroNIDAZOLE (METROCREAM) 0.75 % cream Apply 1 application topically every morning. APPLY TO FACE    . Multiple Vitamins-Minerals (CENTRUM SILVER ADULT 50+ PO) Take 1 tablet by mouth daily with breakfast.    . Multiple Vitamins-Minerals (PRESERVISION AREDS 2) CAPS Take 1 capsule by mouth 2 (two) times daily.    . potassium chloride SA (K-DUR,KLOR-CON) 20 MEQ tablet Take 2 tablets (40 mEq total) by mouth daily. 30 tablet 0  . Probiotic Product (PROBIOTIC DAILY) CAPS Take 1 capsule by mouth at bedtime.    . Psyllium (METAMUCIL FIBER PO) Take 1 capsule by mouth every morning.    . rivaroxaban (XARELTO) 20 MG TABS tablet Take 1 tablet (20 mg total) by mouth daily with supper. 30 tablet 0  . atorvastatin (LIPITOR) 40 MG tablet Take 1 tablet (40 mg total) by mouth daily. 90 tablet 3   No current  facility-administered medications for this visit.     Allergies:   Other, Peanut oil, Peanut-containing drug products, Sulfa antibiotics, Eliquis [apixaban], and Spironolactone    Social History:  The patient  reports that he has never smoked. He has never used smokeless tobacco.   Family History:  The patient's family history includes Heart attack in his father.    ROS:  Please see the history of present illness.   Otherwise, review of systems are positive for none.   All other systems are reviewed and negative.    PHYSICAL EXAM: VS:  BP (!) 160/72   Pulse 65   Ht 5' 9.5" (1.765 m)   Wt 182 lb 9.6 oz (82.8 kg)   SpO2 100%   BMI 26.58 kg/m  , BMI Body mass index is 26.58 kg/m.  GEN: Well nourished, well developed, in no acute distress  HEENT: normal  Neck: no JVD, carotid bruits, or masses Cardiac: RRR; soft 1/6 systolic murmur LSB/apex, no rubs, or gallops,no edema  Respiratory:  clear to auscultation bilaterally, normal work of breathing GI: soft, nontender, nondistended, + BS MS: no deformity or atrophy  Skin: warm and dry, no rash Neuro:  Strength and sensation are intact Psych: euthymic mood, full affect   EKG:  EKG is ordered today. The ekg ordered today demonstrates NSR with normal Ecg. I have personally reviewed and interpreted this study.    Recent Labs: No results found for requested labs within last 8760 hours.    Lipid Panel No results found for: CHOL, TRIG, HDL, CHOLHDL, VLDL, LDLCALC, LDLDIRECT    Wt Readings from Last 3 Encounters:  02/11/19 182 lb 9.6 oz (82.8 kg)  09/03/15 166 lb 11.2 oz (75.6 kg)      Other studies Reviewed: Additional studies/ records that were reviewed today include:   Cardiac Catheterization: 08/31/2015    Dist RCA lesion, 100% stenosed.  RPDA lesion, 40% stenosed.  Acute Mrg lesion, 99% stenosed.  Ost 1st Diag lesion, 80% stenosed.  Ost 2nd Diag to 2nd Diag lesion, 90% stenosed.  There is mild left ventricular  systolic dysfunction.  Prox Cx to Mid Cx lesion, 99% stenosed. Post intervention, there is a 0% residual stenosis.  1. 3 vessel obstructive CAD with predominant branch vessel disease. This includes the first and second diagonal, PLOM and RV marginal branches of the RCA. The LCx lesion appears to be the culprit vessel 2. Mild LV dysfunction with elevated LV EDP 3. Successful stenting of the mid-distal LCx with DES.   Plan: DAPT with ASA and Plavix for one month then discontinue ASA. Continue Plavix for at least one year. Plan to resume Pradaxa tomorrow. Patient needs additional IV diuresis. Continue beta blocker and add oral nitrate.   Echo 08/25/15:Study Conclusions  - Procedure narrative: Transthoracic echocardiography. Image   quality was adequate. The study was technically difficult. - Left ventricle: The cavity size was normal. There was mild focal   basal hypertrophy of the septum. Systolic function was normal.   Wall motion was normal; there were no regional wall motion   abnormalities. The study is not technically sufficient to allow   evaluation of LV diastolic function. - Aortic valve: Transvalvular velocity was within the normal range.   There was no stenosis. There was mild regurgitation. - Mitral valve: Calcified annulus. There was mild regurgitation. - Left atrium: The atrium was moderately dilated. - Right ventricle: The cavity size was normal. Wall thickness was   normal. Systolic function was normal. - Tricuspid valve: There was mild regurgitation. - Pulmonary arteries: Systolic pressure was moderately increased.   PA peak pressure: 49 mm Hg (S). - Inferior vena cava: The vessel was normal in size. The   respirophasic diameter changes were blunted (< 50%), consistent   with normal central venous pressure.    ASSESSMENT AND PLAN:  1.  CAD. S/p  NSTEMI in setting of CAP in May 2017. S/p DES of mid to distal LCx. Branch vessel disease as noted. Continue antianginal  therapy with nitrates, Coreg. No antiplatelet therapy since on Xarelto. He is currently asymptomatic.   2. Paroxysmal Afib. No recent recurrence. On Xarelto  3. History of MVP with severe MR. S/p MV replacement in 2017 with pericardial valve. Echo one year ago OK. Exam is benign. SBE prophylaxis.   4. Hypercholesterolemia. Will request  copy of recent lab to review.   5. OSA. On CPAP  6. Chronic diastolic CHF. Well compensated.    Current medicines are reviewed at length with the patient today.  The patient does not have concerns regarding medicines.  The following changes have been made:  no change  Labs/ tests ordered today include:   Orders Placed This Encounter  Procedures  . EKG 12-Lead     Disposition:   FU with me in 6 months  Signed, Carena Stream [place], MD  02/11/2019 1:14 PM    [facility] [address] [place], [place], [address] Phone [phone], Fax [phone]

## 2019-02-11 ENCOUNTER — Encounter: Payer: Self-pay | Admitting: Cardiology

## 2019-02-11 ENCOUNTER — Ambulatory Visit (INDEPENDENT_AMBULATORY_CARE_PROVIDER_SITE_OTHER): Payer: Medicare Other | Admitting: Cardiology

## 2019-02-11 ENCOUNTER — Other Ambulatory Visit: Payer: Self-pay

## 2019-02-11 VITALS — BP 160/72 | HR 65 | Ht 69.5 in | Wt 182.6 lb

## 2019-02-11 DIAGNOSIS — I480 Paroxysmal atrial fibrillation: Secondary | ICD-10-CM

## 2019-02-11 DIAGNOSIS — I25118 Atherosclerotic heart disease of native coronary artery with other forms of angina pectoris: Secondary | ICD-10-CM | POA: Diagnosis not present

## 2019-02-11 DIAGNOSIS — I340 Nonrheumatic mitral (valve) insufficiency: Secondary | ICD-10-CM | POA: Diagnosis not present

## 2019-02-11 DIAGNOSIS — I10 Essential (primary) hypertension: Secondary | ICD-10-CM

## 2019-02-11 DIAGNOSIS — Z952 Presence of prosthetic heart valve: Secondary | ICD-10-CM | POA: Diagnosis not present

## 2019-02-11 DIAGNOSIS — E7800 Pure hypercholesterolemia, unspecified: Secondary | ICD-10-CM

## 2019-02-11 MED ORDER — ATORVASTATIN CALCIUM 40 MG PO TABS: 40.0000 mg | ORAL_TABLET | Freq: Every day | ORAL | 3 refills | Status: DC

## 2019-02-11 MED ORDER — FUROSEMIDE 40 MG PO TABS: 20.0000 mg | ORAL_TABLET | Freq: Every day | ORAL | 0 refills | Status: DC

## 2019-02-11 NOTE — Patient Instructions (Signed)
Continue your current therapy  We will request your labs from Dr Shelia Media  Follow up in 6 months.

## 2019-03-28 ENCOUNTER — Telehealth: Payer: Self-pay | Admitting: *Deleted

## 2019-03-28 MED ORDER — ISOSORBIDE MONONITRATE ER 30 MG PO TB24: 30.0000 mg | ORAL_TABLET | Freq: Every day | ORAL | 3 refills | Status: DC

## 2019-03-28 MED ORDER — AMLODIPINE BESYLATE 5 MG PO TABS: 5.0000 mg | ORAL_TABLET | Freq: Every day | ORAL | 3 refills | Status: DC

## 2019-03-28 MED ORDER — POTASSIUM CHLORIDE CRYS ER 20 MEQ PO TBCR: 20.0000 meq | EXTENDED_RELEASE_TABLET | Freq: Every day | ORAL | 3 refills | Status: DC

## 2019-03-28 MED ORDER — RIVAROXABAN 20 MG PO TABS: 20.0000 mg | ORAL_TABLET | Freq: Every day | ORAL | 3 refills | Status: DC

## 2019-03-28 MED ORDER — LOSARTAN POTASSIUM 50 MG PO TABS: 50.0000 mg | ORAL_TABLET | Freq: Two times a day (BID) | ORAL | 3 refills | Status: DC

## 2019-03-28 MED ORDER — EPINEPHRINE 0.3 MG/0.3ML IJ SOAJ: 0.3000 mg | Freq: Once | INTRAMUSCULAR | 3 refills | Status: DC

## 2019-03-28 MED ORDER — FUROSEMIDE 40 MG PO TABS: 20.0000 mg | ORAL_TABLET | Freq: Every day | ORAL | 3 refills | Status: DC

## 2019-03-28 MED ORDER — EPLERENONE 50 MG PO TABS: 50.0000 mg | ORAL_TABLET | Freq: Every day | ORAL | 3 refills | Status: DC

## 2019-03-28 MED ORDER — CARVEDILOL 12.5 MG PO TABS: 12.5000 mg | ORAL_TABLET | Freq: Two times a day (BID) | ORAL | 3 refills | Status: DC

## 2019-03-28 MED ORDER — ATORVASTATIN CALCIUM 40 MG PO TABS: 40.0000 mg | ORAL_TABLET | Freq: Every day | ORAL | 3 refills | Status: DC

## 2019-03-28 MED ORDER — FENOFIBRATE 160 MG PO TABS: 160.0000 mg | ORAL_TABLET | Freq: Every day | ORAL | 3 refills | Status: DC

## 2019-03-28 NOTE — Telephone Encounter (Signed)
Pt left a message on the refill line at [facility] asking for [facility] to call him back.  He states he spoke with her earlier regarding some medication and prescriptions that he will need.

## 2019-03-28 NOTE — Telephone Encounter (Signed)
Returned call to patient he stated he needed 90 day refills on all medication.Advised I will send to [facility] in [place].

## 2019-04-04 ENCOUNTER — Telehealth: Payer: Self-pay | Admitting: Cardiology

## 2019-04-04 MED ORDER — CARVEDILOL 25 MG PO TABS: 25.0000 mg | ORAL_TABLET | Freq: Two times a day (BID) | ORAL | 3 refills | Status: DC

## 2019-04-04 NOTE — Telephone Encounter (Signed)
OK then I would go ahead and increase Coreg to 25 mg bid  Maruice Pieroni [place] MD, [facility]

## 2019-04-04 NOTE — Telephone Encounter (Signed)
Pt updated and verbalized understanding. New orders placed.  

## 2019-04-04 NOTE — Telephone Encounter (Signed)
Spoke with pt who report for about a week, he's noticed increased BP in the morning. This morning BP was 186/94 then after taking medication it dropped to 134/74. Pt report he does notice a slight headache when BP is elevated but no other symptoms.    BP prior to medication: 186/94 178/89 166/88 154/86 156/92

## 2019-04-04 NOTE — Telephone Encounter (Signed)
I would have him take his amlodipine in the evening instead of the morning and see if that makes a difference. If am blood pressure remains high we could increase Coreg to 25 mg bid.   Margia Wiesen Martin[place] MD, [facility]

## 2019-04-04 NOTE — Telephone Encounter (Signed)
Pt voiced that he is currently taking amlodipine at night.

## 2019-04-04 NOTE — Telephone Encounter (Signed)
     Pt c/o BP issue: STAT if pt c/o blurred vision, one-sided weakness or slurred speech  1. What are your last 5 BP readings?   186/94 178/89 134/74 after taking meds 166/88 154/86 156/92    2. Are you having any other symptoms (ex. Dizziness, headache, blurred vision, passed out)? headache  3. What is your BP issue? Elevated BP before he takes medications

## 2019-04-08 ENCOUNTER — Other Ambulatory Visit: Payer: Self-pay

## 2019-04-08 MED ORDER — FUROSEMIDE 20 MG PO TABS: 20.0000 mg | ORAL_TABLET | Freq: Every day | ORAL | 3 refills | Status: DC

## 2019-04-08 MED ORDER — CARVEDILOL 25 MG PO TABS: 25.0000 mg | ORAL_TABLET | Freq: Two times a day (BID) | ORAL | 3 refills | Status: DC

## 2019-07-23 NOTE — Progress Notes (Signed)
Cardiology Office Note   Date:  07/25/2019   ID:  Ivan Mccoy, DOB 03-21-1936, MRN [id number]  PCP:  Merri Brunette, MD  Cardiologist:   Daniela Hernan [place], MD   Chief Complaint  Patient presents with  . Coronary Artery Disease  . Atrial Fibrillation      History of Present Illness: Ivan Mccoy is a 84 y.o. male who is seen at the request of Dr  Melvyn Neth for evaluation of CAD and paroxysmal Afib. He was last seen by me in May 2017. He was followed by Dr. Frederico Hamman of [facility]. He has recently moved here to [facility]. He has a history of atrial fibrillation status post ablation in September 2013. He is not on anticoagulation therapy. He was on Pradaxa in the past. He also has a history of sleep apnea and is compliant with CPAP therapy. Additional past medical history is significant for hypertension and hyperlipidemia. He had a Cardiolite exercise tolerance test 05/06/2011 which was negative for ischemia. Echocardiogram also in January 2013 showed normal left ventricular systolic function with an estimated ejection fraction of 50-55%. There was moderate left ventricular hypertrophy with diastolic dysfunction. The left atrium and right atrium were mildly enlarged. Moderate tricuspid regurgitation and mild pulmonary hypertension was noted at that time.  In May 2017 he presented here with complaints of dyspnea, cough, subjective fever and chills. His CXR showed evidence of bilateral pneumonia and potentially superimposed mild interstitial edema. BNP was abnormal at 1070. Troponin was elevated at 1.15. Ecg showed T wave inversions in V5-6. He was treated for CAP. He was noted to have some episodes of Afib. Echo showed normal EF with mild basal septal hypertrophy. Mild MR and TR. Mild pulmonary HTN. He did undergo left heart cath which revealed 3 vessel obstructive CAD with predominant branch vessel disease. This includes the first and second diagonal, PLOM and RV marginal branches  of the RCA. The LCx lesion appeared to be the culprit vessel and was treated w/ DES to mid distal LCx. He was DC on Plavix and Xarelto.   On subsequent follow up with Dr Melvyn Neth he was noted to have a more significant murmur and Echo showed anterior MV prolapse with moderate MR. TEE showed severe prolapse of the A2 scallop of the anterior leaflet with severe MR. He also had severe pulmonary HTN. Event monitor in 2017 showed NSR with PACs. No Afib. He was referred to [facility] and seen by Dr Silvestre Mesi. On 12/21/15 he had attempt at MV repair but ultimately required MV replacement with a #27 Carentier Edwards pericardial valve. Most recent Echo on November 03, 2017 showed EF 50% with moderate LVH. Moderate LAE. Normally functioning MV prosthesis. Mean gradient 3 mm Hg. Trivial MR. Mild AI and TR.   In December BP was remaining high and we increased Coreg to 25 mg bid.   On follow up today he is doing very well. Works out at gym at [place]. No chest pain, dyspnea, palpitations, or edema. Notes his PSA was elevated and saw urology. Has arthritis in his knee.     Past Medical History:  Diagnosis Date  . Arrhythmia    paroxysmal afib  . CHF (congestive heart failure) (HCC)   . Coronary artery disease   . Heart murmur   . Hyperlipidemia   . Hypertension   . Macular degeneration   . Obstructive sleep apnea     Past Surgical History:  Procedure Laterality Date  . CARDIAC CATHETERIZATION  N/A 08/31/2015   Procedure: Left Heart Cath and Coronary Angiography;  Surgeon: Quincy Prisco M Martinique, MD;  Location: [place] CV LAB;  Service: Cardiovascular;  Laterality: N/A;  . CARDIAC CATHETERIZATION N/A 08/31/2015   Procedure: Coronary Stent Intervention;  Surgeon: Safir Michalec M Martinique, MD;  Location: [place] CV LAB;  Service: Cardiovascular;  Laterality: N/A;  . 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 MITRAL VALVE REPLACEMENT  12/21/2015   #27 Carpentier Edwards pericardial valve.   . TONSILLECTOMY AND  ADENOIDECTOMY       Current Outpatient Medications  Medication Sig Dispense Refill  . amLODipine (NORVASC) 5 MG tablet Take 1 tablet (5 mg total) by mouth daily. 90 tablet 3  . atorvastatin (LIPITOR) 40 MG tablet Take 1 tablet (40 mg total) by mouth daily. 90 tablet 3  . carvedilol (COREG) 25 MG tablet Take 1 tablet (25 mg total) by mouth 2 (two) times daily with a meal. 180 tablet 3  . Cholecalciferol (VITAMIN D3) 25 MCG (1000 UT) CAPS Take 1 tablet by mouth 3 (three) times daily.    . dorzolamide-timolol (COSOPT) 22.3-6.8 MG/ML ophthalmic solution Instill 1 drop into the left eye two times daily  0  . eplerenone (INSPRA) 50 MG tablet Take 1 tablet (50 mg total) by mouth daily. 90 tablet 3  . fenofibrate 160 MG tablet Take 1 tablet (160 mg total) by mouth daily. 90 tablet 3  . furosemide (LASIX) 20 MG tablet Take 1 tablet (20 mg total) by mouth daily. 90 tablet 3  . GLUCOSAMINE HCL PO Take 1 capsule by mouth 3 (three) times daily.    . isosorbide mononitrate (IMDUR) 30 MG 24 hr tablet Take 1 tablet (30 mg total) by mouth daily. 90 tablet 3  . losartan (COZAAR) 50 MG tablet Take 1 tablet (50 mg total) by mouth 2 (two) times daily. 90 tablet 3  . metroNIDAZOLE (METROCREAM) 0.75 % cream Apply 1 application topically every morning. APPLY TO FACE    . Multiple Vitamins-Minerals (CENTRUM SILVER ADULT 50+ PO) Take 1 tablet by mouth daily with breakfast.    . Multiple Vitamins-Minerals (PRESERVISION AREDS 2) CAPS Take 1 capsule by mouth 2 (two) times daily.    . potassium chloride SA (KLOR-CON) 20 MEQ tablet Take 1 tablet (20 mEq total) by mouth daily. 90 tablet 3  . Probiotic Product (PROBIOTIC DAILY) CAPS Take 1 capsule by mouth at bedtime.    . Psyllium (METAMUCIL FIBER PO) Take 1 capsule by mouth every morning.    . rivaroxaban (XARELTO) 20 MG TABS tablet Take 1 tablet (20 mg total) by mouth daily with supper. 90 tablet 3   No current facility-administered medications for this visit.     Allergies:   Other, Peanut oil, Peanut-containing drug products, Sulfa antibiotics, Eliquis [apixaban], and Spironolactone    Social History:  The patient  reports that he has never smoked. He has never used smokeless tobacco.   Family History:  The patient's family history includes Heart attack in his father.    ROS:  Please see the history of present illness.   Otherwise, review of systems are positive for none.   All other systems are reviewed and negative.    PHYSICAL EXAM: VS:  BP 128/64 (BP Location: Left Arm, Cuff Size: Large)   Pulse 66   Ht 5\' 9"  (1.753 m)   Wt 189 lb (85.7 kg)   SpO2 96%   BMI 27.91 kg/m  , BMI Body mass index  is 27.91 kg/m. GEN: Well nourished, well developed, in no acute distress  HEENT: normal  Neck: no JVD, carotid bruits, or masses Cardiac: RRR; soft 1/6 systolic murmur LSB/apex, no rubs, or gallops,no edema  Respiratory:  clear to auscultation bilaterally, normal work of breathing GI: soft, nontender, nondistended, + BS MS: no deformity or atrophy  Skin: warm and dry, no rash Neuro:  Strength and sensation are intact Psych: euthymic mood, full affect   EKG:  EKG is not ordered today.     Recent Labs: No results found for requested labs within last 8760 hours.    Lipid Panel No results found for: CHOL, TRIG, HDL, CHOLHDL, VLDL, LDLCALC, LDLDIRECT    Wt Readings from Last 3 Encounters:  07/25/19 189 lb (85.7 kg)  02/11/19 182 lb 9.6 oz (82.8 kg)  09/03/15 166 lb 11.2 oz (75.6 kg)      Other studies Reviewed: Additional studies/ records that were reviewed today include:   Cardiac Catheterization: 08/31/2015    Dist RCA lesion, 100% stenosed.  RPDA lesion, 40% stenosed.  Acute Mrg lesion, 99% stenosed.  Ost 1st Diag lesion, 80% stenosed.  Ost 2nd Diag to 2nd Diag lesion, 90% stenosed.  There is mild left ventricular systolic dysfunction.  Prox Cx to Mid Cx lesion, 99% stenosed. Post intervention, there is a 0%  residual stenosis.  1. 3 vessel obstructive CAD with predominant branch vessel disease. This includes the first and second diagonal, PLOM and RV marginal branches of the RCA. The LCx lesion appears to be the culprit vessel 2. Mild LV dysfunction with elevated LV EDP 3. Successful stenting of the mid-distal LCx with DES.   Plan: DAPT with ASA and Plavix for one month then discontinue ASA. Continue Plavix for at least one year. Plan to resume Pradaxa tomorrow. Patient needs additional IV diuresis. Continue beta blocker and add oral nitrate.   Echo 08/25/15:Study Conclusions  - Procedure narrative: Transthoracic echocardiography. Image   quality was adequate. The study was technically difficult. - Left ventricle: The cavity size was normal. There was mild focal   basal hypertrophy of the septum. Systolic function was normal.   Wall motion was normal; there were no regional wall motion   abnormalities. The study is not technically sufficient to allow   evaluation of LV diastolic function. - Aortic valve: Transvalvular velocity was within the normal range.   There was no stenosis. There was mild regurgitation. - Mitral valve: Calcified annulus. There was mild regurgitation. - Left atrium: The atrium was moderately dilated. - Right ventricle: The cavity size was normal. Wall thickness was   normal. Systolic function was normal. - Tricuspid valve: There was mild regurgitation. - Pulmonary arteries: Systolic pressure was moderately increased.   PA peak pressure: 49 mm Hg (S). - Inferior vena cava: The vessel was normal in size. The   respirophasic diameter changes were blunted (< 50%), consistent   with normal central venous pressure.    ASSESSMENT AND PLAN:  1.  CAD. S/p  NSTEMI in setting of CAP in May 2017. S/p DES of mid to distal LCx. Branch vessel disease as noted. Continue antianginal therapy with nitrates, Coreg. No antiplatelet therapy since on Xarelto. He is currently  asymptomatic.   2. Paroxysmal Afib. No recurrence. On Xarelto  3. History of MVP with severe MR. S/p MV replacement in 2017 with pericardial valve. Echo one year ago OK. Exam is benign. SBE prophylaxis.   4. Hypercholesterolemia. Labs monitored by primary care  5. OSA. On  CPAP  6. Chronic diastolic CHF. Well compensated.    Current medicines are reviewed at length with the patient today.  The patient does not have concerns regarding medicines.  The following changes have been made:  no change  Labs/ tests ordered today include:   No orders of the defined types were placed in this encounter.    Disposition:   FU with me in 6 months  Signed, Ivan Luera [place], MD  07/25/2019 1:44 PM    [facility] [address] [place], [place], [address] Phone [phone], Fax [phone]

## 2019-07-25 ENCOUNTER — Encounter: Payer: Self-pay | Admitting: Cardiology

## 2019-07-25 ENCOUNTER — Other Ambulatory Visit: Payer: Self-pay

## 2019-07-25 ENCOUNTER — Ambulatory Visit (INDEPENDENT_AMBULATORY_CARE_PROVIDER_SITE_OTHER): Payer: Medicare Other | Admitting: Cardiology

## 2019-07-25 VITALS — BP 128/64 | HR 66 | Ht 69.0 in | Wt 189.0 lb

## 2019-07-25 DIAGNOSIS — I25118 Atherosclerotic heart disease of native coronary artery with other forms of angina pectoris: Secondary | ICD-10-CM | POA: Diagnosis not present

## 2019-07-25 DIAGNOSIS — I340 Nonrheumatic mitral (valve) insufficiency: Secondary | ICD-10-CM

## 2019-07-25 DIAGNOSIS — I10 Essential (primary) hypertension: Secondary | ICD-10-CM

## 2019-07-25 DIAGNOSIS — E7800 Pure hypercholesterolemia, unspecified: Secondary | ICD-10-CM

## 2019-07-25 DIAGNOSIS — Z952 Presence of prosthetic heart valve: Secondary | ICD-10-CM

## 2019-07-25 DIAGNOSIS — I480 Paroxysmal atrial fibrillation: Secondary | ICD-10-CM

## 2019-11-11 ENCOUNTER — Telehealth: Payer: Self-pay | Admitting: Cardiology

## 2019-11-11 NOTE — Telephone Encounter (Signed)
*  STAT* If patient is at the pharmacy, call can be transferred to refill team.   1. Which medications need to be refilled? (please list name of each medication and dose if known) new prescriptions for Furosemide and Fenofibrate  2. Which pharmacy/location (including street and city if local pharmacy) is medication to be sent to? [facility]- [place]  3. Do they need a 30 day or 90 day supply?90 days and refills

## 2019-11-12 MED ORDER — FENOFIBRATE 160 MG PO TABS: 160.0000 mg | ORAL_TABLET | Freq: Every day | ORAL | 3 refills | Status: DC

## 2019-11-12 MED ORDER — FUROSEMIDE 20 MG PO TABS: 20.0000 mg | ORAL_TABLET | Freq: Every day | ORAL | 3 refills | Status: DC

## 2020-01-16 NOTE — Progress Notes (Signed)
Cardiology Office Note   Date:  01/21/2020   ID:  Ivan Mccoy, DOB 04-Dec-1935, MRN [id number]  PCP:  Merri Brunette, MD  Cardiologist:   Ivan Mccaffery [place], MD   Chief Complaint  Patient presents with  . Mitral Regurgitation  . Coronary Artery Disease  . Atrial Fibrillation      History of Present Illness: Ivan Mccoy is a 84 y.o. male who is seen at the request of Ivan Mccoy for evaluation of CAD and paroxysmal Afib.  He lives at [facility]. He has a history of atrial fibrillation status post ablation in September 2013.  He also has a history of sleep apnea and is compliant with CPAP therapy. Additional past medical history is significant for hypertension and hyperlipidemia. He had a Cardiolite exercise tolerance test 05/06/2011 which was negative for ischemia. Echocardiogram also in January 2013 showed normal left ventricular systolic function with an estimated ejection fraction of 50-55%. There was moderate left ventricular hypertrophy with diastolic dysfunction. The left atrium and right atrium were mildly enlarged. Moderate tricuspid regurgitation and mild pulmonary hypertension was noted at that time.  In May 2017 he presented here with complaints of dyspnea, cough, subjective fever and chills. His CXR showed evidence of bilateral pneumonia and potentially superimposed mild interstitial edema. BNP was abnormal at 1070. Troponin was elevated at 1.15. Ecg showed T wave inversions in V5-6. He was treated for CAP. He was noted to have some episodes of Afib. Echo showed normal EF with mild basal septal hypertrophy. Mild MR and TR. Mild pulmonary HTN. He did undergo left heart cath which revealed 3 vessel obstructive CAD with predominant branch vessel disease. This includes the first and second diagonal, PLOM and RV marginal branches of the RCA. The LCx lesion appeared to be the culprit vessel and was treated w/ DES to mid distal LCx. He was DC on Plavix and Xarelto.   On subsequent follow  up with his cardiologist in [place] he was noted to have a more significant murmur and Echo showed anterior MV prolapse with moderate MR. TEE showed severe prolapse of the A2 scallop of the anterior leaflet with severe MR. He also had severe pulmonary HTN. Event monitor in 2017 showed NSR with PACs. No Afib. He was referred to [facility] and seen by Ivan Mccoy. On 12/21/15 he had attempt at MV repair but ultimately required MV replacement with a #27 Carentier Edwards pericardial valve. Most recent Echo on November 03, 2017 showed EF 50% with moderate LVH. Moderate LAE. Normally functioning MV prosthesis. Mean gradient 3 mm Hg. Trivial MR. Mild AI and TR.   On follow up today he is doing very well. Works out at gym at [employer] occasionally but more recently has been travelling more.  No chest pain, dyspnea, palpitations, or edema.     Past Medical History:  Diagnosis Date  . Arrhythmia    paroxysmal afib  . CHF (congestive heart failure) (HCC)   . Coronary artery disease   . Heart murmur   . Hyperlipidemia   . Hypertension   . Macular degeneration   . Obstructive sleep apnea     Past Surgical History:  Procedure Laterality Date  . CARDIAC CATHETERIZATION N/A 08/31/2015   Procedure: Left Heart Cath and Coronary Angiography;  Surgeon: Ivan Siska M [place], MD;  Location: [facility] INVASIVE CV LAB;  Service: Cardiovascular;  Laterality: N/A;  . CARDIAC CATHETERIZATION N/A 08/31/2015   Procedure: Coronary Stent Intervention;  Surgeon: Sebastian Dzik M [place], MD;  Location: [facility]  INVASIVE CV LAB;  Service: Cardiovascular;  Laterality: N/A;  . 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 MITRAL VALVE REPLACEMENT  12/21/2015   #27 Carpentier Edwards pericardial valve.   . TONSILLECTOMY AND ADENOIDECTOMY       Current Outpatient Medications  Medication Sig Dispense Refill  . amLODipine (NORVASC) 5 MG tablet Take 1 tablet (5 mg total) by mouth daily. 90 tablet 3  . atorvastatin (LIPITOR) 40 MG tablet Take 1 tablet  (40 mg total) by mouth daily. 90 tablet 3  . carvedilol (COREG) 12.5 MG tablet Take 1 tablet (12.5 mg total) by mouth 2 (two) times daily. 180 tablet 3  . Cholecalciferol (VITAMIN D3) 25 MCG (1000 UT) CAPS Take 1 tablet by mouth 3 (three) times daily.    [name] eplerenone (INSPRA) 50 MG tablet Take 1 tablet (50 mg total) by mouth daily. 90 tablet 3  . fenofibrate 160 MG tablet Take 1 tablet (160 mg total) by mouth daily. 90 tablet 3  . furosemide (LASIX) 20 MG tablet Take 1 tablet (20 mg total) by mouth daily. 90 tablet 3  . GLUCOSAMINE HCL PO Take 1 capsule by mouth 3 (three) times daily.    . isosorbide mononitrate (IMDUR) 30 MG 24 hr tablet Take 1 tablet (30 mg total) by mouth daily. 90 tablet 3  . losartan (COZAAR) 50 MG tablet Take 1 tablet (50 mg total) by mouth 2 (two) times daily. 90 tablet 3  . metroNIDAZOLE (METROCREAM) 0.75 % cream Apply 1 application topically every morning. APPLY TO FACE    . Multiple Vitamins-Minerals (CENTRUM SILVER ADULT 50+ PO) Take 1 tablet by mouth daily with breakfast.    . Multiple Vitamins-Minerals (PRESERVISION AREDS 2) CAPS Take 1 capsule by mouth 2 (two) times daily.    . potassium chloride SA (KLOR-CON) 20 MEQ tablet Take 1 tablet (20 mEq total) by mouth daily. 90 tablet 3  . Probiotic Product (PROBIOTIC DAILY) CAPS Take 1 capsule by mouth at bedtime.    . Psyllium (METAMUCIL FIBER PO) Take 1 capsule by mouth every morning.    . rivaroxaban (XARELTO) 20 MG TABS tablet Take 1 tablet (20 mg total) by mouth daily with supper. 90 tablet 3   No current facility-administered medications for this visit.    Allergies:   Other, Peanut oil, Peanut-containing drug products, Sulfa antibiotics, Eliquis [apixaban], and Spironolactone    Social History:  The patient  reports that he has never smoked. He has never used smokeless tobacco.   Family History:  The patient's family history includes Heart attack in his father.    ROS:  Please see the history of present  illness.   Otherwise, review of systems are positive for none.   All other systems are reviewed and negative.    PHYSICAL EXAM: VS:  BP 120/78 (BP Location: Right Arm, Patient Position: Sitting)   Pulse 63   Ht 5\' 9"  (1.753 m)   Wt 190 lb 3.2 oz (86.3 kg)   BMI 28.09 kg/m  , BMI Body mass index is 28.09 kg/m. GEN: Well nourished, well developed, in no acute distress  HEENT: normal  Neck: no JVD, carotid bruits, or masses Cardiac: RRR; soft 1/6 systolic murmur LSB/apex, no rubs, or gallops,no edema  Respiratory:  clear to auscultation bilaterally, normal work of breathing GI: soft, nontender, nondistended, + BS MS: no deformity or atrophy  Skin: warm and dry, no rash Neuro:  Strength and sensation are intact Psych: euthymic mood, full  affect   EKG:  EKG is ordered today. NSR with rate 63. Normal. I have personally reviewed and interpreted this study.   Recent Labs: No results found for requested labs within last 8760 hours.    Lipid Panel No results found for: CHOL, TRIG, HDL, CHOLHDL, VLDL, LDLCALC, LDLDIRECT   Dated 01/15/19: LDL 43, triglycerides 81, TSH normal.   Cardiac Catheterization: 08/31/2015    Dist RCA lesion, 100% stenosed.  RPDA lesion, 40% stenosed.  Acute Mrg lesion, 99% stenosed.  Ost 1st Diag lesion, 80% stenosed.  Ost 2nd Diag to 2nd Diag lesion, 90% stenosed.  There is mild left ventricular systolic dysfunction.  Prox Cx to Mid Cx lesion, 99% stenosed. Post intervention, there is a 0% residual stenosis.  1. 3 vessel obstructive CAD with predominant branch vessel disease. This includes the first and second diagonal, PLOM and RV marginal branches of the RCA. The LCx lesion appears to be the culprit vessel 2. Mild LV dysfunction with elevated LV EDP 3. Successful stenting of the mid-distal LCx with DES.   Plan: DAPT with ASA and Plavix for one month then discontinue ASA. Continue Plavix for at least one year. Plan to resume Pradaxa tomorrow.  Patient needs additional IV diuresis. Continue beta blocker and add oral nitrate.   Echo 08/25/15:Study Conclusions  - Procedure narrative: Transthoracic echocardiography. Image   quality was adequate. The study was technically difficult. - Left ventricle: The cavity size was normal. There was mild focal   basal hypertrophy of the septum. Systolic function was normal.   Wall motion was normal; there were no regional wall motion   abnormalities. The study is not technically sufficient to allow   evaluation of LV diastolic function. - Aortic valve: Transvalvular velocity was within the normal range.   There was no stenosis. There was mild regurgitation. - Mitral valve: Calcified annulus. There was mild regurgitation. - Left atrium: The atrium was moderately dilated. - Right ventricle: The cavity size was normal. Wall thickness was   normal. Systolic function was normal. - Tricuspid valve: There was mild regurgitation. - Pulmonary arteries: Systolic pressure was moderately increased.   PA peak pressure: 49 mm Hg (S). - Inferior vena cava: The vessel was normal in size. The   respirophasic diameter changes were blunted (< 50%), consistent   with normal central venous pressure.    ASSESSMENT AND PLAN:  1.  CAD. S/p  NSTEMI in setting of CAP in May 2017. S/p DES of mid to distal LCx. Branch vessel disease as noted. Continue antianginal therapy with nitrates, Coreg. No antiplatelet therapy since on Xarelto. He is  asymptomatic.   2. Paroxysmal Afib. No recurrence. On Xarelto  3. History of MVP with severe MR. S/p MV replacement in 2017 with pericardial valve. Echo one year ago OK. Exam is normal. SBE prophylaxis.   4. Hypercholesterolemia. Labs monitored by primary care. Patient states he is going to discuss with Ivan Renne Crigler about updating his labs.   5. OSA. On CPAP  6. Chronic diastolic CHF. Well compensated.    Current medicines are reviewed at length with the patient today.  The  patient does not have concerns regarding medicines.  The following changes have been made:  no change  Labs/ tests ordered today include:   No orders of the defined types were placed in this encounter.    Disposition:   FU with me in 6 months  Signed, Edvardo Honse [place], MD  01/21/2020 2:16 PM    [facility]  [address], [place], [place], [address] Phone [phone], Fax [phone]

## 2020-01-21 ENCOUNTER — Encounter: Payer: Self-pay | Admitting: Cardiology

## 2020-01-21 ENCOUNTER — Other Ambulatory Visit: Payer: Self-pay

## 2020-01-21 ENCOUNTER — Ambulatory Visit (INDEPENDENT_AMBULATORY_CARE_PROVIDER_SITE_OTHER): Payer: Medicare Other | Admitting: Cardiology

## 2020-01-21 VITALS — BP 120/78 | HR 63 | Ht 69.0 in | Wt 190.2 lb

## 2020-01-21 DIAGNOSIS — E7800 Pure hypercholesterolemia, unspecified: Secondary | ICD-10-CM

## 2020-01-21 DIAGNOSIS — Z952 Presence of prosthetic heart valve: Secondary | ICD-10-CM | POA: Diagnosis not present

## 2020-01-21 DIAGNOSIS — I25118 Atherosclerotic heart disease of native coronary artery with other forms of angina pectoris: Secondary | ICD-10-CM

## 2020-01-21 DIAGNOSIS — I480 Paroxysmal atrial fibrillation: Secondary | ICD-10-CM

## 2020-01-21 DIAGNOSIS — I340 Nonrheumatic mitral (valve) insufficiency: Secondary | ICD-10-CM

## 2020-01-21 NOTE — Addendum Note (Signed)
Addended by: Neoma Laming on: 01/21/2020 02:23 PM   Modules accepted: Orders

## 2020-02-14 ENCOUNTER — Other Ambulatory Visit: Payer: Self-pay

## 2020-03-31 ENCOUNTER — Telehealth: Payer: Self-pay | Admitting: Cardiology

## 2020-03-31 NOTE — Telephone Encounter (Signed)
Dr. Marlou Porch ( Urologist) wants the patient to have an MRI. The patient was told to check with his Cardiologist before scheduling an MRI because he has a stent.   Patient would like Dr. Elvis Coil advice

## 2020-03-31 NOTE — Telephone Encounter (Signed)
Spoke with Dr. Flora Lipps as Dr. [place] is out of the office. Patient is okay to proceed with MRI even though he has a stent. Patient to call PCP back.

## 2020-04-06 ENCOUNTER — Other Ambulatory Visit: Payer: Self-pay | Admitting: Urology

## 2020-04-06 DIAGNOSIS — R972 Elevated prostate specific antigen [PSA]: Secondary | ICD-10-CM

## 2020-04-22 ENCOUNTER — Other Ambulatory Visit: Payer: Self-pay | Admitting: Cardiology

## 2020-05-02 ENCOUNTER — Other Ambulatory Visit: Payer: Medicare Other

## 2020-05-12 ENCOUNTER — Other Ambulatory Visit: Payer: Self-pay

## 2020-05-12 ENCOUNTER — Ambulatory Visit
Admission: RE | Admit: 2020-05-12 | Discharge: 2020-05-12 | Disposition: A | Discharge status: Home / Self Care | Payer: Medicare Other | Source: Ambulatory Visit | Attending: Urology | Admitting: Urology

## 2020-05-12 DIAGNOSIS — R972 Elevated prostate specific antigen [PSA]: Secondary | ICD-10-CM

## 2020-05-12 IMAGING — MR MR PROSTATE WO/W CM
12 series · 48 of 48 positions shown · IV contrast (multihance)
Comparison: None.

CLINICAL DATA: Elevated PSA.

EXAM:
MR PROSTATE WITHOUT AND WITH CONTRAST
TECHNIQUE: Multiplanar multisequence MRI images were obtained of the pelvis
centered about the prostate. Pre and post contrast images were
obtained.
CONTRAST:  17mL MULTIHANCE GADOBENATE DIMEGLUMINE 529 MG/ML IV SOLN

[Series 3: T2 · coronal · 3.0mm · 0.56mm/px · 1 of 23 slices shown (1 of 3)]
[im 1/23]
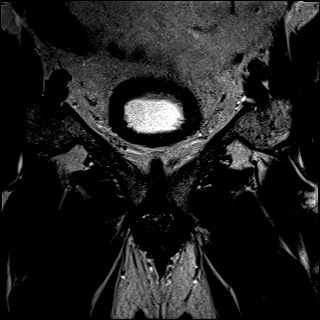

[Series 4: T1 · axial · 5.0mm · 1.25mm/px · 1 of 88 slices shown]
[im 1/88]
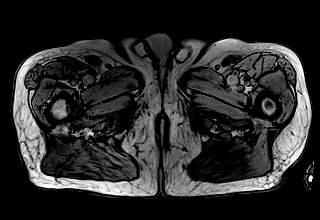

[Series 5: DWI · axial · 3.0mm · 1.75mm/px · z∈[-75,-3]mm · 2 of 75 slices shown (1 of 3)]
[im 1/75]
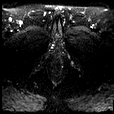
[im 75/75]
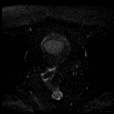

[Series 6: DWI · axial · 3.0mm · 1.75mm/px · 1 of 25 slices shown (2 of 3)]
[im 1/25]
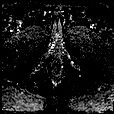

[Series 7: DWI · axial · 3.0mm · 1.75mm/px · 1 of 25 slices shown (3 of 3)]
[im 1/25]
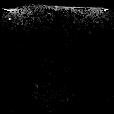

[Series 8: T2 · axial · 3.0mm · 0.56mm/px · 1 of 23 slices shown (2 of 3)]
[im 1/23]
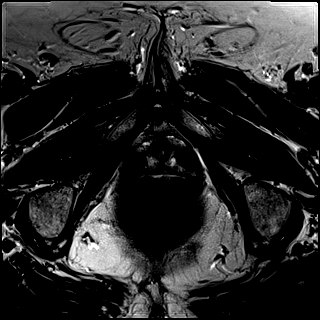

[Series 9: T2 · axial · 1.0mm · 1.04mm/px · z∈[-76,+3]mm · 2 of 80 slices shown (3 of 3)]
[im 1/80]
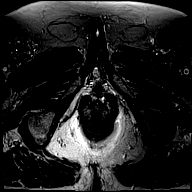
[im 80/80]
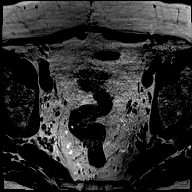

[Series 10: pre t1_twist_tra_dyn · axial · non-contrast · 3.5mm · 0.83mm/px · 1 of 22 slices shown]
[im 1/22]
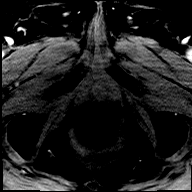

[Series 11: post t1_twist_tra_dyn-copy center · axial · non-contrast · 3.5mm · 0.83mm/px · z∈[-75,-1]mm · 17 of 660 slices shown]
[im 1/660]
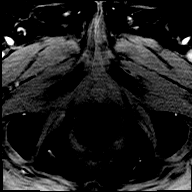
[im 42/660]
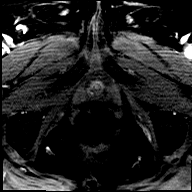
[im 83/660]
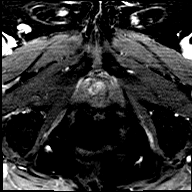
[im 124/660]
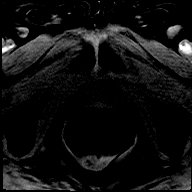
[im 165/660]
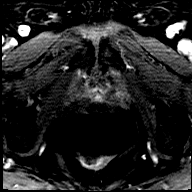
[im 206/660]
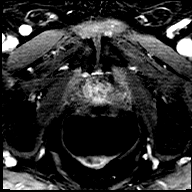
[im 248/660]
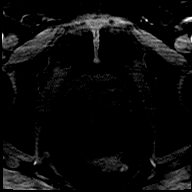
[im 289/660]
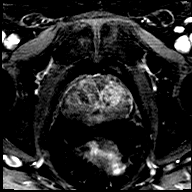
[im 330/660]
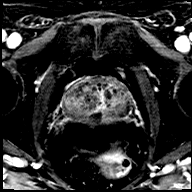
[im 371/660]
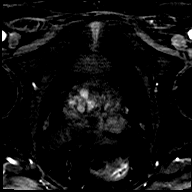
[im 412/660]
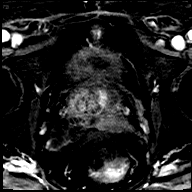
[im 454/660]
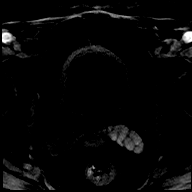
[im 495/660]
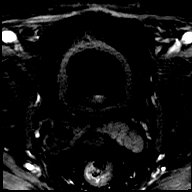
[im 536/660]
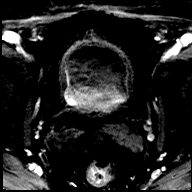
[im 577/660]
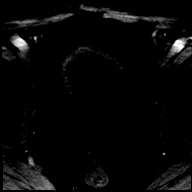
[im 618/660]
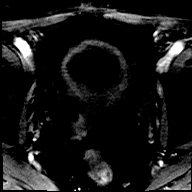
[im 660/660]
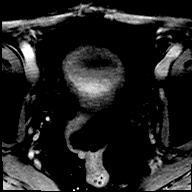

[Series 12: post t1_twist_tra_dyn-copy cent_sub · axial · 3.5mm · 0.83mm/px · z∈[-75,-1]mm · 17 of 635 slices shown]
[im 1/635]
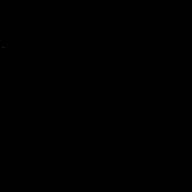
[im 40/635]
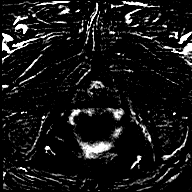
[im 80/635]
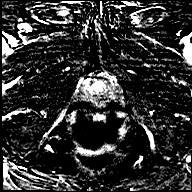
[im 119/635]
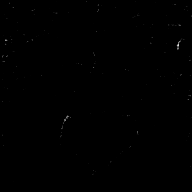
[im 159/635]
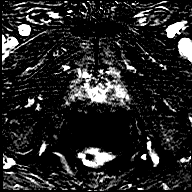
[im 199/635]
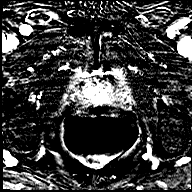
[im 238/635]
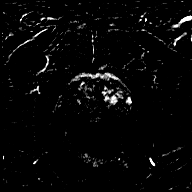
[im 278/635]
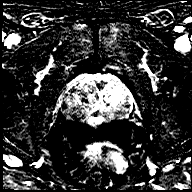
[im 318/635]
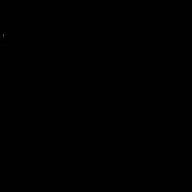
[im 357/635]
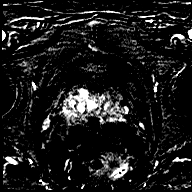
[im 397/635]
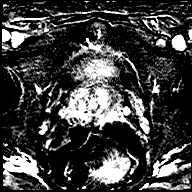
[im 436/635]
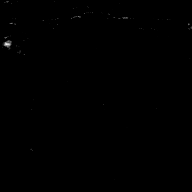
[im 476/635]
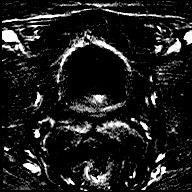
[im 516/635]
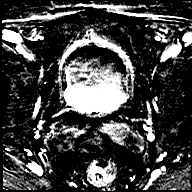
[im 555/635]
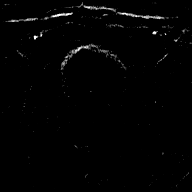
[im 595/635]
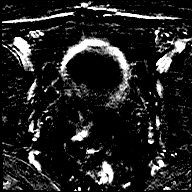
[im 635/635]
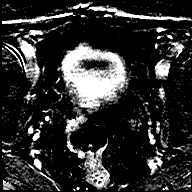

[Series 13: t1_vibe_dixon_tra_f · axial · 2.5mm · 0.91mm/px · z∈[-89,+109]mm · 2 of 80 slices shown]
[im 1/80]
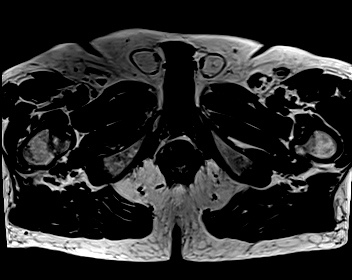
[im 80/80]
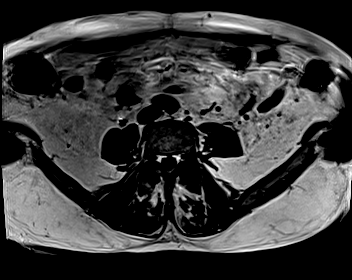

[Series 14: t1_vibe_dixon_tra_w · axial · 2.5mm · 0.91mm/px · z∈[-89,+109]mm · 2 of 80 slices shown]
[im 1/80]
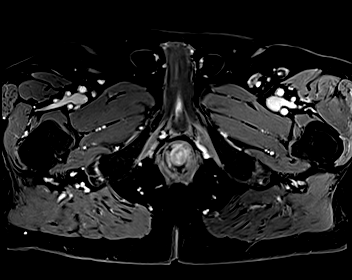
[im 80/80]
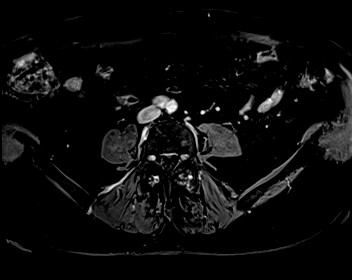

[48 of 48 positions shown; findings below may reference images not displayed]

FINDINGS: Prostate:

-- Peripheral Zone: Linear/wedge shaped hypointensities are noted on
ADC. 11 x 6 mm poorly defined nodule is seen in the right anterior
apex on image [DATE], near junction with transition zone. This shows
marked ADC hypointensity, but no significant DWI hyperintensity or
early focal contrast enhancement.

-- Transition/Central Zone: Mildly enlarged with small circumscribed
BPH nodules noted, but no suspicious nodules with obscured or
non-circumscribed margins seen.

-- Measurements/Volume:  4.4 x 3.7 x 5.9 cm (volume = 50 cm^3)

Transcapsular spread:  Absent

Seminal vesicle involvement:  Absent

Neurovascular bundle involvement:  Absent

Pelvic adenopathy: None visualized

Bone metastasis: None visualized

Other: Diffuse bladder wall thickening, consistent with chronic
bladder outlet obstruction.
IMPRESSION: 11 mm nodule in the right anterior apex, which is indeterminate.
PI-RADS 3: Intermediate (the presence of clinically significant
cancer is equivocal)

No evidence of extracapsular extension or pelvic metastatic disease.

(I have processed this exam in the DynaCAD application for MR/US
fusion-guided biopsy if performed.)

## 2020-05-12 MED ORDER — GADOBENATE DIMEGLUMINE 529 MG/ML IV SOLN
17.0000 mL | Freq: Once | INTRAVENOUS | Status: AC | PRN
  Administered 2020-05-12: 17 mL via INTRAVENOUS

## 2020-05-22 ENCOUNTER — Other Ambulatory Visit: Payer: Self-pay | Admitting: Cardiology

## 2020-05-25 MED ORDER — CARVEDILOL 25 MG PO TABS: 25.0000 mg | ORAL_TABLET | Freq: Two times a day (BID) | ORAL | 1 refills | Status: DC

## 2020-06-24 ENCOUNTER — Other Ambulatory Visit: Payer: Self-pay | Admitting: Cardiology

## 2020-06-26 ENCOUNTER — Other Ambulatory Visit: Payer: Self-pay | Admitting: Cardiology

## 2020-07-17 NOTE — Progress Notes (Signed)
Cardiology Office Note   Date:  07/22/2020   ID:  Ivan Mccoy, DOB [birth year], MRN [id number]  PCP:  Ivan Brunette, MD  Cardiologist:   Ivan Creech [place], MD   Chief Complaint  Patient presents with  . Coronary Artery Disease      History of Present Illness: Ivan Mccoy is a 85 y.o. male who is seen at the request of Dr  Ivan Mccoy for evaluation of CAD and paroxysmal Afib.  He lives at [facility]. He has a history of atrial fibrillation status post ablation in September 2013.  He also has a history of sleep apnea and is compliant with CPAP therapy. Additional past medical history is significant for hypertension and hyperlipidemia. He had a Cardiolite exercise tolerance test 05/06/2011 which was negative for ischemia. Echocardiogram also in January 2013 showed normal left ventricular systolic function with an estimated ejection fraction of 50-55%. There was moderate left ventricular hypertrophy with diastolic dysfunction. The left atrium and right atrium were mildly enlarged. Moderate tricuspid regurgitation and mild pulmonary hypertension was noted at that time.  In May 2017 he presented here with complaints of dyspnea, cough, subjective fever and chills. His CXR showed evidence of bilateral pneumonia and potentially superimposed mild interstitial edema. BNP was abnormal at 1070. Troponin was elevated at 1.15. Ecg showed T wave inversions in V5-6. He was treated for CAP. He was noted to have some episodes of Afib. Echo showed normal EF with mild basal septal hypertrophy. Mild MR and TR. Mild pulmonary HTN. He did undergo left heart cath which revealed 3 vessel obstructive CAD with predominant branch vessel disease. This includes the first and second diagonal, PLOM and RV marginal branches of the RCA. The LCx lesion appeared to be the culprit vessel and was treated w/ DES to mid distal LCx. He was DC on Plavix and Xarelto.   On subsequent follow up with his cardiologist in [place] he was  noted to have a more significant murmur and Echo showed anterior MV prolapse with moderate MR. TEE showed severe prolapse of the A2 scallop of the anterior leaflet with severe MR. He also had severe pulmonary HTN. Event monitor in 2017 showed NSR with PACs. No Afib. He was referred to [facility] and seen by Dr Silvestre Mesi. On 12/21/15 he had attempt at MV repair but ultimately required MV replacement with a #27 Carentier Edwards pericardial valve. Most recent Echo on November 03, 2017 showed EF 50% with moderate LVH. Moderate LAE. Normally functioning MV prosthesis. Mean gradient 3 mm Hg. Trivial MR. Mild AI and TR.   On follow up today he is doing very well. Works out at gym at [employer]. He has been doing a lot of travelling. No chest pain, dyspnea, palpitations, or edema. No bleeding. Weight is down 7 lbs. May eventually need TKR.     Past Medical History:  Diagnosis Date  . Arrhythmia    paroxysmal afib  . CHF (congestive heart failure) (HCC)   . Coronary artery disease   . Heart murmur   . Hyperlipidemia   . Hypertension   . Macular degeneration   . Obstructive sleep apnea     Past Surgical History:  Procedure Laterality Date  . CARDIAC CATHETERIZATION N/A 08/31/2015   Procedure: Left Heart Cath and Coronary Angiography;  Surgeon: Ivan Douty M [place], MD;  Location: [facility] INVASIVE CV LAB;  Service: Cardiovascular;  Laterality: N/A;  . CARDIAC CATHETERIZATION N/A 08/31/2015   Procedure: Coronary Stent Intervention;  Surgeon: Ivan Few M [place], MD;  Location: [facility] INVASIVE CV LAB;  Service: Cardiovascular;  Laterality: N/A;  . 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 MITRAL VALVE REPLACEMENT  12/21/2015   #27 Carpentier Edwards pericardial valve.   . TONSILLECTOMY AND ADENOIDECTOMY       Current Outpatient Medications  Medication Sig Dispense Refill  . amLODipine (NORVASC) 5 MG tablet TAKE 1 TABLET BY MOUTH DAILY 90 tablet 3  . carvedilol (COREG) 25 MG tablet Take 1 tablet (25 mg total) by mouth 2  (two) times daily. 180 tablet 1  . Cholecalciferol (VITAMIN D3) 25 MCG (1000 UT) CAPS Take 1 tablet by mouth 3 (three) times daily.    [name] eplerenone (INSPRA) 50 MG tablet Take 1 tablet (50 mg total) by mouth daily. 90 tablet 3  . fenofibrate 160 MG tablet Take 1 tablet (160 mg total) by mouth daily. 90 tablet 3  . furosemide (LASIX) 20 MG tablet Take 1 tablet (20 mg total) by mouth daily. 90 tablet 3  . GLUCOSAMINE HCL PO Take 1 capsule by mouth 3 (three) times daily.    . isosorbide mononitrate (IMDUR) 30 MG 24 hr tablet Take 1 tablet (30 mg total) by mouth daily. 90 tablet 3  . losartan (COZAAR) 50 MG tablet TAKE ONE TABLET BY MOUTH TWICE DAILY 90 tablet 3  . metroNIDAZOLE (METROCREAM) 0.75 % cream Apply 1 application topically every morning. APPLY TO FACE    . Multiple Vitamins-Minerals (CENTRUM SILVER ADULT 50+ PO) Take 1 tablet by mouth daily with breakfast.    . Multiple Vitamins-Minerals (PRESERVISION AREDS 2) CAPS Take 1 capsule by mouth 2 (two) times daily.    . potassium chloride SA (KLOR-CON) 20 MEQ tablet TAKE 1 TABLET BY MOUTH DAILY 90 tablet 1  . Probiotic Product (PROBIOTIC DAILY) CAPS Take 1 capsule by mouth at bedtime.    . Psyllium (METAMUCIL FIBER PO) Take 1 capsule by mouth every morning.    Ivan Mccoy 20 MG TABS tablet TAKE 1 TABLET BY MOUTH DAILY WITH SUPPER 90 tablet 3  . atorvastatin (LIPITOR) 40 MG tablet Take 1 tablet (40 mg total) by mouth daily. 90 tablet 3   No current facility-administered medications for this visit.    Allergies:   Other, Peanut oil, Peanut-containing drug products, Sulfa antibiotics, Eliquis [apixaban], and Spironolactone    Social History:  The patient  reports that he has never smoked. He has never used smokeless tobacco.   Family History:  The patient's family history includes Heart attack in his father.    ROS:  Please see the history of present illness.   Otherwise, review of systems are positive for none.   All other systems are  reviewed and negative.    PHYSICAL EXAM: VS:  BP 140/72   Pulse 65   Ht 5\' 9"  (1.753 m)   Wt 183 lb (83 kg)   SpO2 95%   BMI 27.02 kg/m  , BMI Body mass index is 27.02 kg/m. GEN: Well nourished, well developed, in no acute distress  HEENT: normal  Neck: no JVD, carotid bruits, or masses Cardiac: RRR; soft 1/6 systolic murmur LSB/apex, no rubs, or gallops,no edema  Respiratory:  clear to auscultation bilaterally, normal work of breathing GI: soft, nontender, nondistended, + BS MS: no deformity or atrophy  Skin: warm and dry, no rash Neuro:  Strength and sensation are intact Psych: euthymic mood, full affect   EKG:  EKG is not ordered today.    Recent Labs: No results found  for requested labs within last 8760 hours.    Lipid Panel No results found for: CHOL, TRIG, HDL, CHOLHDL, VLDL, LDLCALC, LDLDIRECT   Dated 01/15/19: LDL 43, triglycerides 81, TSH normal.  Dated 05/22/20: cholesterol 93, triglycerides 86, HDL 34, LDL 42. GFR 59.   Cardiac Catheterization: 08/31/2015    Dist RCA lesion, 100% stenosed.  RPDA lesion, 40% stenosed.  Acute Mrg lesion, 99% stenosed.  Ost 1st Diag lesion, 80% stenosed.  Ost 2nd Diag to 2nd Diag lesion, 90% stenosed.  There is mild left ventricular systolic dysfunction.  Prox Cx to Mid Cx lesion, 99% stenosed. Post intervention, there is a 0% residual stenosis.  1. 3 vessel obstructive CAD with predominant branch vessel disease. This includes the first and second diagonal, PLOM and RV marginal branches of the RCA. The LCx lesion appears to be the culprit vessel 2. Mild LV dysfunction with elevated LV EDP 3. Successful stenting of the mid-distal LCx with DES.   Plan: DAPT with ASA and Plavix for one month then discontinue ASA. Continue Plavix for at least one year. Plan to resume Pradaxa tomorrow. Patient needs additional IV diuresis. Continue beta blocker and add oral nitrate.   Echo 08/25/15:Study Conclusions  - Procedure  narrative: Transthoracic echocardiography. Image   quality was adequate. The study was technically difficult. - Left ventricle: The cavity size was normal. There was mild focal   basal hypertrophy of the septum. Systolic function was normal.   Wall motion was normal; there were no regional wall motion   abnormalities. The study is not technically sufficient to allow   evaluation of LV diastolic function. - Aortic valve: Transvalvular velocity was within the normal range.   There was no stenosis. There was mild regurgitation. - Mitral valve: Calcified annulus. There was mild regurgitation. - Left atrium: The atrium was moderately dilated. - Right ventricle: The cavity size was normal. Wall thickness was   normal. Systolic function was normal. - Tricuspid valve: There was mild regurgitation. - Pulmonary arteries: Systolic pressure was moderately increased.   PA peak pressure: 49 mm Hg (S). - Inferior vena cava: The vessel was normal in size. The   respirophasic diameter changes were blunted (< 50%), consistent   with normal central venous pressure.    ASSESSMENT AND PLAN:  1.  CAD. S/p  NSTEMI in setting of CAP in May 2017. S/p DES of mid to distal LCx. Branch vessel disease as noted. Continue antianginal therapy with nitrates, Coreg, amlodipine. No antiplatelet therapy since on Xarelto. He is  asymptomatic.   2. Paroxysmal Afib. No recurrence documented since his valve surgery in 2017. He is at higher risk for recurrence. [place] Vasc score of 4. I would advise to continue Xarelto unless he was to have problems with bleeding.   3. History of MVP with severe MR. S/p MV replacement in 2017 with pericardial valve. Echo one year ago OK. Exam is normal. SBE prophylaxis.   4. Hypercholesterolemia. Excellent control.   5. OSA. On CPAP  6. Chronic diastolic CHF. Well compensated.    Current medicines are reviewed at length with the patient today.  The patient does not have concerns regarding  medicines.  The following changes have been made:  no change  Labs/ tests ordered today include:   No orders of the defined types were placed in this encounter.    Disposition:   FU with me in 6 months  Signed, Burns Timson [place], MD  07/22/2020 9:34 AM     [facility]  [place], [place], [place], [address] Phone [phone], Fax [phone]

## 2020-07-22 ENCOUNTER — Encounter: Payer: Self-pay | Admitting: Cardiology

## 2020-07-22 ENCOUNTER — Ambulatory Visit (INDEPENDENT_AMBULATORY_CARE_PROVIDER_SITE_OTHER): Payer: Medicare Other | Admitting: Cardiology

## 2020-07-22 ENCOUNTER — Other Ambulatory Visit: Payer: Self-pay

## 2020-07-22 VITALS — BP 140/72 | HR 65 | Ht 69.0 in | Wt 183.0 lb

## 2020-07-22 DIAGNOSIS — I480 Paroxysmal atrial fibrillation: Secondary | ICD-10-CM

## 2020-07-22 DIAGNOSIS — I340 Nonrheumatic mitral (valve) insufficiency: Secondary | ICD-10-CM | POA: Diagnosis not present

## 2020-07-22 DIAGNOSIS — Z952 Presence of prosthetic heart valve: Secondary | ICD-10-CM

## 2020-07-22 DIAGNOSIS — I25118 Atherosclerotic heart disease of native coronary artery with other forms of angina pectoris: Secondary | ICD-10-CM | POA: Diagnosis not present

## 2020-07-22 DIAGNOSIS — E7800 Pure hypercholesterolemia, unspecified: Secondary | ICD-10-CM

## 2020-07-22 DIAGNOSIS — I10 Essential (primary) hypertension: Secondary | ICD-10-CM

## 2020-08-10 ENCOUNTER — Other Ambulatory Visit: Payer: Self-pay | Admitting: Cardiology

## 2020-09-17 ENCOUNTER — Other Ambulatory Visit: Payer: Self-pay | Admitting: Cardiology

## 2020-09-29 ENCOUNTER — Other Ambulatory Visit: Payer: Self-pay | Admitting: Cardiology

## 2020-10-30 ENCOUNTER — Other Ambulatory Visit: Payer: Self-pay | Admitting: Cardiology

## 2020-10-30 NOTE — Telephone Encounter (Signed)
Refill sent 10/30/20 

## 2020-12-28 ENCOUNTER — Other Ambulatory Visit: Payer: Self-pay | Admitting: Cardiology

## 2021-01-26 NOTE — Progress Notes (Signed)
Cardiology Office Note:    Date:  01/27/2021   ID:  Ivan Mccoy, DOB 09-Dec-1935, MRN [id number]  PCP:  Merri Brunette, MD [facility] Cardiologist: Peter Swaziland, MD   Reason for visit: 6 month follow-up  History of Present Illness:    Ivan Mccoy is a 85 y.o. male with a hx of atrial fibrillation status post ablation in 2013, sleep apnea on CPAP, hypertension, hyperlipidemia, CAD status post DES to mid to distal left circumflex, mitral valve prolapse with severe regurgitation post mitral valve replacement with a #27 Carentier Edwards pericardial valve in 2017 with good results.  He last saw Dr. Swaziland in April 2022 and was doing well.  Today, patient is doing well.  He complains of feeling a little bit more tired as he is aging.  He does mention he went to [place] 2 weeks ago and walked over 10,000 steps per day.  With this, he had no chest pain or shortness of breath.  He is somewhat limited by a bad knee.  He states he does sleep well and is good about taking his medications.  He states he has been on relatively stable med regimen for a long time.  He denies palpitations, lower extremity edema, PND, orthopnea.  He states he would like to lose 5 pounds.  He does not check his blood pressure at home.     Past Medical History:  Diagnosis Date   Arrhythmia    paroxysmal afib   CHF (congestive heart failure) (HCC)    Coronary artery disease    Heart murmur    Hyperlipidemia    Hypertension    Macular degeneration    Obstructive sleep apnea     Past Surgical History:  Procedure Laterality Date   CARDIAC CATHETERIZATION N/A 08/31/2015   Procedure: Left Heart Cath and Coronary Angiography;  Surgeon: Peter M [place], MD;  Location: [facility] INVASIVE CV LAB;  Service: Cardiovascular;  Laterality: N/A;   CARDIAC CATHETERIZATION N/A 08/31/2015   Procedure: Coronary Stent Intervention;  Surgeon: Peter M [place], MD;  Location: [facility] INVASIVE CV LAB;  Service: Cardiovascular;  Laterality: N/A;    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    TONSILLECTOMY AND ADENOIDECTOMY      Current Medications: Current Meds  Medication Sig   amLODipine (NORVASC) 5 MG tablet TAKE 1 TABLET BY MOUTH DAILY   atorvastatin (LIPITOR) 40 MG tablet TAKE 1 TABLET BY MOUTH DAILY   carvedilol (COREG) 25 MG tablet TAKE ONE TABLET BY MOUTH TWICE DAILY   Cholecalciferol (VITAMIN D3) 25 MCG (1000 UT) CAPS Take 1 tablet by mouth 3 (three) times daily.   EPINEPHRINE 0.3 mg/0.3 mL IJ SOAJ injection INJECT INTO MUSCLE ONCE FOR ALLERGIC REACTION   eplerenone (INSPRA) 50 MG tablet TAKE 1 TABLET BY MOUTH DAILY   fenofibrate 160 MG tablet TAKE ONE (1) TABLET BY MOUTH EVERY DAY   furosemide (LASIX) 20 MG tablet TAKE ONE (1) TABLET BY MOUTH EVERY DAY   GLUCOSAMINE HCL PO Take 1 capsule by mouth 3 (three) times daily.   isosorbide mononitrate (IMDUR) 30 MG 24 hr tablet TAKE 1 TABLET BY MOUTH DAILY   losartan (COZAAR) 50 MG tablet TAKE ONE TABLET BY MOUTH TWICE DAILY   metroNIDAZOLE (METROCREAM) 0.75 % cream Apply 1 application topically every morning. APPLY TO FACE   Multiple Vitamins-Minerals (CENTRUM SILVER ADULT 50+ PO) Take 1 tablet by mouth daily  with breakfast.   Multiple Vitamins-Minerals (VITAMIN D3 COMPLETE) TABS See admin instructions.   potassium chloride (KLOR-CON) 20 MEQ packet 1 packet with food   potassium chloride SA (KLOR-CON) 20 MEQ tablet TAKE 1 TABLET BY MOUTH DAILY   Probiotic Product (PROBIOTIC DAILY) CAPS Take 1 capsule by mouth at bedtime.   Psyllium (METAMUCIL FIBER PO) Take 1 capsule by mouth every morning.   XARELTO 20 MG TABS tablet TAKE 1 TABLET BY MOUTH DAILY WITH SUPPER     Allergies:   Other, Peanut oil, Peanut-containing drug products, Peanut butter flavor, Sulfa antibiotics, Eliquis [apixaban], and Spironolactone   Social History   Socioeconomic History   Marital status: Married    Spouse  name: Not on file   Number of children: Not on file   Years of education: Not on file   Highest education level: Not on file  Occupational History   Not on file  Tobacco Use   Smoking status: Never   Smokeless tobacco: Never  Substance and Sexual Activity   Alcohol use: Not on file   Drug use: Not on file   Sexual activity: Not on file  Other Topics Concern   Not on file  Social History Narrative   Not on file   Social Determinants of Health   Financial Resource Strain: Not on file  Food Insecurity: Not on file  Transportation Needs: Not on file  Physical Activity: Not on file  Stress: Not on file  Social Connections: Not on file     Family History: The patient's family history includes Heart attack in his father.  ROS:   Please see the history of present illness.     EKGs/Labs/Other Studies Reviewed:    EKG:  The ekg ordered today demonstrates normal sinus rhythm, heart rate 62, QRS duration 108 ms.  No change from prior.  Recent Labs: No results found for requested labs within last 8760 hours.   Recent Lipid Panel No results found for: CHOL, TRIG, HDL, LDLCALC, LDLDIRECT  Physical Exam:    VS:  BP (!) 160/82   Pulse 62   Ht 5' 9.75" (1.772 m)   Wt 188 lb 12.8 oz (85.6 kg)   SpO2 96%   BMI 27.28 kg/m    No data found.  Wt Readings from Last 3 Encounters:  01/27/21 188 lb 12.8 oz (85.6 kg)  07/22/20 183 lb (83 kg)  01/21/20 190 lb 3.2 oz (86.3 kg)     GEN:  Well nourished, well developed in no acute distress, looks younger than stated age. HEENT: Normal NECK: No JVD; No carotid bruits CARDIAC: RRR, no murmurs, rubs, gallops RESPIRATORY:  Clear to auscultation without rales, wheezing or rhonchi  ABDOMEN: Soft, non-tender, non-distended MUSCULOSKELETAL: trace edema below the shins bilaterally; No deformity  SKIN: Warm and dry NEUROLOGIC:  Alert and oriented PSYCHIATRIC:  Normal affect    ASSESSMENT AND PLAN   CAD, no angina -NSTEMI in setting  of CAP in May 2017. S/p DES of mid to distal LCx. Branch vessel disease noted.  -Continue antianginal therapy with nitrates, Coreg, amlodipine.  -No antiplatelet therapy since on Xarelto.    Paroxysmal Afib, in NSR today -No recurrence documented since his valve surgery in 2017.  With CHA2DS2-VASc of 5, Dr. Swaziland has recommended long-term Xarelto unless issues with bleeding.     History of MVP with severe MR. S/p MV replacement in 2017 with pericardial valve.  -SBE prophylaxis. -Order echo as we are at the 5-year mark post implant.  Repeat again at the 10-year mark and then annually thereafter per 2020 ACC/AHA valve guidelines.  Chronic diastolic heart failure, euvolemic today -Continue Lasix, compression stockings and salt restriction.   Hypertension, BP elevated. -Blood pressure this visit 160/82, repeat 152/72.  Recommend 2-week blood pressure log.  If systolic blood pressure over 140 recurrently, recommend increase amlodipine to 10 mg daily.   -Goal BP is <130/80.  Recommend DASH diet (high in vegetables, fruits, low-fat dairy products, whole grains, poultry, fish, and nuts and low in sweets, sugar-sweetened beverages, and red meats), salt restriction and increase physical activity.  Hyperlipidemia -LDL 42 in February 2022.  Continue statin therapy. -Discussed cholesterol lowering diets - Mediterranean diet, DASH diet, vegetarian diet, low-carbohydrate diet and avoidance of trans fats.  Discussed healthier choice substitutes.  Nuts, high-fiber foods, and fiber supplements may also improve lipids.    Disposition - Follow-up in 3 months to follow-up on blood pressure.       Medication Adjustments/Labs and Tests Ordered: Current medicines are reviewed at length with the patient today.  Concerns regarding medicines are outlined above.  Orders Placed This Encounter  Procedures   EKG 12-Lead   ECHOCARDIOGRAM COMPLETE    No orders of the defined types were placed in this  encounter.   Patient Instructions  Medication Instructions:  No Changes *If you need a refill on your cardiac medications before your next appointment, please call your pharmacy*   Lab Work: No Labs If you have labs (blood work) drawn today and your tests are completely normal, you will receive your results only by: MyChart Message (if you have MyChart) OR A paper copy in the mail If you have any lab test that is abnormal or we need to change your treatment, we will call you to review the results.   Testing/Procedures: [address] Your physician has requested that you have an echocardiogram. Echocardiography is a painless test that uses sound waves to create images of your heart. It provides your doctor with information about the size and shape of your heart and how well your heart's chambers and valves are working. This procedure takes approximately one hour. There are no restrictions for this procedure.    Follow-Up: At [facility], you and your health needs are our priority.  As part of our continuing mission to provide you with exceptional heart care, we have created designated Provider Care Teams.  These Care Teams include your primary Cardiologist (physician) and Advanced Practice Providers (APPs -  Physician Assistants and Nurse Practitioners) who all work together to provide you with the care you need, when you need it.  We recommend signing up for the patient portal called "MyChart".  Sign up information is provided on this After Visit Summary.  MyChart is used to connect with patients for Virtual Visits (Telemedicine).  Patients are able to view lab/test results, encounter notes, upcoming appointments, etc.  Non-urgent messages can be sent to your provider as well.   To learn more about what you can do with MyChart, go to [email or web address].    Your next appointment:   3 month(s)  The format for your next appointment:   In Person  Provider:    Peter Swaziland, MD   Other Instructions     Signed, Cannon Kettle, PA-C  01/27/2021 12:08 PM    [facility]

## 2021-01-27 ENCOUNTER — Other Ambulatory Visit: Payer: Self-pay

## 2021-01-27 ENCOUNTER — Ambulatory Visit (INDEPENDENT_AMBULATORY_CARE_PROVIDER_SITE_OTHER): Payer: Medicare Other | Admitting: Physician Assistant

## 2021-01-27 ENCOUNTER — Encounter: Payer: Self-pay | Admitting: Physician Assistant

## 2021-01-27 VITALS — BP 160/82 | HR 62 | Ht 69.75 in | Wt 188.8 lb

## 2021-01-27 DIAGNOSIS — Z952 Presence of prosthetic heart valve: Secondary | ICD-10-CM | POA: Diagnosis not present

## 2021-01-27 DIAGNOSIS — E7800 Pure hypercholesterolemia, unspecified: Secondary | ICD-10-CM

## 2021-01-27 DIAGNOSIS — I25118 Atherosclerotic heart disease of native coronary artery with other forms of angina pectoris: Secondary | ICD-10-CM

## 2021-01-27 DIAGNOSIS — I340 Nonrheumatic mitral (valve) insufficiency: Secondary | ICD-10-CM | POA: Diagnosis not present

## 2021-01-27 DIAGNOSIS — I480 Paroxysmal atrial fibrillation: Secondary | ICD-10-CM | POA: Diagnosis not present

## 2021-01-27 DIAGNOSIS — I10 Essential (primary) hypertension: Secondary | ICD-10-CM

## 2021-01-27 NOTE — Patient Instructions (Addendum)
Medication Instructions:  No Changes *If you need a refill on your cardiac medications before your next appointment, please call your pharmacy*   Lab Work: No Labs If you have labs (blood work) drawn today and your tests are completely normal, you will receive your results only by: MyChart Message (if you have MyChart) OR A paper copy in the mail If you have any lab test that is abnormal or we need to change your treatment, we will call you to review the results.   Testing/Procedures: [address] Your physician has requested that you have an echocardiogram. Echocardiography is a painless test that uses sound waves to create images of your heart. It provides your doctor with information about the size and shape of your heart and how well your heart's chambers and valves are working. This procedure takes approximately one hour. There are no restrictions for this procedure.    Follow-Up: At [facility], you and your health needs are our priority.  As part of our continuing mission to provide you with exceptional heart care, we have created designated Provider Care Teams.  These Care Teams include your primary Cardiologist (physician) and Advanced Practice Providers (APPs -  Physician Assistants and Nurse Practitioners) who all work together to provide you with the care you need, when you need it.  We recommend signing up for the patient portal called "MyChart".  Sign up information is provided on this After Visit Summary.  MyChart is used to connect with patients for Virtual Visits (Telemedicine).  Patients are able to view lab/test results, encounter notes, upcoming appointments, etc.  Non-urgent messages can be sent to your provider as well.   To learn more about what you can do with MyChart, go to [email or web address].    Your next appointment:   3 month(s)  The format for your next appointment:   In Person  Provider:   Peter Swaziland, MD   Other  Instructions

## 2021-02-02 NOTE — Telephone Encounter (Signed)
Received B/P readings 166/78,138/70,164/82,184/88,114/62,174/72,132/70,172/78,144/74,162/74,158/74,188/88,158/76.Pulse [id number]. Spoke to patient medication reviewed.Med list is correct.He is concerned B/P medication does not last.Stated last night before meds B/P 182/84.After meds 134/68.Stated this morning when he woke up before meds B/P 198/88.After meds 144/84.Advised I will send message to Dr.Jordan for advice.

## 2021-02-03 NOTE — Telephone Encounter (Signed)
I would recommend increasing amlodipine to 5 mg bid. Restrict salt intake.   Aivah Putman Sw[place] MD, [facility]

## 2021-02-04 NOTE — Telephone Encounter (Signed)
Spoke to patient 10/26 Dr.Jordan's advice given.He will increase Amlodipine to 5 mg twice a day.He will continue to monitor B/P and send readings in 2 weeks.

## 2021-02-08 ENCOUNTER — Telehealth: Payer: Self-pay | Admitting: Cardiology

## 2021-02-08 NOTE — Telephone Encounter (Signed)
Spoke to patient advised he just increased Amlodipine 5 mg twice a day on Thursday 10/27.Advised has not been long enough to notice a difference.Dr.Jordan is out of office this week.I will send message to him for advice.

## 2021-02-08 NOTE — Telephone Encounter (Signed)
Pt c/o BP issue: STAT if pt c/o blurred vision, one-sided weakness or slurred speech  1. What are your last 5 BP readings?   02/01/21  10:00 pm: 182/84  9:12 pm:   134/68 02/02/21 7:00 am: 198/88 9:00 am: 144/84 6:10 pm: 192/84 7:50 pm: 156/76 10:05 pm: 164/78  02/03/21  11:10 am: 158/76 5:00 pm: 178/84 10:20 pm: 164/82 02/04/21  7:25 am: 128/68  6:45 pm: 176/84  9:10 pm: 158/80 02/05/21 10:20 am: 184/88 12:20 pm: 174/84 7:00 pm: 164/82 02/06/21  11:05 am: 128/64  7:00 pm: 158/80 02/07/21  11:50 am: 158/78  7:30 pm: 176/80  10:15 pm: 190/86    2. Are you having any other symptoms (ex. Dizziness, headache, blurred vision, passed out)?   3. What is your BP issue? Patient feels that sometimes his BP medicine may not last as long as it is supposed to.  Patient saw Dr. Elvis Coil PA Juanda Crumble and his BP medication was changed. The Patient was advised to continue to take his BP and report them back to the office. Patient takes his medication then waits an hour or two to take his BP.

## 2021-02-09 NOTE — Telephone Encounter (Signed)
Yes continue to monitor for at least 2 weeks.  Shaquetta Arcos [place] MD, [facility]

## 2021-02-12 ENCOUNTER — Ambulatory Visit (HOSPITAL_COMMUNITY): Payer: Medicare Other | Attending: Internal Medicine

## 2021-02-12 ENCOUNTER — Other Ambulatory Visit: Payer: Self-pay

## 2021-02-12 DIAGNOSIS — I25118 Atherosclerotic heart disease of native coronary artery with other forms of angina pectoris: Secondary | ICD-10-CM | POA: Diagnosis present

## 2021-02-12 DIAGNOSIS — Z952 Presence of prosthetic heart valve: Secondary | ICD-10-CM

## 2021-02-12 DIAGNOSIS — E7800 Pure hypercholesterolemia, unspecified: Secondary | ICD-10-CM

## 2021-02-12 DIAGNOSIS — I480 Paroxysmal atrial fibrillation: Secondary | ICD-10-CM | POA: Diagnosis present

## 2021-02-12 DIAGNOSIS — I340 Nonrheumatic mitral (valve) insufficiency: Secondary | ICD-10-CM | POA: Diagnosis present

## 2021-02-12 DIAGNOSIS — I10 Essential (primary) hypertension: Secondary | ICD-10-CM

## 2021-02-12 LAB — ECHOCARDIOGRAM COMPLETE
Area-P 1/2: 3 cm2
MV VTI: 1.42 cm2
P 1/2 time: 523 msec
S' Lateral: 3.05 cm

## 2021-02-16 ENCOUNTER — Other Ambulatory Visit: Payer: Self-pay | Admitting: Urology

## 2021-02-16 DIAGNOSIS — R972 Elevated prostate specific antigen [PSA]: Secondary | ICD-10-CM

## 2021-02-18 ENCOUNTER — Telehealth: Payer: Self-pay

## 2021-02-18 NOTE — Progress Notes (Signed)
Agree, thanks Dr. [place]!  We will let patient know.

## 2021-02-18 NOTE — Telephone Encounter (Addendum)
Called patient regarding results. Left message.----- Message from Cannon Kettle, PA-C sent at 02/18/2021 12:02 PM EST ----- Per Dr. [place], "Patient has only mild to moderate MR and while there is some mismatch this is not severe. I would just observe and probably repeat Echo in 2 years unless he becomes more symptomatic." --> With mild to moderate mitral regurgitation, recommend repeating echo every 2 years unless symptoms of shortness of breath or swelling.

## 2021-02-19 ENCOUNTER — Telehealth: Payer: Self-pay | Admitting: Cardiology

## 2021-02-19 NOTE — Telephone Encounter (Signed)
This encounter was created in error - please disregard.

## 2021-02-19 NOTE — Telephone Encounter (Signed)
Spoke to patient Dr.Jordan advised since your B/P is elevated in mornings.Take Amlodipine 10 mg every night.Advised continue to monitor and call back if elevated.

## 2021-02-19 NOTE — Progress Notes (Signed)
error 

## 2021-02-19 NOTE — Telephone Encounter (Signed)
Patient states he is returning Cheryl's call.  °

## 2021-02-26 NOTE — Telephone Encounter (Signed)
Spoke to patient he stated he has been taking Amlodipine 10 mg every night for the past 4 nights.Stated he has not noticed a difference in B/P yet.Stated continues to be elevated in mornings.Stated he will continue to monitor and call back the end of month to report readings.

## 2021-03-10 ENCOUNTER — Telehealth: Payer: Self-pay

## 2021-03-10 ENCOUNTER — Other Ambulatory Visit: Payer: Self-pay | Admitting: Cardiology

## 2021-03-10 MED ORDER — AMLODIPINE BESYLATE 10 MG PO TABS: 10.0000 mg | ORAL_TABLET | Freq: Every day | ORAL | 3 refills | Status: DC

## 2021-03-10 MED ORDER — VALSARTAN 320 MG PO TABS: 320.0000 mg | ORAL_TABLET | Freq: Every day | ORAL | 6 refills | Status: DC

## 2021-03-10 NOTE — Telephone Encounter (Signed)
Spoke to patient Dr.Jordan reviewed B/P readings.He advised to stop Losartan and start Valsartan 320 mg daily. Continue all other medications.Advised to continue to monitor B/P and bring readings to appointment with Dr.Jordan 12/12 at 2:30 pm.

## 2021-03-15 ENCOUNTER — Ambulatory Visit
Admission: RE | Admit: 2021-03-15 | Discharge: 2021-03-15 | Disposition: A | Discharge status: Home / Self Care | Payer: Medicare Other | Source: Ambulatory Visit | Attending: Urology | Admitting: Urology

## 2021-03-15 ENCOUNTER — Other Ambulatory Visit: Payer: Self-pay

## 2021-03-15 DIAGNOSIS — R972 Elevated prostate specific antigen [PSA]: Secondary | ICD-10-CM

## 2021-03-15 IMAGING — MR MR PROSTATE WO/W CM
12 series · 48 of 48 positions shown · IV contrast (multihance)
Comparison: MRI [DATE]

CLINICAL DATA: 85-year-old male with elevated PSA equal 4.3. No
biopsy.

EXAM:
MR PROSTATE WITHOUT AND WITH CONTRAST
TECHNIQUE: Multiplanar multisequence MRI images were obtained of the pelvis
centered about the prostate. Pre and post contrast images were
obtained.
CONTRAST:  15mL MULTIHANCE GADOBENATE DIMEGLUMINE 529 MG/ML IV SOLN

[Series 3: T2 · coronal · 3.0mm · 0.56mm/px · 1 of 23 slices shown (1 of 3)]
[im 1/23]
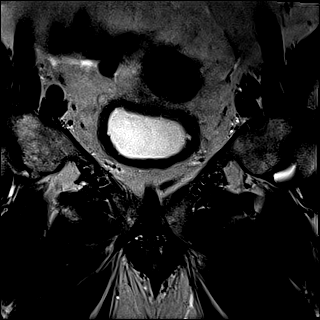

[Series 4: T1 · axial · 5.0mm · 1.25mm/px · 1 of 80 slices shown]
[im 1/80]
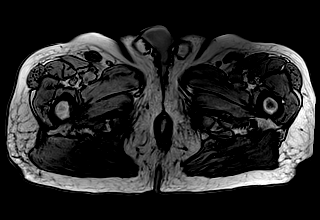

[Series 5: DWI · axial · 3.0mm · 1.75mm/px · z∈[-21,+42]mm · 2 of 66 slices shown (1 of 3)]
[im 1/66]
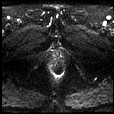
[im 66/66]
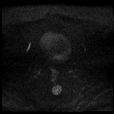

[Series 6: DWI · axial · 3.0mm · 1.75mm/px · 1 of 22 slices shown (2 of 3)]
[im 1/22]
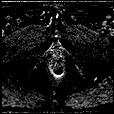

[Series 7: DWI · axial · 3.0mm · 1.75mm/px · 1 of 22 slices shown (3 of 3)]
[im 1/22]
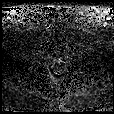

[Series 8: T2 · axial · 3.0mm · 0.56mm/px · 1 of 23 slices shown (2 of 3)]
[im 1/23]
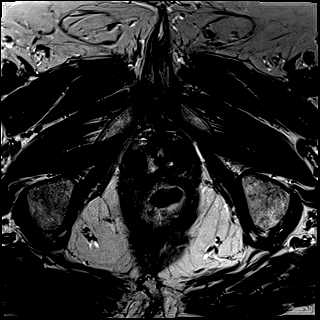

[Series 9: T2 · axial · 1.0mm · 1.04mm/px · z∈[-25,+46]mm · 2 of 72 slices shown (3 of 3)]
[im 1/72]
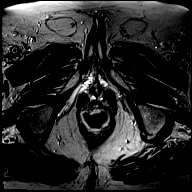
[im 72/72]
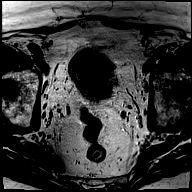

[Series 10: pre t1_twist_tra_dyn · axial · non-contrast · 3.5mm · 0.83mm/px · 1 of 20 slices shown]
[im 1/20]
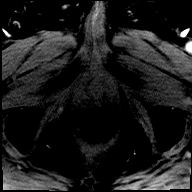

[Series 11: post t1_twist_tra_dyn-copy center · axial · non-contrast · 3.5mm · 0.83mm/px · z∈[-23,+43]mm · 17 of 600 slices shown]
[im 1/600]
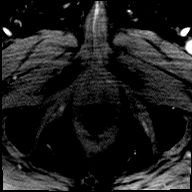
[im 38/600]
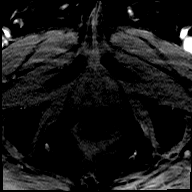
[im 75/600]
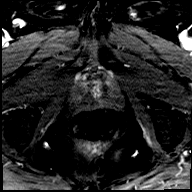
[im 113/600]
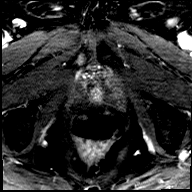
[im 150/600]
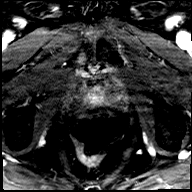
[im 188/600]
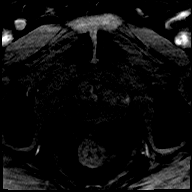
[im 225/600]
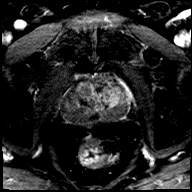
[im 263/600]
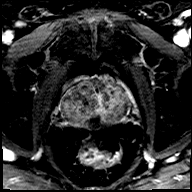
[im 300/600]
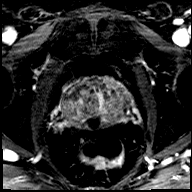
[im 337/600]
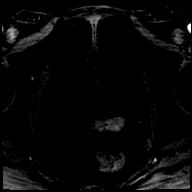
[im 375/600]
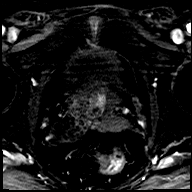
[im 412/600]
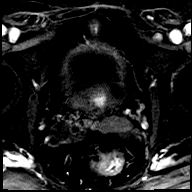
[im 450/600]
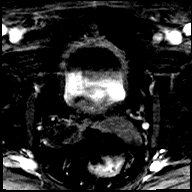
[im 487/600]
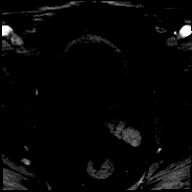
[im 525/600]
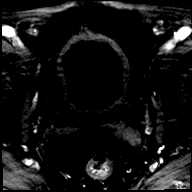
[im 562/600]
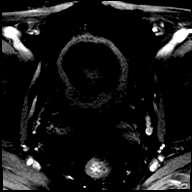
[im 600/600]
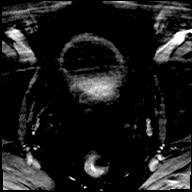

[Series 12: post t1_twist_tra_dyn-copy cent_sub · axial · 3.5mm · 0.83mm/px · z∈[-23,+43]mm · 17 of 579 slices shown]
[im 1/579]
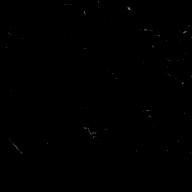
[im 37/579]
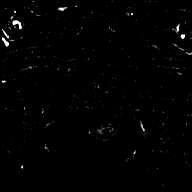
[im 73/579]
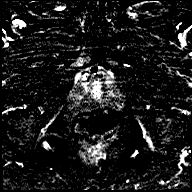
[im 109/579]
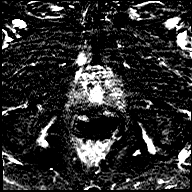
[im 145/579]
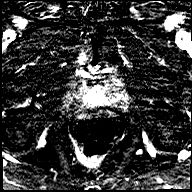
[im 181/579]
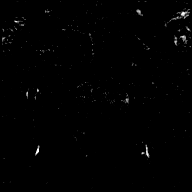
[im 217/579]
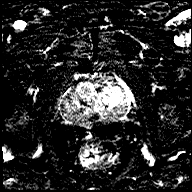
[im 253/579]
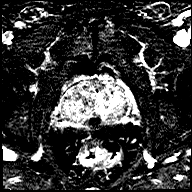
[im 290/579]
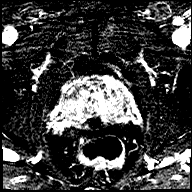
[im 326/579]
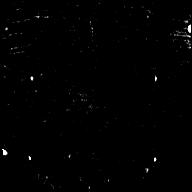
[im 362/579]
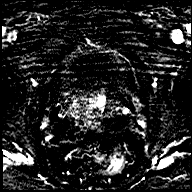
[im 398/579]
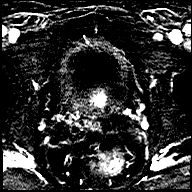
[im 434/579]
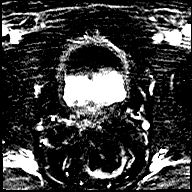
[im 470/579]
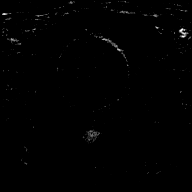
[im 506/579]
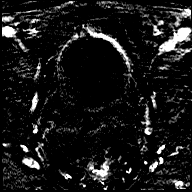
[im 542/579]
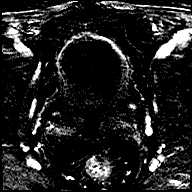
[im 579/579]
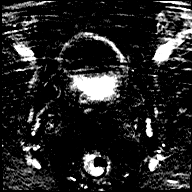

[Series 13: t1_vibe_dixon_tra_f · axial · 2.5mm · 0.91mm/px · z∈[-52,+145]mm · 2 of 80 slices shown]
[im 1/80]
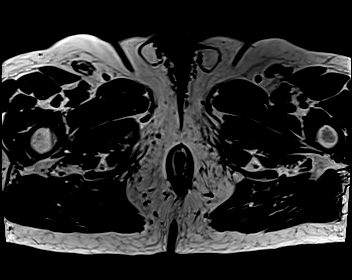
[im 80/80]
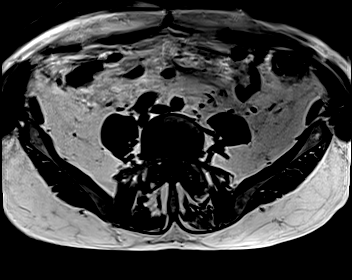

[Series 14: t1_vibe_dixon_tra_w · axial · 2.5mm · 0.91mm/px · z∈[-52,+145]mm · 2 of 80 slices shown]
[im 1/80]
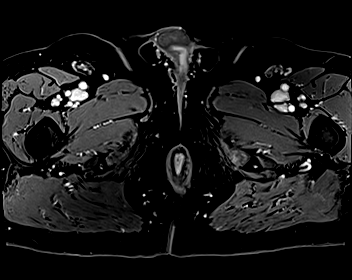
[im 80/80]
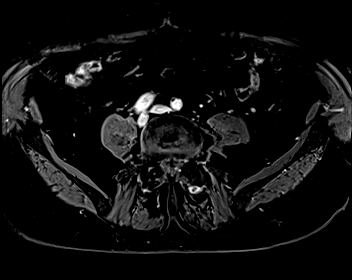

[48 of 48 positions shown; findings below may reference images not displayed]

FINDINGS: Prostate: Ovoid focus of restricted diffusion in the RIGHT lateral
mid gland at the junction of the peripheral zone and transitional
zone. Lesion measures 10 mm x 7 mm on image 13/series 5. Mild to
moderate restricted diffusion.

There is poorly defined low signal intensity at this location on the
high T2 weighted imaging (image 43/series 9).

Elsewhere there is no evidence of restricted diffusion within the
peripheral zone.

Scattered linear striations within the peripheral zone on T2
weighted imaging favored benign.

The transitional zone is enlarged by a capsulated nodules with
heterogeneous signal intensity on T2 weighted imaging which is
typical benign prostate nodules.

Volume: 6.1 x 4.1 by 4.5 cm (volume = 59 cm^3)

Transcapsular spread:  Present/absent/other

Seminal vesicle involvement: There is high signal intensity within
the LEFT seminal vesicle on precontrast T1 weighted imaging
consistent blood product. No suspicious imaging characteristics
otherwise.

Neurovascular bundle involvement: Absent

Pelvic adenopathy: LEFT external iliac lymph node measures 4 mm
short axis (image 26/13).

Bone metastasis: Absent

Other findings: None
IMPRESSION: 1. Focus of restricted diffusion in the RIGHT lateral mid zone is
concerning for high-grade carcinoma. PI-RADS: 4. (Dynacad 3D post
processing performed). OLOGO #1.
2. Enlarged nodular transitional zone most consistent with benign
prostate hypertrophy. PI-RADS: 2
3. Blood product within the LEFT seminal vesicle. No evidence of
malignant involvement.

## 2021-03-15 MED ORDER — GADOBENATE DIMEGLUMINE 529 MG/ML IV SOLN
15.0000 mL | Freq: Once | INTRAVENOUS | Status: AC | PRN
  Administered 2021-03-15: 15 mL via INTRAVENOUS

## 2021-03-19 NOTE — Progress Notes (Signed)
Cardiology Office Note   Date:  03/22/2021   ID:  Ivan Mccoy, DOB 30-Jul-1935, MRN [id number]  PCP:  Merri Brunette, MD  Cardiologist:   Shalie Schremp [place], MD   No chief complaint on file.     History of Present Illness: Ivan Mccoy is a 85 y.o. male who is seen at the request of Dr  Melvyn Neth for evaluation of CAD and paroxysmal Afib.  He lives at [facility]. He has a history of atrial fibrillation status post ablation in September 2013.  He also has a history of sleep apnea and is compliant with CPAP therapy. Additional past medical history is significant for hypertension and hyperlipidemia. He had a Cardiolite exercise tolerance test 05/06/2011 which was negative for ischemia. Echocardiogram also in January 2013 showed normal left ventricular systolic function with an estimated ejection fraction of 50-55%. There was moderate left ventricular hypertrophy with diastolic dysfunction. The left atrium and right atrium were mildly enlarged. Moderate tricuspid regurgitation and mild pulmonary hypertension was noted at that time.  In May 2017 he presented here with complaints of dyspnea, cough, subjective fever and chills. His CXR showed evidence of bilateral pneumonia and potentially superimposed mild interstitial edema. BNP was abnormal at 1070. Troponin was elevated at 1.15. Ecg showed T wave inversions in V5-6. He was treated for CAP. He was noted to have some episodes of Afib. Echo showed normal EF with mild basal septal hypertrophy. Mild MR and TR. Mild pulmonary HTN. He did undergo left heart cath which revealed 3 vessel obstructive CAD with predominant branch vessel disease. This includes the first and second diagonal, PLOM and RV marginal branches of the RCA. The LCx lesion appeared to be the culprit vessel and was treated w/ DES to mid distal LCx. He was DC on Plavix and Xarelto.   On subsequent follow up with his cardiologist in [place] he was noted to have a more significant murmur and  Echo showed anterior MV prolapse with moderate MR. TEE showed severe prolapse of the A2 scallop of the anterior leaflet with severe MR. He also had severe pulmonary HTN. Event monitor in 2017 showed NSR with PACs. No Afib. He was referred to [facility] and seen by Dr Silvestre Mesi. On 12/21/15 he had attempt at MV repair but ultimately required MV replacement with a #27 Carentier Edwards pericardial valve. Echo on November 03, 2017 showed EF 50% with moderate LVH. Moderate LAE. Normally functioning MV prosthesis. Mean gradient 3 mm Hg. Trivial MR. Mild AI and TR.   He was seen in October. BP was high. Amlodipine was increased so amlodipine increased to 10 mg daily. BP still high so we switched losartan to valsartan 320 mg daily. Echo was repeated in October- results noted below.  Normal LV function, MV gradient of 3-4 mm Hg. Mild to moderate MR. Normal RV pressures. On follow up today he is doing well. Denies any chest pain or SOB. Does feel tired easily. Has a nagging cough over the past 2 weeks. Brings BP readings - typically in 140-150 range with diastolic of 70.     Past Medical History:  Diagnosis Date   Arrhythmia    paroxysmal afib   CHF (congestive heart failure) (HCC)    Coronary artery disease    Heart murmur    Hyperlipidemia    Hypertension    Macular degeneration    Obstructive sleep apnea     Past Surgical History:  Procedure Laterality Date   CARDIAC CATHETERIZATION N/A 08/31/2015   Procedure:  Left Heart Cath and Coronary Angiography;  Surgeon: Jeniel Slauson M [place], MD;  Location: [place] [facility];  Service: Cardiovascular;  Laterality: N/A;   CARDIAC CATHETERIZATION N/A 08/31/2015   Procedure: Coronary Stent Intervention;  Surgeon: Abdulai Blaylock M [place], MD;  Location: [place] [facility];  Service: Cardiovascular;  Laterality: N/A;   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    TONSILLECTOMY AND ADENOIDECTOMY        Current Outpatient Medications  Medication Sig Dispense Refill   amLODipine (NORVASC) 10 MG tablet Take 1 tablet (10 mg total) by mouth daily. 90 tablet 3   atorvastatin (LIPITOR) 40 MG tablet TAKE 1 TABLET BY MOUTH DAILY 90 tablet 3   carvedilol (COREG) 25 MG tablet TAKE ONE TABLET BY MOUTH TWICE DAILY 180 tablet 2   Cholecalciferol (VITAMIN D3) 25 MCG (1000 UT) CAPS Take 1 tablet by mouth 3 (three) times daily.     EPINEPHRINE 0.3 mg/0.3 mL IJ SOAJ injection INJECT INTO MUSCLE ONCE FOR ALLERGIC REACTION 2 each 2   eplerenone (INSPRA) 50 MG tablet TAKE 1 TABLET BY MOUTH DAILY 90 tablet 3   fenofibrate 160 MG tablet TAKE ONE (1) TABLET BY MOUTH EVERY DAY 90 tablet 2   furosemide (LASIX) 20 MG tablet TAKE ONE (1) TABLET BY MOUTH EVERY DAY 90 tablet 1   GLUCOSAMINE HCL PO Take 1 capsule by mouth 3 (three) times daily.     isosorbide mononitrate (IMDUR) 30 MG 24 hr tablet TAKE 1 TABLET BY MOUTH DAILY 90 tablet 3   metroNIDAZOLE (METROCREAM) 0.75 % cream Apply 1 application topically every morning. APPLY TO FACE     Multiple Vitamins-Minerals (CENTRUM SILVER ADULT 50+ PO) Take 1 tablet by mouth daily with breakfast.     Multiple Vitamins-Minerals (VITAMIN D3 COMPLETE) TABS See admin instructions.     potassium chloride (KLOR-CON) 20 MEQ packet 1 packet with food     potassium chloride SA (KLOR-CON) 20 MEQ tablet TAKE 1 TABLET BY MOUTH DAILY 90 tablet 1   Probiotic Product (PROBIOTIC DAILY) CAPS Take 1 capsule by mouth at bedtime.     Psyllium (METAMUCIL FIBER PO) Take 1 capsule by mouth every morning.     XARELTO 20 MG TABS tablet TAKE 1 TABLET BY MOUTH DAILY WITH SUPPER 90 tablet 3   valsartan (DIOVAN) 320 MG tablet Take 1 tablet (320 mg total) by mouth daily. 90 tablet 3   No current facility-administered medications for this visit.    Allergies:   Other, Peanut oil, Peanut-containing drug products, Peanut butter flavor, Sulfa antibiotics, Eliquis [apixaban], and Spironolactone     Social History:  The patient  reports that he has never smoked. He has never used smokeless tobacco.   Family History:  The patient's family history includes Heart attack in his father.    ROS:  Please see the history of present illness.   Otherwise, review of systems are positive for none.   All other systems are reviewed and negative.    PHYSICAL EXAM: VS:  BP (!) 138/56   Pulse 62   Ht 5' 9.5" (1.765 m)   Wt 189 lb (85.7 kg)   SpO2 98%   BMI 27.51 kg/m  , BMI Body mass index is 27.51 kg/m. GEN: Well nourished, well developed, in no acute distress  HEENT: normal  Neck: no JVD, carotid bruits, or masses Cardiac: RRR; soft 1/6 systolic murmur LSB/apex,  no rubs, or gallops,no edema  Respiratory:  clear to auscultation bilaterally, normal work of breathing GI: soft, nontender, nondistended, + BS MS: no deformity or atrophy  Skin: warm and dry, no rash Neuro:  Strength and sensation are intact Psych: euthymic mood, full affect   EKG:  EKG is not ordered today.    Recent Labs: No results found for requested labs within last 8760 hours.    Lipid Panel No results found for: CHOL, TRIG, HDL, CHOLHDL, VLDL, LDLCALC, LDLDIRECT   Dated 01/15/19: LDL 43, triglycerides 81, TSH normal.  Dated 05/22/20: cholesterol 93, triglycerides 86, HDL 34, LDL 42. GFR 59.   Cardiac Catheterization: 08/31/2015    Dist RCA lesion, 100% stenosed. RPDA lesion, 40% stenosed. Acute Mrg lesion, 99% stenosed. Ost 1st Diag lesion, 80% stenosed. Ost 2nd Diag to 2nd Diag lesion, 90% stenosed. There is mild left ventricular systolic dysfunction. Prox Cx to Mid Cx lesion, 99% stenosed. Post intervention, there is a 0% residual stenosis.   1. 3 vessel obstructive CAD with predominant branch vessel disease. This includes the first and second diagonal, PLOM and RV marginal branches of the RCA. The LCx lesion appears to be the culprit vessel 2. Mild LV dysfunction with elevated LV EDP 3. Successful  stenting of the mid-distal LCx with DES.    Plan: DAPT with ASA and Plavix for one month then discontinue ASA. Continue Plavix for at least one year. Plan to resume Pradaxa tomorrow. Patient needs additional IV diuresis. Continue beta blocker and add oral nitrate.   Echo 08/25/15:Study Conclusions   - Procedure narrative: Transthoracic echocardiography. Image   quality was adequate. The study was technically difficult. - Left ventricle: The cavity size was normal. There was mild focal   basal hypertrophy of the septum. Systolic function was normal.   Wall motion was normal; there were no regional wall motion   abnormalities. The study is not technically sufficient to allow   evaluation of LV diastolic function. - Aortic valve: Transvalvular velocity was within the normal range.   There was no stenosis. There was mild regurgitation. - Mitral valve: Calcified annulus. There was mild regurgitation. - Left atrium: The atrium was moderately dilated. - Right ventricle: The cavity size was normal. Wall thickness was   normal. Systolic function was normal. - Tricuspid valve: There was mild regurgitation. - Pulmonary arteries: Systolic pressure was moderately increased.   PA peak pressure: 49 mm Hg (S). - Inferior vena cava: The vessel was normal in size. The   respirophasic diameter changes were blunted (< 50%), consistent   with normal central venous pressure.  Echo 01/12/21: IMPRESSIONS     1. The mitral valve has been replaced with a 27 mm Carpenteir Edwards  Pericardial Valve.   2. Prothestic valve parameters: Pressure Halte time 106 ms, TVI 3, EOA  1.4 cm2, mean gradient 3. There is evidence of some patient prosthesis  mismatch without significant mitral gradients and stenosis. There is  evidence of some degree of pathologic  regurgitation not well visualized by color Doppler. In the setting of  multiple valve disease (aortic, pulmonic, mitral), consider TEE for  further assessment,  or earlier screening TTE follow up.   3. Pulmonic valve regurgitation is moderate.   4. Left ventricular ejection fraction, by estimation, is 55 to 60%. Left  ventricular ejection fraction by 3D volume is 55 %. The left ventricle has  normal function. The left ventricle has no regional wall motion  abnormalities. Left ventricular diastolic   parameters  are indeterminate.   5. Right ventricular systolic function is normal. The right ventricular  size is normal. There is normal pulmonary artery systolic pressure.   6. Left atrial size was moderately dilated.   7. The aortic valve was not well visualized. Aortic valve regurgitation  is mild. No aortic stenosis is present.   Comparison(s): A prior study was performed on 08/25/2015. New mitral valve  prosthesis.   ASSESSMENT AND PLAN:  1.  CAD. S/p  NSTEMI in setting of CAP in May 2017. S/p DES of mid to distal LCx. Branch vessel disease as noted. Continue antianginal therapy with nitrates, Coreg, amlodipine. No antiplatelet therapy since on Xarelto. He is  asymptomatic.   2. Paroxysmal Afib. No recurrence documented since his valve surgery in 2017. He is at higher risk for recurrence. [place] Vasc score of 4. I would advise to continue Xarelto unless he was to have problems with bleeding.   3. History of MVP with severe MR. S/p MV replacement in 2017 with pericardial valve. Echo in October Ok. Some MR mild to moderate.  SBE prophylaxis.   4. Hypercholesterolemia. Excellent control.   5. HTN- BP has been difficult to control. On high doses of multiple medications. Overall I am satisfied with current level of control .   6. Chronic diastolic CHF. Well compensated.   7. OSA on CPAP   Current medicines are reviewed at length with the patient today.  The patient does not have concerns regarding medicines.  The following changes have been made:  no change  Labs/ tests ordered today include:   No orders of the defined types were placed in  this encounter.    Disposition:   FU with me in 6 months  Signed, Saleen Peden [place], MD  03/22/2021 3:03 PM    [facility] [address] [place], [place], [address] Phone [phone], Fax [phone]

## 2021-03-22 ENCOUNTER — Other Ambulatory Visit: Payer: Self-pay

## 2021-03-22 ENCOUNTER — Ambulatory Visit (INDEPENDENT_AMBULATORY_CARE_PROVIDER_SITE_OTHER): Payer: Medicare Other | Admitting: Cardiology

## 2021-03-22 ENCOUNTER — Encounter: Payer: Self-pay | Admitting: Cardiology

## 2021-03-22 VITALS — BP 138/56 | HR 62 | Ht 69.5 in | Wt 189.0 lb

## 2021-03-22 DIAGNOSIS — I10 Essential (primary) hypertension: Secondary | ICD-10-CM

## 2021-03-22 DIAGNOSIS — I480 Paroxysmal atrial fibrillation: Secondary | ICD-10-CM | POA: Diagnosis not present

## 2021-03-22 DIAGNOSIS — I340 Nonrheumatic mitral (valve) insufficiency: Secondary | ICD-10-CM | POA: Diagnosis not present

## 2021-03-22 DIAGNOSIS — E7800 Pure hypercholesterolemia, unspecified: Secondary | ICD-10-CM

## 2021-03-22 DIAGNOSIS — I25118 Atherosclerotic heart disease of native coronary artery with other forms of angina pectoris: Secondary | ICD-10-CM | POA: Diagnosis not present

## 2021-03-22 DIAGNOSIS — Z952 Presence of prosthetic heart valve: Secondary | ICD-10-CM | POA: Diagnosis not present

## 2021-03-22 MED ORDER — VALSARTAN 320 MG PO TABS: 320.0000 mg | ORAL_TABLET | Freq: Every day | ORAL | 3 refills | Status: DC

## 2021-05-04 ENCOUNTER — Ambulatory Visit: Payer: Medicare Other | Admitting: Cardiology

## 2021-06-30 ENCOUNTER — Other Ambulatory Visit: Payer: Self-pay | Admitting: Cardiology

## 2021-07-01 NOTE — Telephone Encounter (Signed)
Prescription refill request for Xarelto received.  ?Indication:Afib ?Last office visit:12/22 ?Weight:85.7 kg ?Age:86 ?Scr:0.8 ?CrCl:84.81 ml/min ? ?Prescription refilled ? ?

## 2021-08-16 ENCOUNTER — Other Ambulatory Visit: Payer: Self-pay | Admitting: Cardiology

## 2021-08-18 ENCOUNTER — Other Ambulatory Visit: Payer: Self-pay | Admitting: Cardiology

## 2021-09-16 NOTE — Progress Notes (Unsigned)
Cardiology Office Note   Date:  09/16/2021   ID:  Ivan Mccoy, DOB [birth year], MRN [id number]  PCP:  Deland Pretty, MD  Cardiologist:   Omaria Plunk [place], MD   No chief complaint on file.      History of Present Illness: Ivan Mccoy is a 86 y.o. male who is seen at the request of Dr  Bobby Rumpf for evaluation of CAD and paroxysmal Afib.  He lives at [facility]. He has a history of atrial fibrillation status post ablation in September 2013.  He also has a history of sleep apnea and is compliant with CPAP therapy. Additional past medical history is significant for hypertension and hyperlipidemia. He had a Cardiolite exercise tolerance test 05/06/2011 which was negative for ischemia. Echocardiogram also in January 2013 showed normal left ventricular systolic function with an estimated ejection fraction of 50-55%. There was moderate left ventricular hypertrophy with diastolic dysfunction. The left atrium and right atrium were mildly enlarged. Moderate tricuspid regurgitation and mild pulmonary hypertension was noted at that time.  In May 2017 he presented here with complaints of dyspnea, cough, subjective fever and chills. His CXR showed evidence of bilateral pneumonia and potentially superimposed mild interstitial edema. BNP was abnormal at 1070. Troponin was elevated at 1.15. Ecg showed T wave inversions in V5-6. He was treated for CAP. He was noted to have some episodes of Afib. Echo showed normal EF with mild basal septal hypertrophy. Mild MR and TR. Mild pulmonary HTN. He did undergo left heart cath which revealed 3 vessel obstructive CAD with predominant branch vessel disease. This includes the first and second diagonal, PLOM and RV marginal branches of the RCA. The LCx lesion appeared to be the culprit vessel and was treated w/ DES to mid distal LCx. He was DC on Plavix and Xarelto.   On subsequent follow up with his cardiologist in [place] he was noted to have a more significant murmur and  Echo showed anterior MV prolapse with moderate MR. TEE showed severe prolapse of the A2 scallop of the anterior leaflet with severe MR. He also had severe pulmonary HTN. Event monitor in 2017 showed NSR with PACs. No Afib. He was referred to [facility] and seen by Dr Evelina Dun. On 12/21/15 he had attempt at MV repair but ultimately required MV replacement with a #27 Carentier Edwards pericardial valve. Echo on November 03, 2017 showed EF 50% with moderate LVH. Moderate LAE. Normally functioning MV prosthesis. Mean gradient 3 mm Hg. Trivial MR. Mild AI and TR.   He was seen in October. BP was high. Amlodipine was increased so amlodipine increased to 10 mg daily. BP still high so we switched losartan to valsartan 320 mg daily. Echo was repeated in October- results noted below.  Normal LV function, MV gradient of 3-4 mm Hg. Mild to moderate MR. Normal RV pressures. On follow up today he is doing well. Denies any chest pain or SOB. Does feel tired easily. Has a nagging cough over the past 2 weeks. Brings BP readings - typically in 140-150 range with diastolic of 70.     Past Medical History:  Diagnosis Date   Arrhythmia    paroxysmal afib   CHF (congestive heart failure) (HCC)    Coronary artery disease    Heart murmur    Hyperlipidemia    Hypertension    Macular degeneration    Obstructive sleep apnea     Past Surgical History:  Procedure Laterality Date   CARDIAC CATHETERIZATION N/A 08/31/2015  Procedure: Left Heart Cath and Coronary Angiography;  Surgeon: Milda Lindvall M Martinique, MD;  Location: [facility];  Service: Cardiovascular;  Laterality: N/A;   CARDIAC CATHETERIZATION N/A 08/31/2015   Procedure: Coronary Stent Intervention;  Surgeon: Yaser Harvill M [place], MD;  Location: [facility];  Service: Cardiovascular;  Laterality: N/A;   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    TONSILLECTOMY AND ADENOIDECTOMY        Current Outpatient Medications  Medication Sig Dispense Refill   amLODipine (NORVASC) 10 MG tablet Take 1 tablet (10 mg total) by mouth daily. 90 tablet 3   atorvastatin (LIPITOR) 40 MG tablet TAKE 1 TABLET BY MOUTH DAILY 90 tablet 1   carvedilol (COREG) 25 MG tablet TAKE ONE TABLET BY MOUTH TWICE DAILY 180 tablet 2   Cholecalciferol (VITAMIN D3) 25 MCG (1000 UT) CAPS Take 1 tablet by mouth 3 (three) times daily.     EPINEPHRINE 0.3 mg/0.3 mL IJ SOAJ injection INJECT INTO MUSCLE ONCE FOR ALLERGIC REACTION 2 each 2   eplerenone (INSPRA) 50 MG tablet TAKE 1 TABLET BY MOUTH DAILY 90 tablet 3   fenofibrate 160 MG tablet TAKE ONE (1) TABLET BY MOUTH EVERY DAY 90 tablet 2   furosemide (LASIX) 20 MG tablet TAKE ONE (1) TABLET BY MOUTH EVERY DAY 90 tablet 1   GLUCOSAMINE HCL PO Take 1 capsule by mouth 3 (three) times daily.     isosorbide mononitrate (IMDUR) 30 MG 24 hr tablet Take 1 tablet (30 mg total) by mouth daily. Pt needs to keep upcoming appt in June for further refills 90 tablet 0   metroNIDAZOLE (METROCREAM) 0.75 % cream Apply 1 application topically every morning. APPLY TO FACE     Multiple Vitamins-Minerals (CENTRUM SILVER ADULT 50+ PO) Take 1 tablet by mouth daily with breakfast.     Multiple Vitamins-Minerals (VITAMIN D3 COMPLETE) TABS See admin instructions.     potassium chloride (KLOR-CON) 20 MEQ packet 1 packet with food     potassium chloride SA (KLOR-CON M) 20 MEQ tablet TAKE 1 TABLET BY MOUTH DAILY 90 tablet 1   Probiotic Product (PROBIOTIC DAILY) CAPS Take 1 capsule by mouth at bedtime.     Psyllium (METAMUCIL FIBER PO) Take 1 capsule by mouth every morning.     valsartan (DIOVAN) 320 MG tablet Take 1 tablet (320 mg total) by mouth daily. 90 tablet 3   XARELTO 20 MG TABS tablet TAKE 1 TABLET BY MOUTH DAILY WITH SUPPER 90 tablet 3   No current facility-administered medications for this visit.    Allergies:   Other, Peanut oil, Peanut-containing drug products, Peanut  butter flavor, Sulfa antibiotics, Eliquis [apixaban], and Spironolactone    Social History:  The patient  reports that he has never smoked. He has never used smokeless tobacco.   Family History:  The patient's family history includes Heart attack in his father.    ROS:  Please see the history of present illness.   Otherwise, review of systems are positive for none.   All other systems are reviewed and negative.    PHYSICAL EXAM: VS:  There were no vitals taken for this visit. , BMI There is no height or weight on file to calculate BMI. GEN: Well nourished, well developed, in no acute distress  HEENT: normal  Neck: no JVD, carotid bruits, or masses Cardiac: RRR; soft 1/6 systolic murmur LSB/apex, no rubs,  or gallops,no edema  Respiratory:  clear to auscultation bilaterally, normal work of breathing GI: soft, nontender, nondistended, + BS MS: no deformity or atrophy  Skin: warm and dry, no rash Neuro:  Strength and sensation are intact Psych: euthymic mood, full affect   EKG:  EKG is not ordered today.    Recent Labs: No results found for requested labs within last 365 days.    Lipid Panel No results found for: "CHOL", "TRIG", "HDL", "CHOLHDL", "VLDL", "LDLCALC", "LDLDIRECT"   Dated 01/15/19: LDL 43, triglycerides 81, TSH normal.  Dated 05/22/20: cholesterol 93, triglycerides 86, HDL 34, LDL 42. GFR 59.  Dated 05/27/21: cholesterol 112, triglycerides 104, HDL 38, NonHDL 74.   Cardiac Catheterization: 08/31/2015    Dist RCA lesion, 100% stenosed. RPDA lesion, 40% stenosed. Acute Mrg lesion, 99% stenosed. Ost 1st Diag lesion, 80% stenosed. Ost 2nd Diag to 2nd Diag lesion, 90% stenosed. There is mild left ventricular systolic dysfunction. Prox Cx to Mid Cx lesion, 99% stenosed. Post intervention, there is a 0% residual stenosis.   1. 3 vessel obstructive CAD with predominant branch vessel disease. This includes the first and second diagonal, PLOM and RV marginal branches of  the RCA. The LCx lesion appears to be the culprit vessel 2. Mild LV dysfunction with elevated LV EDP 3. Successful stenting of the mid-distal LCx with DES.    Plan: DAPT with ASA and Plavix for one month then discontinue ASA. Continue Plavix for at least one year. Plan to resume Pradaxa tomorrow. Patient needs additional IV diuresis. Continue beta blocker and add oral nitrate.   Echo 08/25/15:Study Conclusions   - Procedure narrative: Transthoracic echocardiography. Image   quality was adequate. The study was technically difficult. - Left ventricle: The cavity size was normal. There was mild focal   basal hypertrophy of the septum. Systolic function was normal.   Wall motion was normal; there were no regional wall motion   abnormalities. The study is not technically sufficient to allow   evaluation of LV diastolic function. - Aortic valve: Transvalvular velocity was within the normal range.   There was no stenosis. There was mild regurgitation. - Mitral valve: Calcified annulus. There was mild regurgitation. - Left atrium: The atrium was moderately dilated. - Right ventricle: The cavity size was normal. Wall thickness was   normal. Systolic function was normal. - Tricuspid valve: There was mild regurgitation. - Pulmonary arteries: Systolic pressure was moderately increased.   PA peak pressure: 49 mm Hg (S). - Inferior vena cava: The vessel was normal in size. The   respirophasic diameter changes were blunted (< 50%), consistent   with normal central venous pressure.  Echo 01/12/21: IMPRESSIONS     1. The mitral valve has been replaced with a 27 mm Carpenteir Edwards  Pericardial Valve.   2. Prothestic valve parameters: Pressure Halte time 106 ms, TVI 3, EOA  1.4 cm2, mean gradient 3. There is evidence of some patient prosthesis  mismatch without significant mitral gradients and stenosis. There is  evidence of some degree of pathologic  regurgitation not well visualized by color  Doppler. In the setting of  multiple valve disease (aortic, pulmonic, mitral), consider TEE for  further assessment, or earlier screening TTE follow up.   3. Pulmonic valve regurgitation is moderate.   4. Left ventricular ejection fraction, by estimation, is 55 to 60%. Left  ventricular ejection fraction by 3D volume is 55 %. The left ventricle has  normal function. The left ventricle has no regional wall  motion  abnormalities. Left ventricular diastolic   parameters are indeterminate.   5. Right ventricular systolic function is normal. The right ventricular  size is normal. There is normal pulmonary artery systolic pressure.   6. Left atrial size was moderately dilated.   7. The aortic valve was not well visualized. Aortic valve regurgitation  is mild. No aortic stenosis is present.   Comparison(s): A prior study was performed on 08/25/2015. New mitral valve  prosthesis.   ASSESSMENT AND PLAN:  1.  CAD. S/p  NSTEMI in setting of CAP in May 2017. S/p DES of mid to distal LCx. Branch vessel disease as noted. Continue antianginal therapy with nitrates, Coreg, amlodipine. No antiplatelet therapy since on Xarelto. He is  asymptomatic.   2. Paroxysmal Afib. No recurrence documented since his valve surgery in 2017. He is at higher risk for recurrence. [place] Vasc score of 4. I would advise to continue Xarelto unless he was to have problems with bleeding.   3. History of MVP with severe MR. S/p MV replacement in 2017 with pericardial valve. Echo in October Ok. Some MR mild to moderate.  SBE prophylaxis.   4. Hypercholesterolemia. Excellent control.   5. HTN- BP has been difficult to control. On high doses of multiple medications. Overall I am satisfied with current level of control .   6. Chronic diastolic CHF. Well compensated.   7. OSA on CPAP   Current medicines are reviewed at length with the patient today.  The patient does not have concerns regarding medicines.  The following changes  have been made:  no change  Labs/ tests ordered today include:   No orders of the defined types were placed in this encounter.     Disposition:   FU with me in 6 months  Signed, Claryssa Sandner Martin[place], MD  09/16/2021 7:52 AM    [facility] [address], [place], [place], [address] Phone [phone], Fax [phone]

## 2021-09-20 ENCOUNTER — Ambulatory Visit (INDEPENDENT_AMBULATORY_CARE_PROVIDER_SITE_OTHER): Payer: Medicare Other | Admitting: Cardiology

## 2021-09-20 ENCOUNTER — Encounter: Payer: Self-pay | Admitting: Cardiology

## 2021-09-20 VITALS — BP 128/68 | HR 67 | Ht 69.5 in | Wt 188.6 lb

## 2021-09-20 DIAGNOSIS — Z952 Presence of prosthetic heart valve: Secondary | ICD-10-CM | POA: Diagnosis not present

## 2021-09-20 DIAGNOSIS — I480 Paroxysmal atrial fibrillation: Secondary | ICD-10-CM

## 2021-09-20 DIAGNOSIS — E7800 Pure hypercholesterolemia, unspecified: Secondary | ICD-10-CM

## 2021-09-20 DIAGNOSIS — I10 Essential (primary) hypertension: Secondary | ICD-10-CM

## 2021-09-20 DIAGNOSIS — I25118 Atherosclerotic heart disease of native coronary artery with other forms of angina pectoris: Secondary | ICD-10-CM

## 2021-09-20 DIAGNOSIS — I340 Nonrheumatic mitral (valve) insufficiency: Secondary | ICD-10-CM | POA: Diagnosis not present

## 2021-09-20 NOTE — Patient Instructions (Signed)

## 2021-09-21 ENCOUNTER — Encounter: Payer: Self-pay | Admitting: Cardiology

## 2021-11-01 ENCOUNTER — Other Ambulatory Visit: Payer: Self-pay | Admitting: Cardiology

## 2021-12-20 ENCOUNTER — Other Ambulatory Visit: Payer: Self-pay | Admitting: Cardiology

## 2022-03-16 NOTE — Progress Notes (Signed)
Cardiology Office Note   Date:  03/21/2022   ID:  Ivan Mccoy, DOB 04-28-1935, MRN [id number]  PCP:  Ivan Brunette, MD  Cardiologist:   Ivan Ricklefs [place], MD   Chief Complaint  Patient presents with   Coronary Artery Disease   Atrial Fibrillation       History of Present Illness: Ivan Mccoy is a 86 y.o. male who is seen at the request of Dr  Ivan Mccoy for evaluation of CAD and paroxysmal Afib.  He lives at [facility]. He has a history of atrial fibrillation status post ablation in September 2013.  He also has a history of sleep apnea and is compliant with CPAP therapy. Additional past medical history is significant for hypertension and hyperlipidemia. He had a Cardiolite exercise tolerance test 05/06/2011 which was negative for ischemia. Echocardiogram also in January 2013 showed normal left ventricular systolic function with an estimated ejection fraction of 50-55%. There was moderate left ventricular hypertrophy with diastolic dysfunction. The left atrium and right atrium were mildly enlarged. Moderate tricuspid regurgitation and mild pulmonary hypertension was noted at that time.  In May 2017 he presented here with complaints of dyspnea, cough, subjective fever and chills. His CXR showed evidence of bilateral pneumonia and potentially superimposed mild interstitial edema. BNP was abnormal at 1070. Troponin was elevated at 1.15. Ecg showed T wave inversions in V5-6. He was treated for CAP. He was noted to have some episodes of Afib. Echo showed normal EF with mild basal septal hypertrophy. Mild MR and TR. Mild pulmonary HTN. He did undergo left heart cath which revealed 3 vessel obstructive CAD with predominant branch vessel disease. This includes the first and second diagonal, PLOM and RV marginal branches of the RCA. The LCx lesion appeared to be the culprit vessel and was treated w/ DES to mid distal LCx. He was DC on Plavix and Xarelto.   On subsequent follow up with his cardiologist  in [place] he was noted to have a more significant murmur and Echo showed anterior MV prolapse with moderate MR. TEE showed severe prolapse of the A2 scallop of the anterior leaflet with severe MR. He also had severe pulmonary HTN. Event monitor in 2017 showed NSR with PACs. No Afib. He was referred to [facility] and seen by Dr Ivan Mccoy. On 12/21/15 he had attempt at MV repair but ultimately required MV replacement with a #27 Carentier Edwards pericardial valve. Echo on November 03, 2017 showed EF 50% with moderate LVH. Moderate LAE. Normally functioning MV prosthesis. Mean gradient 3 mm Hg. Trivial MR. Mild AI and TR.   He was seen in October. BP was high so amlodipine increased to 10 mg daily. BP still high so we switched losartan to valsartan 320 mg daily. Echo was repeated in nov- Normal LV function, MV gradient of 3-4 mm Hg. Mild to moderate MR. Normal RV pressures.   On follow up today he is doing well. Denies any chest pain or SOB. No palpitations. BP is excellent.  He has a bad knee and is planning to have surgery on April 1 with Dr Ivan Mccoy. He feels really well today without any cardiac complaints.     Past Medical History:  Diagnosis Date   Arrhythmia    paroxysmal afib   CHF (congestive heart failure) (HCC)    Coronary artery disease    Heart murmur    Hyperlipidemia    Hypertension    Macular degeneration    Obstructive sleep apnea     Past Surgical  History:  Procedure Laterality Date   CARDIAC CATHETERIZATION N/A 08/31/2015   Procedure: Left Heart Cath and Coronary Angiography;  Surgeon: Ivan Shawhan M [place], MD;  Location: [facility] INVASIVE CV LAB;  Service: Cardiovascular;  Laterality: N/A;   CARDIAC CATHETERIZATION N/A 08/31/2015   Procedure: Coronary Stent Intervention;  Surgeon: Ivan Conteh M [place], MD;  Location: [facility] INVASIVE CV LAB;  Service: Cardiovascular;  Laterality: N/A;   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    TONSILLECTOMY AND ADENOIDECTOMY       Current Outpatient Medications  Medication Sig Dispense Refill   amLODipine (NORVASC) 10 MG tablet TAKE ONE (1) TABLET BY MOUTH EACH DAY 90 tablet 3   atorvastatin (LIPITOR) 40 MG tablet TAKE 1 TABLET BY MOUTH DAILY 90 tablet 1   carvedilol (COREG) 25 MG tablet TAKE ONE TABLET BY MOUTH TWICE DAILY 180 tablet 2   Cholecalciferol (VITAMIN D3) 25 MCG (1000 UT) CAPS Take 1 tablet by mouth 3 (three) times daily.     EPINEPHRINE 0.3 mg/0.3 mL IJ SOAJ injection INJECT INTO MUSCLE ONCE FOR ALLERGIC REACTION 2 each 2   eplerenone (INSPRA) 50 MG tablet TAKE 1 TABLET BY MOUTH DAILY 90 tablet 3   fenofibrate 160 MG tablet TAKE ONE (1) TABLET BY MOUTH EVERY DAY 90 tablet 2   furosemide (LASIX) 20 MG tablet TAKE ONE (1) TABLET BY MOUTH EVERY DAY 90 tablet 2   GLUCOSAMINE HCL PO Take 1 capsule by mouth 3 (three) times daily.     isosorbide mononitrate (IMDUR) 30 MG 24 hr tablet TAKE 1 TABLET BY MOUTH DAILY 90 tablet 2   metroNIDAZOLE (METROCREAM) 0.75 % cream Apply 1 application topically every morning. APPLY TO FACE     Multiple Vitamins-Minerals (CENTRUM SILVER ADULT 50+ PO) Take 1 tablet by mouth daily with breakfast.     Multiple Vitamins-Minerals (VITAMIN D3 COMPLETE) TABS See admin instructions.     potassium chloride SA (KLOR-CON M) 20 MEQ tablet TAKE 1 TABLET BY MOUTH DAILY 90 tablet 2   Probiotic Product (PROBIOTIC DAILY) CAPS Take 1 capsule by mouth at bedtime.     Psyllium (METAMUCIL FIBER PO) Take 1 capsule by mouth every morning.     rivaroxaban (XARELTO) 20 MG TABS tablet TAKE 1 TABLET BY MOUTH DAILY WITH SUPPER 90 tablet 1   valsartan (DIOVAN) 320 MG tablet TAKE ONE (1) TABLET BY MOUTH EACH DAY 90 tablet 3   Glucosamine-Chondroit-Vit C-Mn (GLUCOSAMINE CHONDR 500 COMPLEX) CAPS as directed Orally     Multiple Vitamins-Minerals (PRESERVISION AREDS) TABS as directed Orally Twice a day     Psyllium (METAMUCIL) 0.36 g CAPS 1 packet with 8 ounces of  liquid as needed Orally Three times a day     No current facility-administered medications for this visit.    Allergies:   Other, Peanut oil, Peanut-containing drug products, Peanut butter flavor, Sulfa antibiotics, Eliquis [apixaban], and Spironolactone    Social History:  The patient  reports that he has never smoked. He has never used smokeless tobacco.   Family History:  The patient's family history includes Heart attack in his father.    ROS:  Please see the history of present illness.   Otherwise, review of systems are positive for none.   All other systems are reviewed and negative.    PHYSICAL EXAM: VS:  BP 130/62   Pulse 60   Ht 5' 9.5" (1.765 m)  Wt 186 lb (84.4 kg)   SpO2 97%   BMI 27.07 kg/m  , BMI Body mass index is 27.07 kg/m. GEN: Well nourished, well developed, in no acute distress  HEENT: normal  Neck: no JVD, carotid bruits, or masses Cardiac: RRR; soft 1/6 systolic murmur LSB/apex, no rubs, or gallops,no edema  Respiratory:  clear to auscultation bilaterally, normal work of breathing GI: soft, nontender, nondistended, + BS MS: no deformity or atrophy  Skin: warm and dry, no rash Neuro:  Strength and sensation are intact Psych: euthymic mood, full affect   EKG:  EKG is not ordered today.    Recent Labs: No results found for requested labs within last 365 days.    Lipid Panel No results found for: "CHOL", "TRIG", "HDL", "CHOLHDL", "VLDL", "LDLCALC", "LDLDIRECT"   Dated 01/15/19: LDL 43, triglycerides 81, TSH normal.  Dated 05/22/20: cholesterol 93, triglycerides 86, HDL 34, LDL 42. GFR 59.  Dated 05/27/21: cholesterol 112, triglycerides 104, HDL 38, NonHDL 74.   Cardiac Catheterization: 08/31/2015    Dist RCA lesion, 100% stenosed. RPDA lesion, 40% stenosed. Acute Mrg lesion, 99% stenosed. Ost 1st Diag lesion, 80% stenosed. Ost 2nd Diag to 2nd Diag lesion, 90% stenosed. There is mild left ventricular systolic dysfunction. Prox Cx to Mid Cx  lesion, 99% stenosed. Post intervention, there is a 0% residual stenosis.   1. 3 vessel obstructive CAD with predominant branch vessel disease. This includes the first and second diagonal, PLOM and RV marginal branches of the RCA. The LCx lesion appears to be the culprit vessel 2. Mild LV dysfunction with elevated LV EDP 3. Successful stenting of the mid-distal LCx with DES.    Plan: DAPT with ASA and Plavix for one month then discontinue ASA. Continue Plavix for at least one year. Plan to resume Pradaxa tomorrow. Patient needs additional IV diuresis. Continue beta blocker and add oral nitrate.   Echo 08/25/15:Study Conclusions   - Procedure narrative: Transthoracic echocardiography. Image   quality was adequate. The study was technically difficult. - Left ventricle: The cavity size was normal. There was mild focal   basal hypertrophy of the septum. Systolic function was normal.   Wall motion was normal; there were no regional wall motion   abnormalities. The study is not technically sufficient to allow   evaluation of LV diastolic function. - Aortic valve: Transvalvular velocity was within the normal range.   There was no stenosis. There was mild regurgitation. - Mitral valve: Calcified annulus. There was mild regurgitation. - Left atrium: The atrium was moderately dilated. - Right ventricle: The cavity size was normal. Wall thickness was   normal. Systolic function was normal. - Tricuspid valve: There was mild regurgitation. - Pulmonary arteries: Systolic pressure was moderately increased.   PA peak pressure: 49 mm Hg (S). - Inferior vena cava: The vessel was normal in size. The   respirophasic diameter changes were blunted (< 50%), consistent   with normal central venous pressure.  Echo 01/12/21: IMPRESSIONS     1. The mitral valve has been replaced with a 27 mm Carpenteir Edwards  Pericardial Valve.   2. Prothestic valve parameters: Pressure Halte time 106 ms, TVI 3, EOA  1.4  cm2, mean gradient 3. There is evidence of some patient prosthesis  mismatch without significant mitral gradients and stenosis. There is  evidence of some degree of pathologic  regurgitation not well visualized by color Doppler. In the setting of  multiple valve disease (aortic, pulmonic, mitral), consider TEE for  further assessment,  or earlier screening TTE follow up.   3. Pulmonic valve regurgitation is moderate.   4. Left ventricular ejection fraction, by estimation, is 55 to 60%. Left  ventricular ejection fraction by 3D volume is 55 %. The left ventricle has  normal function. The left ventricle has no regional wall motion  abnormalities. Left ventricular diastolic   parameters are indeterminate.   5. Right ventricular systolic function is normal. The right ventricular  size is normal. There is normal pulmonary artery systolic pressure.   6. Left atrial size was moderately dilated.   7. The aortic valve was not well visualized. Aortic valve regurgitation  is mild. No aortic stenosis is present.   Comparison(s): A prior study was performed on 08/25/2015. New mitral valve  prosthesis.   ASSESSMENT AND PLAN:  1.  CAD. S/p  NSTEMI in setting of CAP in May 2017. S/p DES of mid to distal LCx. Branch vessel disease as noted. Continue antianginal therapy with nitrates, Coreg, amlodipine. No antiplatelet therapy since on Xarelto. He is  asymptomatic.   2. Paroxysmal Afib. No recurrence documented since his valve surgery in 2017. He is at higher risk for recurrence. [place] Vasc score of 4. I would advise to continue Xarelto unless he was to have problems with bleeding.   3. History of MVP with severe MR. S/p MV replacement in 2017 with pericardial valve. Echo in Nov 2022 showed some MR mild to moderate.  SBE prophylaxis.   4. Hypercholesterolemia. Excellent control.   5. HTN- currently well controlled on multiple meds.   6. Chronic diastolic CHF. Well compensated.   7. OSA on CPAP  8.  Osteoarthritis of knee. As long as clinical status is stable he should be safe to proceed with planned knee surgery in April. Would need to hold Xarelto for 48 hours prior.    Current medicines are reviewed at length with the patient today.  The patient does not have concerns regarding medicines.  The following changes have been made:  no change  Labs/ tests ordered today include:   No orders of the defined types were placed in this encounter.     Disposition:   FU with me in 6 months  Signed, Dream Harman [place], MD  03/21/2022 9:28 AM    [facility] [address], [place], [place], [id number] Phone [phone], Fax [phone]

## 2022-03-17 ENCOUNTER — Other Ambulatory Visit: Payer: Self-pay | Admitting: Cardiology

## 2022-03-17 NOTE — Telephone Encounter (Signed)
Prescription refill request for Xarelto received.  Indication: Afib  Last office visit: 09/20/21 ([place])  Weight: 84.5kg Age: 86 Scr: 1.2 (05/28/21)  CrCl: 55.46ml/min  Appropriate dose and refill sent to requested pharmacy.

## 2022-03-21 ENCOUNTER — Ambulatory Visit: Payer: Medicare Other | Attending: Cardiology | Admitting: Cardiology

## 2022-03-21 ENCOUNTER — Encounter: Payer: Self-pay | Admitting: Cardiology

## 2022-03-21 VITALS — BP 130/62 | HR 60 | Ht 69.5 in | Wt 186.0 lb

## 2022-03-21 DIAGNOSIS — Z952 Presence of prosthetic heart valve: Secondary | ICD-10-CM | POA: Diagnosis not present

## 2022-03-21 DIAGNOSIS — I340 Nonrheumatic mitral (valve) insufficiency: Secondary | ICD-10-CM | POA: Diagnosis not present

## 2022-03-21 DIAGNOSIS — I10 Essential (primary) hypertension: Secondary | ICD-10-CM | POA: Diagnosis present

## 2022-03-21 DIAGNOSIS — E7800 Pure hypercholesterolemia, unspecified: Secondary | ICD-10-CM | POA: Insufficient documentation

## 2022-03-21 DIAGNOSIS — I480 Paroxysmal atrial fibrillation: Secondary | ICD-10-CM | POA: Insufficient documentation

## 2022-03-21 DIAGNOSIS — I25118 Atherosclerotic heart disease of native coronary artery with other forms of angina pectoris: Secondary | ICD-10-CM | POA: Diagnosis not present

## 2022-03-28 ENCOUNTER — Other Ambulatory Visit: Payer: Self-pay | Admitting: Cardiology

## 2022-04-05 ENCOUNTER — Telehealth: Payer: Self-pay

## 2022-04-05 NOTE — Telephone Encounter (Signed)
   Pre-operative Risk Assessment    Patient Name: Ivan Mccoy  DOB: November 09, 1935 MRN: [id number]      Request for Surgical Clearance    Procedure:   Prostate Biopsy-MRI Fusion BX  Date of Surgery:  Clearance TBD                                 Surgeon:  Dr. Berniece Salines Surgeon's Group or Practice Name:  [facility] Phone number:  [phone] Fax number:  [phone]   Type of Clearance Requested:   - Pharmacy:  Hold Rivaroxaban (Xarelto) 3   Type of Anesthesia:  Not Indicated   Additional requests/questions:    Signed, Brunetta Genera   04/05/2022, 10:16 AM

## 2022-04-06 NOTE — Telephone Encounter (Signed)
Patient with diagnosis of afib on Xarelto for anticoagulation.    Procedure: Prostate Biopsy-MRI Fusion BX  Date of procedure: TBD  CHA2DS2-VASc Score = 5  This indicates a 7.2% annual risk of stroke. The patient's score is based upon: CHF History: 1 HTN History: 1 Diabetes History: 0 Stroke History: 0 Vascular Disease History: 1 Age Score: 2 Gender Score: 0   CrCl 75mL/min  Per office protocol, patient can hold Xarelto for 3 days prior to procedure as requested.    **This guidance is not considered finalized until pre-operative APP has relayed final recommendations.**

## 2022-04-06 NOTE — Telephone Encounter (Signed)
   Patient Name: Ivan Mccoy  DOB: Aug 10, 1935 MRN: [id number]  Primary Cardiologist: Peter [place], MD  Clinical pharmacists have reviewed the patient's past medical history, labs, and current medications as part of preoperative protocol coverage. The following recommendations have been made:  Patient with diagnosis of afib on Xarelto for anticoagulation.     Procedure: Prostate Biopsy-MRI Fusion BX  Date of procedure: TBD   CHA2DS2-VASc Score = 5  This indicates a 7.2% annual risk of stroke. The patient's score is based upon: CHF History: 1 HTN History: 1 Diabetes History: 0 Stroke History: 0 Vascular Disease History: 1 Age Score: 2 Gender Score: 0   CrCl 23mL/min   Per office protocol, patient can hold Xarelto for 3 days prior to procedure as requested.  Please resume Xarelto as soon as possible post procedure at the discretion of the surgeon.   I will route this recommendation to the requesting party via Epic fax function and remove from pre-op pool.  Please call with questions.  Flossie Dibble, NP 04/06/2022, 1:50 PM

## 2022-05-10 ENCOUNTER — Other Ambulatory Visit: Payer: Self-pay | Admitting: Cardiology

## 2022-06-27 NOTE — Patient Instructions (Signed)
SURGICAL WAITING ROOM VISITATION  Patients having surgery or a procedure may have no more than 2 support people in the waiting area - these visitors may rotate.    Children under the age of 67 must have an adult with them who is not the patient.  Due to an increase in RSV and influenza rates and associated hospitalizations, children ages 62 and under may not visit patients in [place].  If the patient needs to stay at the hospital during part of their recovery, the visitor guidelines for inpatient rooms apply. Pre-op nurse will coordinate an appropriate time for 1 support person to accompany patient in pre-op.  This support person may not rotate.    Please refer to the [facility] website for the visitor guidelines for Inpatients (after your surgery is over and you are in a regular room).       Your procedure is scheduled on:  07/11/22    Report to [facility] Main Entrance    Report to admitting at  0600 AM   Call this number if you have problems the morning of surgery [phone]   Do not eat food :After Midnight.   After Midnight you may have the following liquids until _ 0515_____ AM DAY OF SURGERY  Water Non-Citrus Juices (without pulp, NO RED-Apple, White grape, White cranberry) Black Coffee (NO MILK/CREAM OR CREAMERS, sugar ok)  Clear Tea (NO MILK/CREAM OR CREAMERS, sugar ok) regular and decaf                             Plain Jell-O (NO RED)                                           Fruit ices (not with fruit pulp, NO RED)                                     Popsicles (NO RED)                                                               Sports drinks like Gatorade (NO RED)                     The day of surgery:  Drink ONE (1) Pre-Surgery Clear Ensure or G2 at   0515AM the morning of surgery. Drink in one sitting. Do not sip.  This drink was given to you during your hospital  pre-op appointment visit. Nothing else to drink after completing the   Pre-Surgery Clear Ensure or G2.          If you have questions, please contact your surgeon's office.      Oral Hygiene is also important to reduce your risk of infection.                                    Remember - BRUSH YOUR TEETH THE MORNING OF SURGERY WITH YOUR REGULAR TOOTHPASTE  DENTURES WILL  BE REMOVED PRIOR TO SURGERY PLEASE DO NOT APPLY "Poly grip" OR ADHESIVES!!!   Do NOT smoke after Midnight   Take these medicines the morning of surgery with A SIP OF WATER: amlodipine, coreg, inspra, imdur   DO NOT TAKE ANY ORAL DIABETIC MEDICATIONS DAY OF YOUR SURGERY  Bring CPAP mask and tubing day of surgery.                              You may not have any metal on your body including hair pins, jewelry, and body piercing             Do not wear make-up, lotions, powders, perfumes/cologne, or deodorant  Do not wear nail polish including gel and S&S, artificial/acrylic nails, or any other type of covering on natural nails including finger and toenails. If you have artificial nails, gel coating, etc. that needs to be removed by a nail salon please have this removed prior to surgery or surgery may need to be canceled/ delayed if the surgeon/ anesthesia feels like they are unable to be safely monitored.   Do not shave  48 hours prior to surgery.               Men may shave face and neck.   Do not bring valuables to the hospital. [place].   Contacts, glasses, dentures or bridgework may not be worn into surgery.   Bring small overnight bag day of surgery.   DO NOT [place]. PHARMACY WILL DISPENSE MEDICATIONS LISTED ON YOUR MEDICATION LIST TO YOU DURING YOUR ADMISSION [place]!    Patients discharged on the day of surgery will not be allowed to drive home.  Someone NEEDS to stay with you for the first 24 hours after anesthesia.   Special Instructions: Bring a copy of your healthcare power  of attorney and living will documents the day of surgery if you haven't scanned them before.              Please read over the following fact sheets you were given: IF [place] [phone]   If you received a COVID test during your pre-op visit  it is requested that you wear a mask when out in public, stay away from anyone that may not be feeling well and notify your surgeon if you develop symptoms. If you test positive for Covid or have been in contact with anyone that has tested positive in the last 10 days please notify you surgeon.    [place] - Preparing for Surgery Before surgery, you can play an important role.  Because skin is not sterile, your skin needs to be as free of germs as possible.  You can reduce the number of germs on your skin by washing with CHG (chlorahexidine gluconate) soap before surgery.  CHG is an antiseptic cleaner which kills germs and bonds with the skin to continue killing germs even after washing. Please DO NOT use if you have an allergy to CHG or antibacterial soaps.  If your skin becomes reddened/irritated stop using the CHG and inform your nurse when you arrive at Short Stay. Do not shave (including legs and underarms) for at least 48 hours prior to the first CHG shower.  You may shave  your face/neck. Please follow these instructions carefully:  1.  Shower with CHG Soap the night before surgery and the  morning of Surgery.  2.  If you choose to wash your hair, wash your hair first as usual with your  normal  shampoo.  3.  After you shampoo, rinse your hair and body thoroughly to remove the  shampoo.                           4.  Use CHG as you would any other liquid soap.  You can apply chg directly  to the skin and wash                       Gently with a scrungie or clean washcloth.  5.  Apply the CHG Soap to your body ONLY FROM THE NECK DOWN.   Do not use on face/ open                           Wound or  open sores. Avoid contact with eyes, ears mouth and genitals (private parts).                       Wash face,  Genitals (private parts) with your normal soap.             6.  Wash thoroughly, paying special attention to the area where your surgery  will be performed.  7.  Thoroughly rinse your body with warm water from the neck down.  8.  DO NOT shower/wash with your normal soap after using and rinsing off  the CHG Soap.                9.  Pat yourself dry with a clean towel.            10.  Wear clean pajamas.            11.  Place clean sheets on your bed the night of your first shower and do not  sleep with pets. Day of Surgery : Do not apply any lotions/deodorants the morning of surgery.  Please wear clean clothes to the hospital/surgery center.  FAILURE TO FOLLOW THESE INSTRUCTIONS MAY RESULT IN THE CANCELLATION OF YOUR SURGERY PATIENT SIGNATURE_________________________________  NURSE SIGNATURE__________________________________  ________________________________________________________________________

## 2022-06-27 NOTE — Progress Notes (Signed)
Anesthesia Review:  PCP: Cardiologist : DR Peter Martinique LOV 12/11/223  Chest x-ray : EKG : 09/20/21  Echo : 02/22/21  Stress test: Cardiac Cath :  2017  Activity level:  Sleep Study/ CPAP : Fasting Blood Sugar :      / Checks Blood Sugar -- times a day:   Blood Thinner/ Instructions /Last Dose: ASA / Instructions/ Last Dose :    Xarelto    PT lives at [facility]

## 2022-06-28 NOTE — H&P (Signed)
TOTAL KNEE ADMISSION H&P  Patient is being admitted for left total knee arthroplasty.  Subjective:  Chief Complaint: Left knee pain.  HPI: Ivan Mccoy, 87 y.o. male has a history of pain and functional disability in the left knee due to arthritis and has failed non-surgical conservative treatments for greater than 12 weeks to include NSAID's and/or analgesics, corticosteriod injections, and activity modification. Onset of symptoms was gradual, starting several years ago with gradually worsening course since that time. The patient noted no past surgery on the left knee.  Patient currently rates pain in the left knee at 7 out of 10 with activity. Patient has night pain, worsening of pain with activity and weight bearing, and pain that interferes with activities of daily living. Patient has evidence of  bone-on-bone arthritis in the medial and patellofemoral compartments  by imaging studies. There is no active infection.  Patient Active Problem List   Diagnosis Date Noted   Stented coronary artery    Acute combined systolic and diastolic CHF, NYHA class 3 (HCC)    NSTEMI (non-ST elevated myocardial infarction) ([place])    Paroxysmal atrial fibrillation ([place]) 08/28/2015   Unstable angina pectoris ([place]) 08/28/2015   Demand ischemia of myocardium 08/28/2015   Elevated troponin 08/25/2015   Hypoxia    CAP (community acquired pneumonia) 08/24/2015   Community acquired pneumonia 08/24/2015    Past Medical History:  Diagnosis Date   Arrhythmia    paroxysmal afib   CHF (congestive heart failure) ([place])    Coronary artery disease    Heart murmur    Hyperlipidemia    Hypertension    Macular degeneration    Obstructive sleep apnea     Past Surgical History:  Procedure Laterality Date   CARDIAC CATHETERIZATION N/A 08/31/2015   Procedure: Left Heart Cath and Coronary Angiography;  Surgeon: Peter M Martinique, MD;  Location: [facility];  Service: Cardiovascular;  Laterality: N/A;   CARDIAC  CATHETERIZATION N/A 08/31/2015   Procedure: Coronary Stent Intervention;  Surgeon: Peter M Martinique, MD;  Location: [facility];  Service: Cardiovascular;  Laterality: N/A;   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    TONSILLECTOMY AND ADENOIDECTOMY      Prior to Admission medications   Medication Sig Start Date End Date Taking? Authorizing Provider  acetaminophen (TYLENOL) 500 MG tablet Take 500-1,000 mg by mouth every 6 (six) hours as needed (pain.).   Yes [provider]  amLODipine (NORVASC) 10 MG tablet TAKE ONE (1) TABLET BY MOUTH EACH DAY Patient taking differently: Take 10 mg by mouth every evening. 03/17/22  Yes Martinique, Peter M, MD  atorvastatin (LIPITOR) 40 MG tablet TAKE 1 TABLET BY MOUTH DAILY Patient taking differently: Take 40 mg by mouth every evening. 03/30/22  Yes Martinique, Peter M, MD  carvedilol (COREG) 25 MG tablet TAKE ONE TABLET BY MOUTH TWICE DAILY 05/11/22  Yes Martinique, Peter M, MD  Cholecalciferol (VITAMIN D3) 25 MCG (1000 UT) CAPS Take 1,000 Units by mouth 3 (three) times daily.   Yes [provider]  EPINEPHRINE 0.3 mg/0.3 mL IJ SOAJ injection INJECT INTO MUSCLE ONCE FOR ALLERGIC REACTION 09/29/20  Yes Martinique, Peter M, MD  eplerenone (INSPRA) 50 MG tablet TAKE 1 TABLET BY MOUTH DAILY 11/01/21  Yes Martinique, Peter M, MD  fenofibrate 160 MG tablet TAKE ONE (1) TABLET BY MOUTH EVERY DAY 05/11/22  Yes Martinique, Peter M,  MD  furosemide (LASIX) 20 MG tablet TAKE ONE (1) TABLET BY MOUTH EVERY DAY 12/20/21  Yes Martinique, Peter M, MD  GLUCOSAMINE HCL PO Take 1 capsule by mouth 3 (three) times daily.   Yes [provider]  isosorbide mononitrate (IMDUR) 30 MG 24 hr tablet TAKE 1 TABLET BY MOUTH DAILY 12/20/21  Yes Martinique, Peter M, MD  metroNIDAZOLE (METROCREAM) 0.75 % cream Apply 1 application topically every morning. APPLY TO FACE   Yes [provider]  Multiple  Vitamin (MULTIVITAMIN WITH MINERALS) TABS tablet Take 1 tablet by mouth in the morning. Centrum Silver   Yes [provider]  Multiple Vitamins-Minerals (PRESERVISION AREDS) TABS Take 1 tablet by mouth in the morning and at bedtime.   Yes [provider]  potassium chloride SA (KLOR-CON M) 20 MEQ tablet TAKE 1 TABLET BY MOUTH DAILY 12/20/21  Yes Martinique, Peter M, MD  Probiotic Product (PROBIOTIC DAILY) CAPS Take 1 capsule by mouth at bedtime.   Yes [provider]  Psyllium (METAMUCIL FIBER PO) Take 1 Dose by mouth every morning. powder   Yes [provider]  rivaroxaban (XARELTO) 20 MG TABS tablet TAKE 1 TABLET BY MOUTH DAILY WITH SUPPER 03/17/22  Yes Martinique, Peter M, MD  valsartan (DIOVAN) 320 MG tablet TAKE ONE (1) TABLET BY MOUTH EACH DAY 03/17/22  Yes Martinique, Peter M, MD    Allergies  Allergen Reactions   Other Anaphylaxis    Hummus NO GARBANZO BEANS!!!   Peanut Butter Flavor Anaphylaxis   Peanut Oil Anaphylaxis    deadly   Peanut-Containing Drug Products Anaphylaxis   Sulfa Antibiotics Other (See Comments)    PATIENT WAS (PERHAPS?) ALLERGIC TO THIS CLASS OF MEDS AS A TEENAGER   Eliquis [Apixaban] Rash   Spironolactone Rash    Social History   Socioeconomic History   Marital status: Married    Spouse name: Not on file   Number of children: Not on file   Years of education: Not on file   Highest education level: Not on file  Occupational History   Not on file  Tobacco Use   Smoking status: Never   Smokeless tobacco: Never  Substance and Sexual Activity   Alcohol use: Not on file   Drug use: Not on file   Sexual activity: Not on file  Other Topics Concern   Not on file  Social History Narrative   Not on file   Social Determinants of Health   Financial Resource Strain: Not on file  Food Insecurity: Not on file  Transportation Needs: Not on file  Physical Activity: Not on file  Stress: Not on file  Social Connections: Not on file   Intimate Partner Violence: Not on file    Tobacco Use: Low Risk  (03/21/2022)   Patient History    Smoking Tobacco Use: Never    Smokeless Tobacco Use: Never    Passive Exposure: Not on file   Social History   Substance and Sexual Activity  Alcohol Use None    Family History  Problem Relation Age of Onset   Heart attack Father     Review of Systems  Constitutional:  Negative for chills and fever.  HENT: Negative.    Eyes: Negative.   Respiratory:  Negative for cough and shortness of breath.   Cardiovascular:  Negative for chest pain and palpitations.  Gastrointestinal:  Negative for abdominal pain, nausea and vomiting.  Genitourinary:  Negative for dysuria, frequency and urgency.  Musculoskeletal:  Positive for  joint pain.  Skin:  Negative for rash.   Objective:  Physical Exam: Well nourished and well developed.  General: Alert and oriented x3, cooperative and pleasant, no acute distress.  Head: normocephalic, atraumatic, neck supple.  Eyes: EOMI.  Abdomen: non-tender to palpation and soft, normoactive bowel sounds. Musculoskeletal: The patient has an antalgic gait pattern favoring the right side with the use of a cane.   Left Hip Exam:  The range of motion: normal without discomfort.   Right Knee Exam:  No effusion present. No swelling present.  The range of motion is: 0 to 125 degrees.  No crepitus on range of motion of the knee.  No medial joint line tenderness.  No lateral joint line tenderness.  The knee is stable.   Left Knee Exam:  No effusion present. No swelling present.  The Range of motion is: 0 to 125 degrees.  Significant crepitus on range of motion of the knee.  Positive medial joint line tenderness.  No lateral joint line tenderness.  The knee is stable.  Calves soft and nontender. Motor function intact in LE. Strength 5/5 LE bilaterally. Neuro: Distal pulses 2+. Sensation to light touch intact in LE.  Vital signs in last 24 hours: BP:  ()/()  Arterial Line BP: ()/()   Imaging Review Plain radiographs demonstrate moderate degenerative joint disease of the left knee. The overall alignment is neutral. The bone quality appears to be adequate for age and reported activity level.  Assessment/Plan:  End stage arthritis, left knee   The patient history, physical examination, clinical judgment of the provider and imaging studies are consistent with end stage degenerative joint disease of the left knee and total knee arthroplasty is deemed medically necessary. The treatment options including medical management, injection therapy arthroscopy and arthroplasty were discussed at length. The risks and benefits of total knee arthroplasty were presented and reviewed. The risks due to aseptic loosening, infection, stiffness, patella tracking problems, thromboembolic complications and other imponderables were discussed. The patient acknowledged the explanation, agreed to proceed with the plan and consent was signed. Patient is being admitted for inpatient treatment for surgery, pain control, PT, OT, prophylactic antibiotics, VTE prophylaxis, progressive ambulation and ADLs and discharge planning. The patient is planning to be discharged  home .  Patient's anticipated LOS is less than 2 midnights, meeting these requirements: - Lives within 1 hour of care - Has a competent adult at home to recover with post-op - NO history of  - Chronic pain requiring opiods  - Diabetes  - Coronary Artery Disease  - Heart failure  - Heart attack  - Stroke  - DVT/VTE  - Respiratory Failure/COPD  - Renal failure  - Anemia  - Advanced Liver disease  Therapy Plans: [place] Disposition: Home with Wife Planned DVT Prophylaxis: Xarelto DME Needed: RW PCP: (contacting for clearance) Cardiologist: Peter Martinique, MD (clearance in EPIC note 03/21/2022) TXA: IV Allergies: Eliquis (rash), peanuts (anaphylaxis) Anesthesia Concerns: None BMI: 26.2 Last  HgbA1c: not diabetic  Pharmacy: [facility]  Other: -PCP appt tomorrow 06/28/2022 - taking clearance form with him to have faxed back -Per cardiologist, hold xarelto 2 days prior to surgery  - Patient was instructed on what medications to stop prior to surgery. - Follow-up visit in 2 weeks with Dr. Wynelle Link - Begin physical therapy following surgery - Pre-operative lab work as pre-surgical testing - Prescriptions will be provided in hospital at time of discharge  R. Jaynie Bream, PA-C Orthopedic Surgery [facility] [place]

## 2022-06-29 ENCOUNTER — Encounter (HOSPITAL_COMMUNITY)
Admission: RE | Admit: 2022-06-29 | Discharge: 2022-06-29 | Disposition: A | Discharge status: Home / Self Care | Payer: Medicare Other | Source: Ambulatory Visit | Attending: Orthopedic Surgery | Admitting: Orthopedic Surgery

## 2022-06-29 ENCOUNTER — Encounter (HOSPITAL_COMMUNITY): Payer: Self-pay

## 2022-06-29 ENCOUNTER — Other Ambulatory Visit: Payer: Self-pay

## 2022-06-29 ENCOUNTER — Telehealth: Payer: Self-pay

## 2022-06-29 ENCOUNTER — Encounter (HOSPITAL_COMMUNITY): Payer: Self-pay | Admitting: Orthopedic Surgery

## 2022-06-29 VITALS — BP 137/71 | HR 61 | Temp 97.8°F | Resp 16 | Ht 69.5 in | Wt 174.0 lb

## 2022-06-29 DIAGNOSIS — Z01812 Encounter for preprocedural laboratory examination: Secondary | ICD-10-CM | POA: Insufficient documentation

## 2022-06-29 DIAGNOSIS — Z01818 Encounter for other preprocedural examination: Secondary | ICD-10-CM

## 2022-06-29 HISTORY — DX: Acute myocardial infarction, unspecified: I21.9

## 2022-06-29 HISTORY — DX: Malignant (primary) neoplasm, unspecified: C80.1

## 2022-06-29 HISTORY — DX: Pneumonia, unspecified organism: J18.9

## 2022-06-29 HISTORY — DX: Unspecified osteoarthritis, unspecified site: M19.90

## 2022-06-29 LAB — CBC
HCT: 40.9 % (ref 39.0–52.0)
Hemoglobin: 13.7 g/dL (ref 13.0–17.0)
MCH: 32.7 pg (ref 26.0–34.0)
MCHC: 33.5 g/dL (ref 30.0–36.0)
MCV: 97.6 fL (ref 80.0–100.0)
Platelets: 252 10*3/uL (ref 150–400)
RBC: 4.19 MIL/uL — ABNORMAL LOW (ref 4.22–5.81)
RDW: 13.1 % (ref 11.5–15.5)
WBC: 7.6 10*3/uL (ref 4.0–10.5)
nRBC: 0 % (ref 0.0–0.2)

## 2022-06-29 LAB — SURGICAL PCR SCREEN
MRSA, PCR: NEGATIVE
Staphylococcus aureus: NEGATIVE

## 2022-06-29 LAB — BASIC METABOLIC PANEL
Anion gap: 6 (ref 5–15)
BUN: 23 mg/dL (ref 8–23)
CO2: 23 mmol/L (ref 22–32)
Calcium: 9.6 mg/dL (ref 8.9–10.3)
Chloride: 113 mmol/L — ABNORMAL HIGH (ref 98–111)
Creatinine, Ser: 1.09 mg/dL (ref 0.61–1.24)
GFR, Estimated: >60 mL/min (ref >60)
Glucose, Bld: 116 mg/dL — ABNORMAL HIGH (ref 70–99)
Potassium: 4.2 mmol/L (ref 3.5–5.1)
Sodium: 142 mmol/L (ref 135–145)

## 2022-06-29 NOTE — Telephone Encounter (Signed)
Patient is scheduled for tele visit on 07/06/22. Med rec and consent done

## 2022-06-29 NOTE — Telephone Encounter (Signed)
Patient with diagnosis of atrial fibrillation on Xarelto for anticoagulation.    Procedure:  Left total knee arthoplasty    Date of Surgery:  Clearance 07/11/22     CHA2DS2-VASc Score = 5   This indicates a 7.2% annual risk of stroke. The patient's score is based upon: CHF History: 1 HTN History: 1 Diabetes History: 0 Stroke History: 0 Vascular Disease History: 1 Age Score: 2 Gender Score: 0    CrCl 60 Platelet count 252  Per office protocol, patient can hold Xarelto for 3 days prior to procedure.   Patient will not need bridging with Lovenox (enoxaparin) around procedure.  **This guidance is not considered finalized until pre-operative APP has relayed final recommendations.**

## 2022-06-29 NOTE — Telephone Encounter (Signed)
  Patient Consent for Virtual Visit         Ivan Mccoy has provided verbal consent on 06/29/2022 for a virtual visit (video or telephone).   CONSENT FOR VIRTUAL VISIT FOR:  Ivan Mccoy  By participating in this virtual visit I agree to the following:  I hereby voluntarily request, consent and authorize [facility] and its employed or contracted physicians, physician assistants, nurse practitioners or other licensed health care professionals (the Practitioner), to provide me with telemedicine health care services (the "Services") as deemed necessary by the treating Practitioner. I acknowledge and consent to receive the Services by the Practitioner via telemedicine. I understand that the telemedicine visit will involve communicating with the Practitioner through live audiovisual communication technology and the disclosure of certain medical information by electronic transmission. I acknowledge that I have been given the opportunity to request an in-person assessment or other available alternative prior to the telemedicine visit and am voluntarily participating in the telemedicine visit.  I understand that I have the right to withhold or withdraw my consent to the use of telemedicine in the course of my care at any time, without affecting my right to future care or treatment, and that the Practitioner or I may terminate the telemedicine visit at any time. I understand that I have the right to inspect all information obtained and/or recorded in the course of the telemedicine visit and may receive copies of available information for a reasonable fee.  I understand that some of the potential risks of receiving the Services via telemedicine include:  Delay or interruption in medical evaluation due to technological equipment failure or disruption; Information transmitted may not be sufficient (e.g. poor resolution of images) to allow for appropriate medical decision making by the Practitioner;  and/or  In rare instances, security protocols could fail, causing a breach of personal health information.  Furthermore, I acknowledge that it is my responsibility to provide information about my medical history, conditions and care that is complete and accurate to the best of my ability. I acknowledge that Practitioner's advice, recommendations, and/or decision may be based on factors not within their control, such as incomplete or inaccurate data provided by me or distortions of diagnostic images or specimens that may result from electronic transmissions. I understand that the practice of medicine is not an exact science and that Practitioner makes no warranties or guarantees regarding treatment outcomes. I acknowledge that a copy of this consent can be made available to me via my patient portal ([facility]), or I can request a printed copy by calling the office of [facility].    I understand that my insurance will be billed for this visit.   I have read or had this consent read to me. I understand the contents of this consent, which adequately explains the benefits and risks of the Services being provided via telemedicine.  I have been provided ample opportunity to ask questions regarding this consent and the Services and have had my questions answered to my satisfaction. I give my informed consent for the services to be provided through the use of telemedicine in my medical care

## 2022-06-29 NOTE — Telephone Encounter (Signed)
   Name: Ivan Mccoy  DOB: January 16, 1936  MRN: [id number]  Primary Cardiologist: Peter Martinique, MD   Preoperative team, please contact this patient and set up a phone call appointment for further preoperative risk assessment. Please obtain consent and complete medication review. Thank you for your help.  I confirm that guidance regarding antiplatelet and oral anticoagulation therapy has been completed and, if necessary, noted below.  Per Pharm D: Patient with diagnosis of atrial fibrillation on Xarelto for anticoagulation.     Procedure:  Left total knee arthoplasty    Date of Surgery:  Clearance 07/11/22       CHA2DS2-VASc Score = 5   This indicates a 7.2% annual risk of stroke. The patient's score is based upon: CHF History: 1 HTN History: 1 Diabetes History: 0 Stroke History: 0 Vascular Disease History: 1 Age Score: 2 Gender Score: 0     CrCl 60 Platelet count 252   Per office protocol, patient can hold Xarelto for 3 days prior to procedure.   Patient will not need bridging with Lovenox (enoxaparin) around procedure.     Mayra Reel, NP 06/29/2022, 4:41 PM [place]

## 2022-06-29 NOTE — Telephone Encounter (Signed)
   Pre-operative Risk Assessment    Patient Name: Ivan Mccoy  DOB: 06-01-1935 MRN: [id number]     Request for Surgical Clearance    Procedure:  Left total knee arthoplasty   Date of Surgery:  Clearance 07/11/22                                 Surgeon:  Dr. Gaynelle Arabian  Surgeon's Group or Practice Name:  [facility]  Phone number:  [phone] Fax number:  [phone]   Type of Clearance Requested:   - Pharmacy:  Hold Rivaroxaban (Xarelto)     Type of Anesthesia:  Choice    Additional requests/questions:    [name]   06/29/2022, 11:23 AM

## 2022-07-04 NOTE — Progress Notes (Unsigned)
Virtual Visit via Telephone Note   Because of Ivan Mccoy's co-morbid illnesses, he is at least at moderate risk for complications without adequate follow up.  This format is felt to be most appropriate for this patient at this time.  The patient did not have access to video technology/had technical difficulties with video requiring transitioning to audio format only (telephone).  All issues noted in this document were discussed and addressed.  No physical exam could be performed with this format.  Please refer to the patient's chart for his consent to telehealth for [facility].  Evaluation Performed:  Preoperative cardiovascular risk assessment _____________   Date:  07/04/2022   Patient ID:  Ivan Mccoy, DOB [birth year], MRN [id number] Patient Location:  Home Provider location:   Office  Primary Care Provider:  [place] Primary Cardiologist:  Peter Martinique, MD  Chief Complaint / Patient Profile   87 y.o. y/o male with a h/o CAD with three-vessel obstructive CAD with predominant branch vessel disease including first and second diagonal, PL OM and RV marginal branches of the RCA, LCx appeared to be culprit lesion with successful stenting of mid to distal LCx with DES on cath 08/2015, PAF on chronic anticoagulation s/p ablation September 2013, OSA on CPAP, hypertension, hyperlipidemia, severe MR with severe prolapse of the A2 scallop with attempted MVP repair but ultimately MV replacement with pericardial valve 12/2015 who is pending left total knee arthroplasty and presents today for telephonic preoperative cardiovascular risk assessment.  History of Present Illness    Ivan Mccoy is a 87 y.o. male who presents via audio/video conferencing for a telehealth visit today.  Pt was last seen in cardiology clinic on 03/21/2022 by Dr. Martinique.  At that time Ivan Mccoy was doing well.  The patient is now pending procedure as outlined above. Since his last visit,  he denies chest pain, shortness of breath, lower extremity edema, fatigue, palpitations, melena, hematuria, hemoptysis, diaphoresis, weakness, presyncope, syncope, orthopnea, and PND. He has been using a cane to provide additional support secondary to knee pain. He can achieve > 4 METS activity by doing water aerobics and activities ADL.   Past Medical History    Past Medical History:  Diagnosis Date   Arrhythmia    paroxysmal afib   Arthritis    Cancer ([place])    skin cancer   CHF (congestive heart failure) (HCC)    Coronary artery disease    Heart murmur    Hyperlipidemia    Hypertension    Macular degeneration    Myocardial infarction ([place])    Obstructive sleep apnea    cpap   Pneumonia    hx of   Wet age-related macular degeneration of both eyes with inactive choroidal neovascularization ([place])    followed by DR Ernst Breach   Past Surgical History:  Procedure Laterality Date   APPENDECTOMY     CARDIAC CATHETERIZATION N/A 08/31/2015   Procedure: Left Heart Cath and Coronary Angiography;  Surgeon: Peter M Martinique, MD;  Location: [facility];  Service: Cardiovascular;  Laterality: N/A;   CARDIAC CATHETERIZATION N/A 08/31/2015   Procedure: Coronary Stent Intervention;  Surgeon: Peter M Martinique, MD;  Location: [facility];  Service: Cardiovascular;  Laterality: N/A;   CORONARY STENT INTERVENTION     excision of squamous cell ca Right    cheek   HEMATOMA EVACUATION     in left leg - 2012   HERNIA REPAIR     meniscus  tear surgery      MITRAL VALVE REPLACEMENT  12/21/2015   #27 Carpentier Edwards pericardial valve.    PROSTATE BIOPSY     TONSILLECTOMY AND ADENOIDECTOMY      Allergies  Allergies  Allergen Reactions   Other Anaphylaxis    Hummus NO GARBANZO BEANS!!!   Peanut Butter Flavor Anaphylaxis   Peanut Oil Anaphylaxis    deadly   Peanut-Containing Drug Products Anaphylaxis   Sulfa Antibiotics Other (See Comments)    PATIENT WAS (PERHAPS?) ALLERGIC  TO THIS CLASS OF MEDS AS A TEENAGER   Eliquis [Apixaban] Rash   Spironolactone Rash    Home Medications    Prior to Admission medications   Medication Sig Start Date End Date Taking? Authorizing Provider  acetaminophen (TYLENOL) 500 MG tablet Take 500-1,000 mg by mouth every 6 (six) hours as needed (pain.).    [provider]  amLODipine (NORVASC) 10 MG tablet TAKE ONE (1) TABLET BY MOUTH EACH DAY Patient taking differently: Take 10 mg by mouth every evening. 03/17/22   Martinique, Peter M, MD  atorvastatin (LIPITOR) 40 MG tablet TAKE 1 TABLET BY MOUTH DAILY Patient taking differently: Take 40 mg by mouth every evening. 03/30/22   Martinique, Peter M, MD  carvedilol (COREG) 25 MG tablet TAKE ONE TABLET BY MOUTH TWICE DAILY 05/11/22   Martinique, Peter M, MD  Cholecalciferol (VITAMIN D3) 25 MCG (1000 UT) CAPS Take 1,000 Units by mouth 3 (three) times daily.    [provider]  EPINEPHRINE 0.3 mg/0.3 mL IJ SOAJ injection INJECT INTO MUSCLE ONCE FOR ALLERGIC REACTION 09/29/20   Martinique, Peter M, MD  eplerenone (INSPRA) 50 MG tablet TAKE 1 TABLET BY MOUTH DAILY 11/01/21   Martinique, Peter M, MD  fenofibrate 160 MG tablet TAKE ONE (1) TABLET BY MOUTH EVERY DAY 05/11/22   Martinique, Peter M, MD  furosemide (LASIX) 20 MG tablet TAKE ONE (1) TABLET BY MOUTH EVERY DAY 12/20/21   Martinique, Peter M, MD  GLUCOSAMINE HCL PO Take 1 capsule by mouth 3 (three) times daily.    [provider]  isosorbide mononitrate (IMDUR) 30 MG 24 hr tablet TAKE 1 TABLET BY MOUTH DAILY 12/20/21   Martinique, Peter M, MD  metroNIDAZOLE (METROCREAM) 0.75 % cream Apply 1 application topically every morning. APPLY TO FACE    [provider]  Multiple Vitamin (MULTIVITAMIN WITH MINERALS) TABS tablet Take 1 tablet by mouth in the morning. Centrum Silver    [provider]  Multiple Vitamins-Minerals (PRESERVISION AREDS) TABS Take 1 tablet by mouth in the morning and at bedtime.    [provider]  potassium  chloride SA (KLOR-CON M) 20 MEQ tablet TAKE 1 TABLET BY MOUTH DAILY 12/20/21   Martinique, Peter M, MD  Probiotic Product (PROBIOTIC DAILY) CAPS Take 1 capsule by mouth at bedtime.    [provider]  Psyllium (METAMUCIL FIBER PO) Take 1 Dose by mouth every morning. powder    [provider]  rivaroxaban (XARELTO) 20 MG TABS tablet TAKE 1 TABLET BY MOUTH DAILY WITH SUPPER 03/17/22   Martinique, Peter M, MD  valsartan (DIOVAN) 320 MG tablet TAKE ONE (1) TABLET BY MOUTH EACH DAY 03/17/22   Martinique, Peter M, MD    Physical Exam    Vital Signs:  Ivan Mccoy does not have vital signs available for review today.  Given telephonic nature of communication, physical exam is limited. AAOx3. NAD. Normal affect.  Speech and respirations are unlabored.  Accessory Clinical Findings  None  Assessment & Plan    1.  Preoperative Cardiovascular Risk Assessment: According to the Revised Cardiac Risk Index (RCRI), his Perioperative Risk of Major Cardiac Event is (%): 6.6. His Functional Capacity in METs is: 5.07 according to the [facility] Activity Status Index (DASI). The patient is doing well from a cardiac perspective. Therefore, based on ACC/AHA guidelines, the patient would be at acceptable risk for the planned procedure without further cardiovascular testing.   The patient was advised that if he develops new symptoms prior to surgery to contact our office to arrange for a follow-up visit, and he verbalized understanding.  Per office protocol, patient can hold Xarelto for 2-3 days prior to procedure. Patient will not need bridging with Lovenox (enoxaparin) around procedure.  A copy of this note will be routed to requesting surgeon.  Time:   Today, I have spent 7 minutes with the patient with telehealth technology discussing medical history, symptoms, and management plan.    Emmaline Life, NP-C  07/06/2022, 9:37 AM [address], [address] Office [phone] Fax [phone]

## 2022-07-06 ENCOUNTER — Ambulatory Visit: Payer: Medicare Other | Attending: Internal Medicine | Admitting: Nurse Practitioner

## 2022-07-06 ENCOUNTER — Encounter: Payer: Self-pay | Admitting: Nurse Practitioner

## 2022-07-06 DIAGNOSIS — Z01810 Encounter for preprocedural cardiovascular examination: Secondary | ICD-10-CM

## 2022-07-07 NOTE — Progress Notes (Signed)
Anesthesia Chart Review:   Case: [id number] Date/Time: 07/11/22 0805   Procedure: TOTAL KNEE ARTHROPLASTY (Left: Knee)   Anesthesia type: Choice   Pre-op diagnosis: left knee osteoarthritis   Location: [facility] ROOM 10 / [facility]   Surgeons: Gaynelle Arabian, MD       DISCUSSION: Pt is 87 years old with hx CAD (2017 cath showed severe 3 vessel disease in D1, D2, and PLOM and RV marginal branches of the RCA, and CX; s/p DES to CX), PAF (s/p ablation 2013), severe MR s/p MVR 2017, moderate pulmonic valve regurgitation, mild aortic valve regurgitation, HTN, OSA  Echo 02/12/21 showed "evidence of some patient prosthesis mismatch without significant mitral gradients and stenosis. There is evidence of some degree of pathologic regurgitation not well visualized". Per Dr. Doug Sou notes, unless pt becomes more symptomatic, recommend f/u echo in 2 years.   Pt to hold xarelto 2-3 days before surgery per cardiology clearance. I spoke with Claiborne Billings in Dr. Anne Fu office and advised he hold it 3 days before surgery is spinal anesthesia is desired. She will call him and let him know to have his last dose xarelto be 07/07/22  VS: BP 137/71   Pulse 61   Temp 36.6 C (Oral)   Resp 16   Ht 5' 9.5" (1.765 m)   Wt 78.9 kg   SpO2 99%   BMI 25.33 kg/m   PROVIDERS: - Primary care at [facility], [place] is Peter Martinique, MD. Last office visit 03/21/22. Cleared for surgery at acceptable risk at last virtual visit with Christen Bame, NP  LABS: Labs reviewed: Acceptable for surgery. - HbA1c 06/29/22 was 5.4  (all labs ordered are listed, but only abnormal results are displayed)  Labs Reviewed  CBC - Abnormal; Notable for the following components:      Result Value   RBC 4.19 (*)    All other components within normal limits  BASIC METABOLIC PANEL - Abnormal; Notable for the following components:   Chloride 113 (*)    Glucose, Bld 116 (*)    All other components within normal limits   SURGICAL PCR SCREEN    EKG 09/20/21: SR with 1st degree AV block. Incomplete RBBB. LAFB   CV: Echo 02/12/21:  1. The mitral valve has been replaced with a 27 mm Carpenteir Edwards Pericardial Valve.  2. Prothestic valve parameters: Pressure Halte time 106 ms, TVI 3, EOA 1.4 cm2, mean gradient 3. There is evidence of some patient prosthesis mismatch without significant mitral gradients and stenosis. There is evidence of some degree of pathologic regurgitation not well visualized by color Doppler. In the setting of multiple valve disease (aortic, pulmonic, mitral), consider TEE for further assessment, or earlier screening TTE follow up.  3. Pulmonic valve regurgitation is moderate.  4. Left ventricular ejection fraction, by estimation, is 55 to 60%. Left ventricular ejection fraction by 3D volume is 55 %. The left ventricle has normal function. The left ventricle has no regional wall motion abnormalities. Left ventricular diastolic parameters are indeterminate.  5. Right ventricular systolic function is normal. The right ventricular size is normal. There is normal pulmonary artery systolic pressure.  6. Left atrial size was moderately dilated.  7. The aortic valve was not well visualized. Aortic valve regurgitation is mild. No aortic stenosis is present.   Cardiac cath 08/31/15:  Dist RCA lesion, 100% stenosed. RPDA lesion, 40% stenosed. Acute Mrg lesion, 99% stenosed. Ost 1st Diag lesion, 80% stenosed. Ost 2nd Diag to 2nd Diag  lesion, 90% stenosed. There is mild left ventricular systolic dysfunction. Prox Cx to Mid Cx lesion, 99% stenosed. Post intervention, there is a 0% residual stenosis. 1. 3 vessel obstructive CAD with predominant branch vessel disease. This includes the first and second diagonal, PLOM and RV marginal branches of the RCA. The LCx lesion appears to be the culprit vessel 2. Mild LV dysfunction with elevated LV EDP 3. Successful stenting of the mid-distal LCx with DES.     Past Medical History:  Diagnosis Date   Arrhythmia    paroxysmal afib   Arthritis    Cancer ([place])    skin cancer   CHF (congestive heart failure) ([facility])    Coronary artery disease    Heart murmur    Hyperlipidemia    Hypertension    Macular degeneration    Myocardial infarction ([place])    Obstructive sleep apnea    cpap   Pneumonia    hx of   Wet age-related macular degeneration of both eyes with inactive choroidal neovascularization ([place])    followed by DR Ernst Breach    Past Surgical History:  Procedure Laterality Date   APPENDECTOMY     CARDIAC CATHETERIZATION N/A 08/31/2015   Procedure: Left Heart Cath and Coronary Angiography;  Surgeon: Peter M Martinique, MD;  Location: [facility];  Service: Cardiovascular;  Laterality: N/A;   CARDIAC CATHETERIZATION N/A 08/31/2015   Procedure: Coronary Stent Intervention;  Surgeon: Peter M Martinique, MD;  Location: [facility];  Service: Cardiovascular;  Laterality: N/A;   CORONARY STENT INTERVENTION     excision of squamous cell ca Right    cheek   HEMATOMA EVACUATION     in left leg - 2012   HERNIA REPAIR     meniscus tear surgery      MITRAL VALVE REPLACEMENT  12/21/2015   #27 Carpentier Edwards pericardial valve.    PROSTATE BIOPSY     TONSILLECTOMY AND ADENOIDECTOMY      MEDICATIONS:  acetaminophen (TYLENOL) 500 MG tablet   amLODipine (NORVASC) 10 MG tablet   atorvastatin (LIPITOR) 40 MG tablet   carvedilol (COREG) 25 MG tablet   Cholecalciferol (VITAMIN D3) 25 MCG (1000 UT) CAPS   EPINEPHRINE 0.3 mg/0.3 mL IJ SOAJ injection   eplerenone (INSPRA) 50 MG tablet   fenofibrate 160 MG tablet   furosemide (LASIX) 20 MG tablet   GLUCOSAMINE HCL PO   isosorbide mononitrate (IMDUR) 30 MG 24 hr tablet   metroNIDAZOLE (METROCREAM) 0.75 % cream   Multiple Vitamin (MULTIVITAMIN WITH MINERALS) TABS tablet   Multiple Vitamins-Minerals (PRESERVISION AREDS) TABS   potassium chloride SA (KLOR-CON M) 20 MEQ tablet    Probiotic Product (PROBIOTIC DAILY) CAPS   Psyllium (METAMUCIL FIBER PO)   rivaroxaban (XARELTO) 20 MG TABS tablet   valsartan (DIOVAN) 320 MG tablet   No current facility-administered medications for this encounter.    If no changes, I anticipate pt can proceed with surgery as scheduled.   Willeen Cass, PhD, FNP-BC [facility]/Anesthesiology Phone: [phone] 07/07/2022 9:01 AM

## 2022-07-07 NOTE — Anesthesia Preprocedure Evaluation (Addendum)
Anesthesia Evaluation  Patient identified by MRN, date of birth, ID band Patient awake    Reviewed: Allergy & Precautions, NPO status , Patient's Chart, lab work & pertinent test results, reviewed documented beta blocker date and time   Airway Mallampati: II       Dental no notable dental hx. (+) Dental Advisory Given   Pulmonary sleep apnea and Continuous Positive Airway Pressure Ventilation , pneumonia, resolved   Pulmonary exam normal breath sounds clear to auscultation       Cardiovascular hypertension, Pt. on medications and Pt. on home beta blockers + angina with exertion + CAD, + Past MI, + Cardiac Stents and +CHF  Normal cardiovascular exam+ dysrhythmias Atrial Fibrillation + Valvular Problems/Murmurs MR  Rhythm:Regular Rate:Normal  EKG 09/20/21 NSR, 1st Deg AV block, LAD, Incomplete RBBB pattern, LAFB  Echo 02/12/21  1. The mitral valve has been replaced with a 27 mm Carpenteir Edwards  Pericardial Valve.   2. Prothestic valve parameters: Pressure Halte time 106 ms, TVI 3, EOA  1.4 cm2, mean gradient 3. There is evidence of some patient prosthesis  mismatch without significant mitral gradients and stenosis. There is  evidence of some degree of pathologic  regurgitation not well visualized by color Doppler. In the setting of  multiple valve disease (aortic, pulmonic, mitral), consider TEE for  further assessment, or earlier screening TTE follow up.   3. Pulmonic valve regurgitation is moderate.   4. Left ventricular ejection fraction, by estimation, is 55 to 60%. Left  ventricular ejection fraction by 3D volume is 55 %. The left ventricle has  normal function. The left ventricle has no regional wall motion  abnormalities. Left ventricular diastolic   parameters are indeterminate.   5. Right ventricular systolic function is normal. The right ventricular  size is normal. There is normal pulmonary artery systolic pressure.    6. Left atrial size was moderately dilated.   7. The aortic valve was not well visualized. Aortic valve regurgitation  is mild. No aortic stenosis is present.   S/P MVR 12/2015  Cardiac Cath 08/31/15  Dist RCA lesion, 100% stenosed.  RPDA lesion, 40% stenosed.  Acute Mrg lesion, 99% stenosed.  Ost 1st Diag lesion, 80% stenosed.  Ost 2nd Diag to 2nd Diag lesion, 90% stenosed.  There is mild left ventricular systolic dysfunction.  Prox Cx to Mid Cx lesion, 99% stenosed. Post intervention, there is a 0% residual stenosis.   1. 3 vessel obstructive CAD with predominant branch vessel disease. This includes the first and second diagonal, PLOM and RV marginal branches of the RCA. The LCx lesion appears to be the culprit vessel 2. Mild LV dysfunction with elevated LV EDP 3. Successful stenting of the mid-distal LCx with DES.     Neuro/Psych negative neurological ROS  negative psych ROS   GI/Hepatic negative GI ROS, Neg liver ROS,,,  Endo/Other  Hyperlipidemia  Renal/GU negative Renal ROS  negative genitourinary   Musculoskeletal  (+) Arthritis , Osteoarthritis,  OA left knee   Abdominal   Peds  Hematology  (+) Blood dyscrasia Xarelto therapy- last dose 3/28   Anesthesia Other Findings   Reproductive/Obstetrics                             Anesthesia Physical Anesthesia Plan  ASA: 3  Anesthesia Plan: Spinal   Post-op Pain Management: Regional block* and Minimal or no pain anticipated   Induction: Intravenous  PONV Risk Score and Plan:  2 and Treatment may vary due to age or medical condition and Propofol infusion  Airway Management Planned: Natural Airway and Simple Face Mask  Additional Equipment: None  Intra-op Plan:   Post-operative Plan:   Informed Consent: I have reviewed the patients History and Physical, chart, labs and discussed the procedure including the risks, benefits and alternatives for the proposed anesthesia with  the patient or authorized representative who has indicated his/her understanding and acceptance.     Dental advisory given  Plan Discussed with: CRNA and Anesthesiologist  Anesthesia Plan Comments: (See APP note by Durel Salts, FNP )       Anesthesia Quick Evaluation

## 2022-07-08 ENCOUNTER — Encounter (HOSPITAL_COMMUNITY): Payer: Self-pay | Admitting: Orthopedic Surgery

## 2022-07-11 ENCOUNTER — Other Ambulatory Visit: Payer: Self-pay

## 2022-07-11 ENCOUNTER — Encounter (HOSPITAL_COMMUNITY)
Admission: RE | Disposition: A | Discharge status: Home / Self Care | Payer: Self-pay | Source: Ambulatory Visit | Attending: Orthopedic Surgery

## 2022-07-11 ENCOUNTER — Observation Stay (HOSPITAL_COMMUNITY)
Admission: RE | Admit: 2022-07-11 | Discharge: 2022-07-12 | Disposition: A | Discharge status: Home / Self Care | Payer: Medicare Other | Source: Ambulatory Visit | Attending: Orthopedic Surgery | Admitting: Orthopedic Surgery

## 2022-07-11 ENCOUNTER — Encounter (HOSPITAL_COMMUNITY): Payer: Self-pay | Admitting: Orthopedic Surgery

## 2022-07-11 ENCOUNTER — Ambulatory Visit (HOSPITAL_COMMUNITY): Payer: Medicare Other | Admitting: Physician Assistant

## 2022-07-11 ENCOUNTER — Ambulatory Visit (HOSPITAL_BASED_OUTPATIENT_CLINIC_OR_DEPARTMENT_OTHER): Payer: Medicare Other | Admitting: Anesthesiology

## 2022-07-11 DIAGNOSIS — Z9989 Dependence on other enabling machines and devices: Secondary | ICD-10-CM

## 2022-07-11 DIAGNOSIS — Z01818 Encounter for other preprocedural examination: Secondary | ICD-10-CM

## 2022-07-11 DIAGNOSIS — I509 Heart failure, unspecified: Secondary | ICD-10-CM | POA: Insufficient documentation

## 2022-07-11 DIAGNOSIS — M1712 Unilateral primary osteoarthritis, left knee: Secondary | ICD-10-CM

## 2022-07-11 DIAGNOSIS — Z85828 Personal history of other malignant neoplasm of skin: Secondary | ICD-10-CM | POA: Insufficient documentation

## 2022-07-11 DIAGNOSIS — E785 Hyperlipidemia, unspecified: Secondary | ICD-10-CM

## 2022-07-11 DIAGNOSIS — I110 Hypertensive heart disease with heart failure: Secondary | ICD-10-CM | POA: Insufficient documentation

## 2022-07-11 DIAGNOSIS — I480 Paroxysmal atrial fibrillation: Secondary | ICD-10-CM | POA: Diagnosis not present

## 2022-07-11 DIAGNOSIS — M179 Osteoarthritis of knee, unspecified: Secondary | ICD-10-CM | POA: Diagnosis present

## 2022-07-11 DIAGNOSIS — G4733 Obstructive sleep apnea (adult) (pediatric): Secondary | ICD-10-CM

## 2022-07-11 DIAGNOSIS — Z7901 Long term (current) use of anticoagulants: Secondary | ICD-10-CM | POA: Insufficient documentation

## 2022-07-11 DIAGNOSIS — I2510 Atherosclerotic heart disease of native coronary artery without angina pectoris: Secondary | ICD-10-CM | POA: Insufficient documentation

## 2022-07-11 DIAGNOSIS — Z91010 Allergy to peanuts: Secondary | ICD-10-CM | POA: Diagnosis not present

## 2022-07-11 HISTORY — PX: TOTAL KNEE ARTHROPLASTY: SHX125

## 2022-07-11 HISTORY — DX: Exudative age-related macular degeneration, bilateral, with inactive choroidal neovascularization: H35.3232

## 2022-07-11 SURGERY — ARTHROPLASTY, KNEE, TOTAL
Anesthesia: Spinal | Site: Knee | Laterality: Left

## 2022-07-11 MED ORDER — LIDOCAINE HCL (PF) 2 % IJ SOLN
INTRAMUSCULAR | Status: AC
  Filled 2022-07-11: qty 5

## 2022-07-11 MED ORDER — AMLODIPINE BESYLATE 10 MG PO TABS: 10.0000 mg | ORAL_TABLET | Freq: Every evening | ORAL | Status: DC

## 2022-07-11 MED ORDER — BUPIVACAINE LIPOSOME 1.3 % IJ SUSP
INTRAMUSCULAR | Status: AC
  Filled 2022-07-11: qty 20

## 2022-07-11 MED ORDER — MENTHOL 3 MG MT LOZG: 1.0000 | LOZENGE | OROMUCOSAL | Status: DC | PRN

## 2022-07-11 MED ORDER — FENOFIBRATE 160 MG PO TABS
160.0000 mg | ORAL_TABLET | Freq: Every day | ORAL | Status: DC
  Administered 2022-07-12: 160 mg via ORAL
  Filled 2022-07-11: qty 1

## 2022-07-11 MED ORDER — 0.9 % SODIUM CHLORIDE (POUR BTL) OPTIME
TOPICAL | Status: DC | PRN
  Administered 2022-07-11: 1000 mL

## 2022-07-11 MED ORDER — PHENYLEPHRINE 80 MCG/ML (10ML) SYRINGE FOR IV PUSH (FOR BLOOD PRESSURE SUPPORT)
PREFILLED_SYRINGE | INTRAVENOUS | Status: DC | PRN
  Administered 2022-07-11 (×3): 80 ug via INTRAVENOUS

## 2022-07-11 MED ORDER — DEXAMETHASONE SODIUM PHOSPHATE 10 MG/ML IJ SOLN
8.0000 mg | Freq: Once | INTRAMUSCULAR | Status: AC
  Administered 2022-07-11: 4 mg via INTRAVENOUS

## 2022-07-11 MED ORDER — CHLORHEXIDINE GLUCONATE 0.12 % MT SOLN
15.0000 mL | Freq: Once | OROMUCOSAL | Status: AC
  Administered 2022-07-11: 15 mL via OROMUCOSAL

## 2022-07-11 MED ORDER — SODIUM CHLORIDE (PF) 0.9 % IJ SOLN
INTRAMUSCULAR | Status: AC
  Filled 2022-07-11: qty 10

## 2022-07-11 MED ORDER — METHOCARBAMOL 500 MG PO TABS
500.0000 mg | ORAL_TABLET | Freq: Four times a day (QID) | ORAL | Status: DC | PRN
  Administered 2022-07-11: 500 mg via ORAL
  Filled 2022-07-11: qty 1

## 2022-07-11 MED ORDER — FENTANYL CITRATE PF 50 MCG/ML IJ SOSY
50.0000 ug | PREFILLED_SYRINGE | Freq: Once | INTRAMUSCULAR | Status: DC
  Filled 2022-07-11: qty 2

## 2022-07-11 MED ORDER — DOCUSATE SODIUM 100 MG PO CAPS
100.0000 mg | ORAL_CAPSULE | Freq: Two times a day (BID) | ORAL | Status: DC
  Administered 2022-07-11 – 2022-07-12 (×2): 100 mg via ORAL
  Filled 2022-07-11 (×2): qty 1

## 2022-07-11 MED ORDER — SODIUM CHLORIDE (PF) 0.9 % IJ SOLN
INTRAMUSCULAR | Status: AC
  Filled 2022-07-11: qty 50

## 2022-07-11 MED ORDER — HYDROMORPHONE HCL 1 MG/ML IJ SOLN: 0.2500 mg | INTRAMUSCULAR | Status: DC | PRN

## 2022-07-11 MED ORDER — ISOSORBIDE MONONITRATE ER 30 MG PO TB24
30.0000 mg | ORAL_TABLET | Freq: Every day | ORAL | Status: DC
  Administered 2022-07-12: 30 mg via ORAL
  Filled 2022-07-11: qty 1

## 2022-07-11 MED ORDER — SODIUM CHLORIDE (PF) 0.9 % IJ SOLN
INTRAMUSCULAR | Status: DC | PRN
  Administered 2022-07-11: 60 mL

## 2022-07-11 MED ORDER — BUPIVACAINE LIPOSOME 1.3 % IJ SUSP
INTRAMUSCULAR | Status: DC | PRN
  Administered 2022-07-11: 20 mL

## 2022-07-11 MED ORDER — PHENYLEPHRINE 80 MCG/ML (10ML) SYRINGE FOR IV PUSH (FOR BLOOD PRESSURE SUPPORT)
PREFILLED_SYRINGE | INTRAVENOUS | Status: AC
  Filled 2022-07-11: qty 10

## 2022-07-11 MED ORDER — SPIRONOLACTONE 25 MG PO TABS: 25.0000 mg | ORAL_TABLET | Freq: Every day | ORAL | Status: DC

## 2022-07-11 MED ORDER — SODIUM CHLORIDE 0.9 % IV SOLN: INTRAVENOUS | Status: DC

## 2022-07-11 MED ORDER — LACTATED RINGERS IV SOLN: INTRAVENOUS | Status: DC

## 2022-07-11 MED ORDER — ORAL CARE MOUTH RINSE: 15.0000 mL | Freq: Once | OROMUCOSAL | Status: AC

## 2022-07-11 MED ORDER — ATORVASTATIN CALCIUM 40 MG PO TABS: 40.0000 mg | ORAL_TABLET | Freq: Every evening | ORAL | Status: DC

## 2022-07-11 MED ORDER — DEXAMETHASONE SODIUM PHOSPHATE 10 MG/ML IJ SOLN
10.0000 mg | Freq: Once | INTRAMUSCULAR | Status: AC
  Administered 2022-07-12: 10 mg via INTRAVENOUS
  Filled 2022-07-11: qty 1

## 2022-07-11 MED ORDER — ACETAMINOPHEN 500 MG PO TABS
1000.0000 mg | ORAL_TABLET | Freq: Four times a day (QID) | ORAL | Status: AC
  Administered 2022-07-11 – 2022-07-12 (×3): 1000 mg via ORAL
  Filled 2022-07-11 (×3): qty 2

## 2022-07-11 MED ORDER — RIVAROXABAN 10 MG PO TABS
10.0000 mg | ORAL_TABLET | Freq: Every day | ORAL | Status: DC
  Administered 2022-07-12: 10 mg via ORAL
  Filled 2022-07-11: qty 1

## 2022-07-11 MED ORDER — PROPOFOL 10 MG/ML IV BOLUS
INTRAVENOUS | Status: AC
  Filled 2022-07-11: qty 20

## 2022-07-11 MED ORDER — HYDROMORPHONE HCL 1 MG/ML IJ SOLN: 0.5000 mg | INTRAMUSCULAR | Status: DC | PRN

## 2022-07-11 MED ORDER — EPHEDRINE 5 MG/ML INJ
INTRAVENOUS | Status: AC
  Filled 2022-07-11: qty 5

## 2022-07-11 MED ORDER — ACETAMINOPHEN 10 MG/ML IV SOLN
1000.0000 mg | Freq: Four times a day (QID) | INTRAVENOUS | Status: DC
  Administered 2022-07-11: 1000 mg via INTRAVENOUS
  Filled 2022-07-11: qty 100

## 2022-07-11 MED ORDER — OXYCODONE HCL 5 MG PO TABS: 5.0000 mg | ORAL_TABLET | Freq: Once | ORAL | Status: DC | PRN

## 2022-07-11 MED ORDER — CARVEDILOL 25 MG PO TABS
25.0000 mg | ORAL_TABLET | Freq: Two times a day (BID) | ORAL | Status: DC
  Administered 2022-07-11 – 2022-07-12 (×2): 25 mg via ORAL
  Filled 2022-07-11 (×2): qty 1

## 2022-07-11 MED ORDER — OXYCODONE HCL 5 MG/5ML PO SOLN: 5.0000 mg | Freq: Once | ORAL | Status: DC | PRN

## 2022-07-11 MED ORDER — ONDANSETRON HCL 4 MG/2ML IJ SOLN: 4.0000 mg | Freq: Four times a day (QID) | INTRAMUSCULAR | Status: DC | PRN

## 2022-07-11 MED ORDER — POLYETHYLENE GLYCOL 3350 17 G PO PACK: 17.0000 g | PACK | Freq: Every day | ORAL | Status: DC | PRN

## 2022-07-11 MED ORDER — FUROSEMIDE 20 MG PO TABS
20.0000 mg | ORAL_TABLET | Freq: Every day | ORAL | Status: DC
  Administered 2022-07-12: 20 mg via ORAL
  Filled 2022-07-11: qty 1

## 2022-07-11 MED ORDER — CEFAZOLIN SODIUM-DEXTROSE 2-4 GM/100ML-% IV SOLN
2.0000 g | Freq: Four times a day (QID) | INTRAVENOUS | Status: AC
  Administered 2022-07-11 (×2): 2 g via INTRAVENOUS
  Filled 2022-07-11 (×2): qty 100

## 2022-07-11 MED ORDER — ONDANSETRON HCL 4 MG/2ML IJ SOLN: 4.0000 mg | Freq: Once | INTRAMUSCULAR | Status: DC | PRN

## 2022-07-11 MED ORDER — DIPHENHYDRAMINE HCL 12.5 MG/5ML PO ELIX: 12.5000 mg | ORAL_SOLUTION | ORAL | Status: DC | PRN

## 2022-07-11 MED ORDER — ONDANSETRON HCL 4 MG/2ML IJ SOLN
INTRAMUSCULAR | Status: AC
  Filled 2022-07-11: qty 2

## 2022-07-11 MED ORDER — FENTANYL CITRATE (PF) 100 MCG/2ML IJ SOLN
INTRAMUSCULAR | Status: DC | PRN
  Administered 2022-07-11 (×2): 50 ug via INTRAVENOUS

## 2022-07-11 MED ORDER — LIDOCAINE 2% (20 MG/ML) 5 ML SYRINGE
INTRAMUSCULAR | Status: DC | PRN
  Administered 2022-07-11: 60 mg via INTRAVENOUS

## 2022-07-11 MED ORDER — IRBESARTAN 150 MG PO TABS
300.0000 mg | ORAL_TABLET | Freq: Every day | ORAL | Status: DC
  Administered 2022-07-12: 300 mg via ORAL
  Filled 2022-07-11 (×2): qty 2

## 2022-07-11 MED ORDER — PHENOL 1.4 % MT LIQD: 1.0000 | OROMUCOSAL | Status: DC | PRN

## 2022-07-11 MED ORDER — FLEET ENEMA 7-19 GM/118ML RE ENEM: 1.0000 | ENEMA | Freq: Once | RECTAL | Status: DC | PRN

## 2022-07-11 MED ORDER — PROPOFOL 1000 MG/100ML IV EMUL
INTRAVENOUS | Status: AC
  Filled 2022-07-11: qty 100

## 2022-07-11 MED ORDER — CEFAZOLIN SODIUM-DEXTROSE 2-4 GM/100ML-% IV SOLN
2.0000 g | INTRAVENOUS | Status: AC
  Administered 2022-07-11: 2 g via INTRAVENOUS
  Filled 2022-07-11: qty 100

## 2022-07-11 MED ORDER — TRANEXAMIC ACID-NACL 1000-0.7 MG/100ML-% IV SOLN
1000.0000 mg | INTRAVENOUS | Status: AC
  Administered 2022-07-11: 1000 mg via INTRAVENOUS
  Filled 2022-07-11: qty 100

## 2022-07-11 MED ORDER — BUPIVACAINE IN DEXTROSE 0.75-8.25 % IT SOLN
INTRATHECAL | Status: DC | PRN
  Administered 2022-07-11: 1.8 mL via INTRATHECAL

## 2022-07-11 MED ORDER — BUPIVACAINE LIPOSOME 1.3 % IJ SUSP: 20.0000 mL | Freq: Once | INTRAMUSCULAR | Status: DC

## 2022-07-11 MED ORDER — BISACODYL 10 MG RE SUPP: 10.0000 mg | Freq: Every day | RECTAL | Status: DC | PRN

## 2022-07-11 MED ORDER — TRAMADOL HCL 50 MG PO TABS: 50.0000 mg | ORAL_TABLET | Freq: Four times a day (QID) | ORAL | Status: DC | PRN

## 2022-07-11 MED ORDER — STERILE WATER FOR IRRIGATION IR SOLN
Status: DC | PRN
  Administered 2022-07-11: 2000 mL

## 2022-07-11 MED ORDER — EPHEDRINE SULFATE-NACL 50-0.9 MG/10ML-% IV SOSY
PREFILLED_SYRINGE | INTRAVENOUS | Status: DC | PRN
  Administered 2022-07-11: 5 mg via INTRAVENOUS
  Administered 2022-07-11: 10 mg via INTRAVENOUS
  Administered 2022-07-11 (×4): 5 mg via INTRAVENOUS
  Administered 2022-07-11: 10 mg via INTRAVENOUS
  Administered 2022-07-11: 5 mg via INTRAVENOUS

## 2022-07-11 MED ORDER — DEXAMETHASONE SODIUM PHOSPHATE 10 MG/ML IJ SOLN
INTRAMUSCULAR | Status: AC
  Filled 2022-07-11: qty 1

## 2022-07-11 MED ORDER — PROPOFOL 10 MG/ML IV BOLUS
INTRAVENOUS | Status: DC | PRN
  Administered 2022-07-11 (×2): 10 mg via INTRAVENOUS

## 2022-07-11 MED ORDER — ONDANSETRON HCL 4 MG/2ML IJ SOLN
INTRAMUSCULAR | Status: DC | PRN
  Administered 2022-07-11: 4 mg via INTRAVENOUS

## 2022-07-11 MED ORDER — METOCLOPRAMIDE HCL 5 MG/ML IJ SOLN: 5.0000 mg | Freq: Three times a day (TID) | INTRAMUSCULAR | Status: DC | PRN

## 2022-07-11 MED ORDER — ONDANSETRON HCL 4 MG PO TABS: 4.0000 mg | ORAL_TABLET | Freq: Four times a day (QID) | ORAL | Status: DC | PRN

## 2022-07-11 MED ORDER — POTASSIUM CHLORIDE CRYS ER 20 MEQ PO TBCR
20.0000 meq | EXTENDED_RELEASE_TABLET | Freq: Every day | ORAL | Status: DC
  Administered 2022-07-12: 20 meq via ORAL
  Filled 2022-07-11: qty 1

## 2022-07-11 MED ORDER — METOCLOPRAMIDE HCL 5 MG PO TABS: 5.0000 mg | ORAL_TABLET | Freq: Three times a day (TID) | ORAL | Status: DC | PRN

## 2022-07-11 MED ORDER — ROPIVACAINE HCL 5 MG/ML IJ SOLN
INTRAMUSCULAR | Status: DC | PRN
  Administered 2022-07-11: 30 mL via PERINEURAL

## 2022-07-11 MED ORDER — METHOCARBAMOL 500 MG IVPB - SIMPLE MED: 500.0000 mg | Freq: Four times a day (QID) | INTRAVENOUS | Status: DC | PRN

## 2022-07-11 MED ORDER — OXYCODONE HCL 5 MG PO TABS
5.0000 mg | ORAL_TABLET | ORAL | Status: DC | PRN
  Administered 2022-07-11 – 2022-07-12 (×5): 5 mg via ORAL
  Filled 2022-07-11 (×3): qty 1
  Filled 2022-07-11: qty 2
  Filled 2022-07-11: qty 1

## 2022-07-11 MED ORDER — PROPOFOL 500 MG/50ML IV EMUL
INTRAVENOUS | Status: DC | PRN
  Administered 2022-07-11: 75 ug/kg/min via INTRAVENOUS

## 2022-07-11 MED ORDER — SODIUM CHLORIDE 0.9 % IR SOLN
Status: DC | PRN
  Administered 2022-07-11: 3000 mL

## 2022-07-11 MED ORDER — POVIDONE-IODINE 10 % EX SWAB
2.0000 | Freq: Once | CUTANEOUS | Status: AC
  Administered 2022-07-11: 2 via TOPICAL

## 2022-07-11 SURGICAL SUPPLY — 62 items
ATTUNE MED DOME PAT 41 KNEE (Knees) IMPLANT
ATTUNE PS FEM LT SZ 7 CEM KNEE (Femur) IMPLANT
ATTUNE PSRP INSR SZ7 8 KNEE (Insert) IMPLANT
BAG COUNTER SPONGE SURGICOUNT (BAG) IMPLANT
BAG SPEC THK2 15X12 ZIP CLS (MISCELLANEOUS) ×1
BAG SPNG CNTER NS LX DISP (BAG) ×1
BAG ZIPLOCK 12X15 (MISCELLANEOUS) ×1 IMPLANT
BASE TIBIA ATTUNE KNEE SYS SZ6 (Knees) IMPLANT
BLADE SAG 18X100X1.27 (BLADE) ×1 IMPLANT
BLADE SAW SGTL 11.0X1.19X90.0M (BLADE) ×1 IMPLANT
BNDG CMPR 5X62 HK CLSR LF (GAUZE/BANDAGES/DRESSINGS) ×1
BNDG CMPR MED 10X6 ELC LF (GAUZE/BANDAGES/DRESSINGS) ×1
BNDG ELASTIC 6INX 5YD STR LF (GAUZE/BANDAGES/DRESSINGS) ×1 IMPLANT
BNDG ELASTIC 6X10 VLCR STRL LF (GAUZE/BANDAGES/DRESSINGS) IMPLANT
BOWL SMART MIX CTS (DISPOSABLE) ×1 IMPLANT
BSPLAT TIB 6 CMNT ROT PLAT STR (Knees) ×1 IMPLANT
CEMENT HV SMART SET (Cement) ×2 IMPLANT
CLSR STERI-STRIP ANTIMIC 1/2X4 (GAUZE/BANDAGES/DRESSINGS) IMPLANT
COVER SURGICAL LIGHT HANDLE (MISCELLANEOUS) ×1 IMPLANT
CUFF TOURN SGL QUICK 34 (TOURNIQUET CUFF) ×1
CUFF TRNQT CYL 34X4.125X (TOURNIQUET CUFF) ×1 IMPLANT
DRAPE INCISE IOBAN 66X45 STRL (DRAPES) ×1 IMPLANT
DRAPE U-SHAPE 47X51 STRL (DRAPES) ×1 IMPLANT
DRESSING AQUACEL AG SP 3.5X10 (GAUZE/BANDAGES/DRESSINGS) IMPLANT
DRSG AQUACEL AG ADV 3.5X10 (GAUZE/BANDAGES/DRESSINGS) ×1 IMPLANT
DRSG AQUACEL AG SP 3.5X10 (GAUZE/BANDAGES/DRESSINGS) ×1
DURAPREP 26ML APPLICATOR (WOUND CARE) ×1 IMPLANT
ELECT REM PT RETURN 15FT ADLT (MISCELLANEOUS) ×1 IMPLANT
GLOVE BIO SURGEON STRL SZ 6.5 (GLOVE) IMPLANT
GLOVE BIO SURGEON STRL SZ7.5 (GLOVE) IMPLANT
GLOVE BIO SURGEON STRL SZ8 (GLOVE) ×1 IMPLANT
GLOVE BIOGEL PI IND STRL 6.5 (GLOVE) IMPLANT
GLOVE BIOGEL PI IND STRL 7.0 (GLOVE) IMPLANT
GLOVE BIOGEL PI IND STRL 8 (GLOVE) ×1 IMPLANT
GOWN STRL REUS W/ TWL LRG LVL3 (GOWN DISPOSABLE) ×1 IMPLANT
GOWN STRL REUS W/ TWL XL LVL3 (GOWN DISPOSABLE) IMPLANT
GOWN STRL REUS W/TWL LRG LVL3 (GOWN DISPOSABLE) ×1
GOWN STRL REUS W/TWL XL LVL3 (GOWN DISPOSABLE)
HANDPIECE INTERPULSE COAX TIP (DISPOSABLE) ×1
HOLDER FOLEY CATH W/STRAP (MISCELLANEOUS) IMPLANT
IMMOBILIZER KNEE 20 (SOFTGOODS) ×1
IMMOBILIZER KNEE 20 THIGH 36 (SOFTGOODS) ×1 IMPLANT
KIT TURNOVER KIT A (KITS) IMPLANT
MANIFOLD NEPTUNE II (INSTRUMENTS) ×1 IMPLANT
NS IRRIG 1000ML POUR BTL (IV SOLUTION) ×1 IMPLANT
PACK TOTAL KNEE CUSTOM (KITS) ×1 IMPLANT
PADDING CAST COTTON 6X4 STRL (CAST SUPPLIES) ×2 IMPLANT
PIN STEINMAN FIXATION KNEE (PIN) IMPLANT
PROTECTOR NERVE ULNAR (MISCELLANEOUS) ×1 IMPLANT
SET HNDPC FAN SPRY TIP SCT (DISPOSABLE) ×1 IMPLANT
SPIKE FLUID TRANSFER (MISCELLANEOUS) ×1 IMPLANT
STRIP CLOSURE SKIN 1/2X4 (GAUZE/BANDAGES/DRESSINGS) ×2 IMPLANT
SUT MNCRL AB 4-0 PS2 18 (SUTURE) ×1 IMPLANT
SUT STRATAFIX 0 PDS 27 VIOLET (SUTURE) ×1
SUT VIC AB 2-0 CT1 27 (SUTURE) ×3
SUT VIC AB 2-0 CT1 TAPERPNT 27 (SUTURE) ×3 IMPLANT
SUTURE STRATFX 0 PDS 27 VIOLET (SUTURE) ×1 IMPLANT
TIBIA ATTUNE KNEE SYS BASE SZ6 (Knees) ×1 IMPLANT
TRAY FOLEY MTR SLVR 16FR STAT (SET/KITS/TRAYS/PACK) ×1 IMPLANT
TUBE SUCTION HIGH CAP CLEAR NV (SUCTIONS) ×1 IMPLANT
WATER STERILE IRR 1000ML POUR (IV SOLUTION) ×2 IMPLANT
WRAP KNEE MAXI GEL POST OP (GAUZE/BANDAGES/DRESSINGS) ×1 IMPLANT

## 2022-07-11 NOTE — Evaluation (Signed)
Physical Therapy Evaluation Patient Details Name: Ivan Mccoy MRN: [id number] DOB: 1935-07-02 Today's Date: 07/11/2022  History of Present Illness  87 yo male presents to therapy s/p L TKA on 07/11/2022 due to failure of conservative measures. Pt has PMH including but not limited to: CAD s/p stent, s and d CHF, NTSTEMI, A-fib, HDL, HTN, macular degeneration, and OSA.  Clinical Impression    Ivan Mccoy is a 87 y.o. male POD 0 s/p L TKA. Patient reports Mod I with SPC with mobility at baseline. Patient is now limited by functional impairments (see PT problem list below) and requires min guard and increased time  for bed mobility and min guard and cues for transfers. Patient was able to ambulate 68 feet with RW and min guard level of assist. Patient instructed in exercise to facilitate ROM and circulation to manage edema. Patient will benefit from continued skilled PT interventions to address impairments and progress towards PLOF. Acute PT will follow to progress mobility and stair training in preparation for safe discharge home to [facility] and pt reports will do therapy at facility as OP.       Recommendations for follow up therapy are one component of a multi-disciplinary discharge planning process, led by the attending physician.  Recommendations may be updated based on patient status, additional functional criteria and insurance authorization.  Follow Up Recommendations       Assistance Recommended at Discharge Intermittent Supervision/Assistance  Patient can return home with the following  A little help with walking and/or transfers;A little help with bathing/dressing/bathroom;Assistance with cooking/housework;Assist for transportation;Help with stairs or ramp for entrance    Equipment Recommendations Rolling walker (2 wheels);Other (comment) (pt is requesting iceman machine)  Recommendations for Other Services       Functional Status Assessment Patient has had a recent decline in  their functional status and demonstrates the ability to make significant improvements in function in a reasonable and predictable amount of time.     Precautions / Restrictions Precautions Precautions: Fall;Knee (HOH, B hearing aids) Restrictions Weight Bearing Restrictions: No      Mobility  Bed Mobility Overal bed mobility: Needs Assistance Bed Mobility: Supine to Sit     Supine to sit: Min guard     General bed mobility comments: HOB elevated and increased time    Transfers Overall transfer level: Needs assistance   Transfers: Sit to/from Stand Sit to Stand: Min guard           General transfer comment: cues for proper UE and AD placement    Ambulation/Gait Ambulation/Gait assistance: Min guard Gait Distance (Feet): 68 Feet Assistive device: Rolling walker (2 wheels) Gait Pattern/deviations: Step-to pattern, Ataxic, Wide base of support (B toe out) Gait velocity: decreased        Stairs            Wheelchair Mobility    Modified Rankin (Stroke Patients Only)       Balance Overall balance assessment: Needs assistance Sitting-balance support: Feet supported Sitting balance-Leahy Scale: Fair     Standing balance support: During functional activity, Reliant on assistive device for balance Standing balance-Leahy Scale: Poor                               Pertinent Vitals/Pain Pain Assessment Pain Assessment: 0-10 Pain Score: 3  Pain Location: L knee Pain Descriptors / Indicators: Throbbing Pain Intervention(s): Limited activity within patient's tolerance, Premedicated before session, Monitored during session,  Repositioned, Ice applied    Home Living Family/patient expects to be discharged to:: Private residence Living Arrangements: Spouse/significant other Available Help at Discharge: Family Type of Home: Independent living facility ([facility]) Home Access: Elevator         Home Equipment: Shower seat      Prior  Function Prior Level of Function : Independent/Modified Independent;Driving             Mobility Comments: Mod I with SPC with all ADLs, self care tasks, IADLs and driving       Hand Dominance        Extremity/Trunk Assessment        Lower Extremity Assessment Lower Extremity Assessment: LLE deficits/detail LLE Deficits / Details: ankle DF/PF 5/5: SLR < 10 degree lag LLE Sensation: WNL    Cervical / Trunk Assessment Cervical / Trunk Assessment: Normal  Communication   Communication: No difficulties  Cognition Arousal/Alertness: Awake/alert Behavior During Therapy: WFL for tasks assessed/performed Overall Cognitive Status: Within Functional Limits for tasks assessed                                          General Comments      Exercises Total Joint Exercises Ankle Circles/Pumps: AROM, Both, 20 reps   Assessment/Plan    PT Assessment Patient needs continued PT services  PT Problem List Decreased strength;Decreased range of motion;Decreased activity tolerance;Decreased balance;Decreased mobility;Decreased knowledge of use of DME;Pain       PT Treatment Interventions DME instruction;Gait training;Functional mobility training;Therapeutic activities;Therapeutic exercise;Balance training;Neuromuscular re-education;Patient/family education;Modalities    PT Goals (Current goals can be found in the Care Plan section)  Acute Rehab PT Goals Patient Stated Goal: walk without the cane PT Goal Formulation: With patient Time For Goal Achievement: 07/11/22 Potential to Achieve Goals: Good    Frequency 7X/week     Co-evaluation               AM-PAC PT "6 Clicks" Mobility  Outcome Measure Help needed turning from your back to your side while in a flat bed without using bedrails?: A Little Help needed moving from lying on your back to sitting on the side of a flat bed without using bedrails?: A Little Help needed moving to and from a bed to a  chair (including a wheelchair)?: A Little Help needed standing up from a chair using your arms (e.g., wheelchair or bedside chair)?: A Little Help needed to walk in hospital room?: A Little Help needed climbing 3-5 steps with a railing? : Total 6 Click Score: 16    End of Session Equipment Utilized During Treatment: Gait belt Activity Tolerance: No increased pain;Patient tolerated treatment well Patient left: in chair;with call bell/phone within reach;with family/visitor present Nurse Communication: Mobility status PT Visit Diagnosis: Unsteadiness on feet (R26.81);Other abnormalities of gait and mobility (R26.89);Muscle weakness (generalized) (M62.81);Pain Pain - Right/Left: Left Pain - part of body: Knee    Time: [id number] PT Time Calculation (min) (ACUTE ONLY): 36 min   Charges:   PT Evaluation $PT Eval Low Complexity: 1 Low PT Treatments $Gait Training: 8-22 mins        Baird Lyons, PT   Adair Patter 07/11/2022, 2:12 PM

## 2022-07-11 NOTE — Progress Notes (Signed)
Orthopedic Tech Progress Note Patient Details:  Ivan Mccoy [birth year] [id number]  CPM Left Knee CPM Left Knee: Off Left Knee Flexion (Degrees): 40 Left Knee Extension (Degrees): 10  Post Interventions Patient Tolerated: Well  Vernona Rieger 07/11/2022, 2:23 PM

## 2022-07-11 NOTE — Interval H&P Note (Signed)
History and Physical Interval Note:  07/11/2022 6:29 AM  Ivan Mccoy  has presented today for surgery, with the diagnosis of left knee osteoarthritis.  The various methods of treatment have been discussed with the patient and family. After consideration of risks, benefits and other options for treatment, the patient has consented to  Procedure(s): TOTAL KNEE ARTHROPLASTY (Left) as a surgical intervention.  The patient's history has been reviewed, patient examined, no change in status, stable for surgery.  I have reviewed the patient's chart and labs.  Questions were answered to the patient's satisfaction.     Pilar Plate Ivan Mccoy

## 2022-07-11 NOTE — Anesthesia Procedure Notes (Signed)
Spinal  Patient location during procedure: OR Start time: 07/11/2022 8:22 AM End time: 07/11/2022 8:24 AM Reason for block: surgical anesthesia Staffing Performed: anesthesiologist  Anesthesiologist: Josephine Igo, MD Performed by: Josephine Igo, MD Authorized by: Josephine Igo, MD   Preanesthetic Checklist Completed: patient identified, IV checked, site marked, risks and benefits discussed, surgical consent, monitors and equipment checked, pre-op evaluation and timeout performed Spinal Block Patient position: sitting Prep: DuraPrep and site prepped and draped Patient monitoring: heart rate, cardiac monitor, continuous pulse ox and blood pressure Approach: midline Location: L3-4 Injection technique: single-shot Needle Needle type: Pencan  Needle gauge: 24 G Needle length: 9 cm Needle insertion depth: 6 cm Assessment Sensory level: T6 Events: CSF return Additional Notes Patient tolerated procedure well. Adequate sensory level.

## 2022-07-11 NOTE — Progress Notes (Signed)
Orthopedic Tech Progress Note Patient Details:  Ivan Mccoy [birth year] [id number]  CPM Left Knee CPM Left Knee: On Left Knee Flexion (Degrees): 40 Left Knee Extension (Degrees): 10  Post Interventions Patient Tolerated: Well  Vernona Rieger 07/11/2022, 10:31 AM

## 2022-07-11 NOTE — Anesthesia Postprocedure Evaluation (Signed)
Anesthesia Post Note  Patient: Ivan Mccoy  Procedure(s) Performed: TOTAL KNEE ARTHROPLASTY (Left: Knee)     Patient location during evaluation: PACU Anesthesia Type: Spinal Level of consciousness: oriented and awake and alert Pain management: pain level controlled Vital Signs Assessment: post-procedure vital signs reviewed and stable Respiratory status: spontaneous breathing, respiratory function stable and nonlabored ventilation Cardiovascular status: blood pressure returned to baseline and stable Postop Assessment: no headache, no backache, no apparent nausea or vomiting and spinal receding Anesthetic complications: no   No notable events documented.  Last Vitals:  Vitals:   07/11/22 1015 07/11/22 1030  BP: 112/72 (!) 120/59  Pulse: (!) 53 (!) 54  Resp: 13 18  Temp:    SpO2: 93% 97%    Last Pain:  Vitals:   07/11/22 1030  TempSrc:   PainSc: 0-No pain                 Josha Weekley A.

## 2022-07-11 NOTE — Care Plan (Signed)
Ortho Bundle Case Management Note  Patient Details  Name: Ivan Mccoy MRN: [id number] Date of Birth: [birth year]                  L TKA on 07-11-22  DCP: Home with wife. [facility]  DME: [facility] ordered through [facility]  PT: [facility]   DME Arranged:  Gilford Rile rolling DME Agency:  [employer]    Additional Comments: Please contact me with any questions of if this plan should need to change.   Marianne Sofia, RN,CCM [facility]  [phone] 07/11/2022, 1:25 PM

## 2022-07-11 NOTE — Anesthesia Procedure Notes (Addendum)
  Anesthesia Regional Block: Adductor canal block   Pre-Anesthetic Checklist: , timeout performed,  Correct Patient, Correct Site, Correct Laterality,  Correct Procedure, Correct Position, site marked,  Risks and benefits discussed,  Surgical consent,  Pre-op evaluation,  At surgeon's request and post-op pain management  Laterality: Left  Prep: chloraprep       Needles:  Injection technique: Single-shot  Needle Type: Echogenic Stimulator Needle     Needle Length: 10cm  Needle Gauge: 21   Needle insertion depth: 6 cm   Additional Needles:   Procedures:,,,, ultrasound used (permanent image in chart),,    Narrative:  Start time: 07/11/2022 8:12 AM End time: 07/11/2022 8:17 AM Injection made incrementally with aspirations every 5 mL.  Performed by: Personally  Anesthesiologist: Josephine Igo, MD  Additional Notes: Patient tolerated procedure well. Adequate sensory level.

## 2022-07-11 NOTE — Op Note (Signed)
OPERATIVE REPORT-TOTAL KNEE ARTHROPLASTY   Pre-operative diagnosis- Osteoarthritis  Left knee(s)  Post-operative diagnosis- Osteoarthritis Left knee(s)  Procedure-  Left  Total Knee Arthroplasty  Surgeon- [name]. Dezyrae Kensinger, MD  Assistant- Theresa Duty, PA-C   Anesthesia-   Adductor canal block and spinal  EBL- 25 ml   Drains None  Tourniquet time-  Total Tourniquet Time Documented: Thigh (Left) - 32 minutes Total: Thigh (Left) - 32 minutes     Complications- None  Condition-PACU - hemodynamically stable.   Brief Clinical Note   Ivan Mccoy is a 87 y.o. year old male with end stage OA of his left knee with progressively worsening pain and dysfunction. He has constant pain, with activity and at rest and significant functional deficits with difficulties even with ADLs. He has had extensive non-op management including analgesics, injections of cortisone and viscosupplements, and home exercise program, but remains in significant pain with significant dysfunction. Radiographs show bone on bone arthritis medial and patellofemoral. He presents now for left Total Knee Arthroplasty.     Procedure in detail---   The patient is brought into the operating room and positioned supine on the operating table. After successful administration of  Adductor canal block and spinal,   a tourniquet is placed high on the  Left thigh(s) and the lower extremity is prepped and draped in the usual sterile fashion. Time out is performed by the operating team and then the  Left lower extremity is wrapped in Esmarch, knee flexed and the tourniquet inflated to 300 mmHg.       A midline incision is made with a ten blade through the subcutaneous tissue to the level of the extensor mechanism. A fresh blade is used to make a medial parapatellar arthrotomy. Soft tissue over the proximal medial tibia is subperiosteally elevated to the joint line with a knife and into the semimembranosus bursa with a Cobb  elevator. Soft tissue over the proximal lateral tibia is elevated with attention being paid to avoiding the patellar tendon on the tibial tubercle. The patella is everted, knee flexed 90 degrees and the ACL and PCL are removed. Findings are bone on bone medial and patellofemoral with large global osteophytes.        The drill is used to create a starting hole in the distal femur and the canal is thoroughly irrigated with sterile saline to remove the fatty contents. The 5 degree Left  valgus alignment guide is placed into the femoral canal and the distal femoral cutting block is pinned to remove 9 mm off the distal femur. Resection is made with an oscillating saw.      The tibia is subluxed forward and the menisci are removed. The extramedullary alignment guide is placed referencing proximally at the medial aspect of the tibial tubercle and distally along the second metatarsal axis and tibial crest. The block is pinned to remove 17mm off the more deficient medial  side. Resection is made with an oscillating saw. Size 6is the most appropriate size for the tibia and the proximal tibia is prepared with the modular drill and keel punch for that size.      The femoral sizing guide is placed and size 7 is most appropriate. Rotation is marked off the epicondylar axis and confirmed by creating a rectangular flexion gap at 90 degrees. The size 7 cutting block is pinned in this rotation and the anterior, posterior and chamfer cuts are made with the oscillating saw. The intercondylar block is then placed and that cut  is made.      Trial size 6 tibial component, trial size 7 posterior stabilized femur and a 8  mm posterior stabilized rotating platform insert trial is placed. Full extension is achieved with excellent varus/valgus and anterior/posterior balance throughout full range of motion. The patella is everted and thickness measured to be 27  mm. Free hand resection is taken to 15 mm, a 41 template is placed, lug holes  are drilled, trial patella is placed, and it tracks normally. Osteophytes are removed off the posterior femur with the trial in place. All trials are removed and the cut bone surfaces prepared with pulsatile lavage. Cement is mixed and once ready for implantation, the size 6 tibial implant, size  7 posterior stabilized femoral component, and the size 41 patella are cemented in place and the patella is held with the clamp. The trial insert is placed and the knee held in full extension. The Exparel (20 ml mixed with 60 ml saline) is injected into the extensor mechanism, posterior capsule, medial and lateral gutters and subcutaneous tissues.  All extruded cement is removed and once the cement is hard the permanent 8 mm posterior stabilized rotating platform insert is placed into the tibial tray.      The wound is copiously irrigated with saline solution and the extensor mechanism closed with # 0 Stratofix suture. The tourniquet is released for a total tourniquet time of 32  minutes. Flexion against gravity is 140 degrees and the patella tracks normally. Subcutaneous tissue is closed with 2.0 vicryl and subcuticular with running 4.0 Monocryl. The incision is cleaned and dried and steri-strips and a bulky sterile dressing are applied. The limb is placed into a knee immobilizer and the patient is awakened and transported to recovery in stable condition.      Please note that a surgical assistant was a medical necessity for this procedure in order to perform it in a safe and expeditious manner. Surgical assistant was necessary to retract the ligaments and vital neurovascular structures to prevent injury to them and also necessary for proper positioning of the limb to allow for anatomic placement of the prosthesis.   [name] [name], MD    07/11/2022, 9:27 AM

## 2022-07-11 NOTE — Discharge Instructions (Signed)
 Frank Aluisio, MD Total Joint Specialist [facility] [place] [address] [place], [place] [address] [phone]  TOTAL KNEE REPLACEMENT POSTOPERATIVE DIRECTIONS    Knee Rehabilitation, Guidelines Following Surgery  Results after knee surgery are often greatly improved when you follow the exercise, range of motion and muscle strengthening exercises prescribed by your doctor. Safety measures are also important to protect the knee from further injury. If any of these exercises cause you to have increased pain or swelling in your knee joint, decrease the amount until you are comfortable again and slowly increase them. If you have problems or questions, call your caregiver or physical therapist for advice.   HOME CARE INSTRUCTIONS  Remove items at home which could result in a fall. This includes throw rugs or furniture in walking pathways.  ICE to the affected knee as much as tolerated. Icing helps control swelling. If the swelling is well controlled you will be more comfortable and rehab easier. Continue to use ice on the knee for pain and swelling from surgery. You may notice swelling that will progress down to the foot and ankle. This is normal after surgery. Elevate the leg when you are not up walking on it.    Continue to use the breathing machine which will help keep your temperature down. It is common for your temperature to cycle up and down following surgery, especially at night when you are not up moving around and exerting yourself. The breathing machine keeps your lungs expanded and your temperature down. Do not place pillow under the operative knee, focus on keeping the knee straight while resting  DIET You may resume your previous home diet once you are discharged from the hospital.  DRESSING / WOUND CARE / SHOWERING Keep your bulky bandage on for 2 days. On the third post-operative day you may remove the Ace bandage and gauze. There is a waterproof  adhesive bandage on your skin which will stay in place until your first follow-up appointment. Once you remove this you will not need to place another bandage You may begin showering 3 days following surgery, but do not submerge the incision under water.  ACTIVITY For the first 5 days, the key is rest and control of pain and swelling Do your home exercises twice a day starting on post-operative day 3. On the days you go to physical therapy, just do the home exercises once that day. You should rest, ice and elevate the leg for 50 minutes out of every hour. Get up and walk/stretch for 10 minutes per hour. After 5 days you can increase your activity slowly as tolerated. Walk with your walker as instructed. Use the walker until you are comfortable transitioning to a cane. Walk with the cane in the opposite hand of the operative leg. You may discontinue the cane once you are comfortable and walking steadily. Avoid periods of inactivity such as sitting longer than an hour when not asleep. This helps prevent blood clots.  You may discontinue the knee immobilizer once you are able to perform a straight leg raise while lying down. You may resume a sexual relationship in one month or when given the OK by your doctor.  You may return to work once you are cleared by your doctor.  Do not drive a car for 6 weeks or until released by your surgeon.  Do not drive while taking narcotics.  TED HOSE STOCKINGS Wear the elastic stockings on both legs for three weeks following surgery during the   day. You may remove them at night for sleeping.  WEIGHT BEARING Weight bearing as tolerated with assist device (walker, cane, etc) as directed, use it as long as suggested by your surgeon or therapist, typically at least 4-6 weeks.  POSTOPERATIVE CONSTIPATION PROTOCOL Constipation - defined medically as fewer than three stools per week and severe constipation as less than one stool per week.  One of the most common issues  patients have following surgery is constipation.  Even if you have a regular bowel pattern at home, your normal regimen is likely to be disrupted due to multiple reasons following surgery.  Combination of anesthesia, postoperative narcotics, change in appetite and fluid intake all can affect your bowels.  In order to avoid complications following surgery, here are some recommendations in order to help you during your recovery period.  Colace (docusate) - Pick up an over-the-counter form of Colace or another stool softener and take twice a day as long as you are requiring postoperative pain medications.  Take with a full glass of water daily.  If you experience loose stools or diarrhea, hold the colace until you stool forms back up. If your symptoms do not get better within 1 week or if they get worse, check with your doctor. Dulcolax (bisacodyl) - Pick up over-the-counter and take as directed by the product packaging as needed to assist with the movement of your bowels.  Take with a full glass of water.  Use this product as needed if not relieved by Colace only.  MiraLax (polyethylene glycol) - Pick up over-the-counter to have on hand. MiraLax is a solution that will increase the amount of water in your bowels to assist with bowel movements.  Take as directed and can mix with a glass of water, juice, soda, coffee, or tea. Take if you go more than two days without a movement. Do not use MiraLax more than once per day. Call your doctor if you are still constipated or irregular after using this medication for 7 days in a row.  If you continue to have problems with postoperative constipation, please contact the office for further assistance and recommendations.  If you experience "the worst abdominal pain ever" or develop nausea or vomiting, please contact the office immediatly for further recommendations for treatment.  ITCHING If you experience itching with your medications, try taking only a single pain  pill, or even half a pain pill at a time.  You can also use Benadryl over the counter for itching or also to help with sleep.   MEDICATIONS See your medication summary on the "After Visit Summary" that the nursing staff will review with you prior to discharge.  You may have some home medications which will be placed on hold until you complete the course of blood thinner medication.  It is important for you to complete the blood thinner medication as prescribed by your surgeon.  Continue your approved medications as instructed at time of discharge.  PRECAUTIONS If you experience chest pain or shortness of breath - call 911 immediately for transfer to the hospital emergency department.  If you develop a fever greater that 101 F, purulent drainage from wound, increased redness or drainage from wound, foul odor from the wound/dressing, or calf pain - CONTACT YOUR SURGEON.                                                     FOLLOW-UP APPOINTMENTS Make sure you keep all of your appointments after your operation with your surgeon and caregivers. You should call the office at the above phone number and make an appointment for approximately two weeks after the date of your surgery or on the date instructed by your surgeon outlined in the "After Visit Summary".  RANGE OF MOTION AND STRENGTHENING EXERCISES  Rehabilitation of the knee is important following a knee injury or an operation. After just a few days of immobilization, the muscles of the thigh which control the knee become weakened and shrink (atrophy). Knee exercises are designed to build up the tone and strength of the thigh muscles and to improve knee motion. Often times heat used for twenty to thirty minutes before working out will loosen up your tissues and help with improving the range of motion but do not use heat for the first two weeks following surgery. These exercises can be done on a training (exercise) mat, on the floor, on a table or on a bed.  Use what ever works the best and is most comfortable for you Knee exercises include:  Leg Lifts - While your knee is still immobilized in a splint or cast, you can do straight leg raises. Lift the leg to 60 degrees, hold for 3 sec, and slowly lower the leg. Repeat 10-20 times 2-3 times daily. Perform this exercise against resistance later as your knee gets better.  Quad and Hamstring Sets - Tighten up the muscle on the front of the thigh (Quad) and hold for 5-10 sec. Repeat this 10-20 times hourly. Hamstring sets are done by pushing the foot backward against an object and holding for 5-10 sec. Repeat as with quad sets.  Leg Slides: Lying on your back, slowly slide your foot toward your buttocks, bending your knee up off the floor (only go as far as is comfortable). Then slowly slide your foot back down until your leg is flat on the floor again. Angel Wings: Lying on your back spread your legs to the side as far apart as you can without causing discomfort.  A rehabilitation program following serious knee injuries can speed recovery and prevent re-injury in the future due to weakened muscles. Contact your doctor or a physical therapist for more information on knee rehabilitation.   POST-OPERATIVE OPIOID TAPER INSTRUCTIONS: It is important to wean off of your opioid medication as soon as possible. If you do not need pain medication after your surgery it is ok to stop day one. Opioids include: Codeine, Hydrocodone(Norco, Vicodin), Oxycodone(Percocet, oxycontin) and hydromorphone amongst others.  Long term and even short term use of opiods can cause: Increased pain response Dependence Constipation Depression Respiratory depression And more.  Withdrawal symptoms can include Flu like symptoms Nausea, vomiting And more Techniques to manage these symptoms Hydrate well Eat regular healthy meals Stay active Use relaxation techniques(deep breathing, meditating, yoga) Do Not substitute Alcohol to help  with tapering If you have been on opioids for less than two weeks and do not have pain than it is ok to stop all together.  Plan to wean off of opioids This plan should start within one week post op of your joint replacement. Maintain the same interval or time between taking each dose and first decrease the dose.  Cut the total daily intake of opioids by one tablet each day Next start to increase the time between doses. The last dose that should be eliminated is the evening dose.   IF YOU ARE TRANSFERRED TO   A SKILLED REHAB FACILITY If the patient is transferred to a skilled rehab facility following release from the hospital, a list of the current medications will be sent to the facility for the patient to continue.  When discharged from the skilled rehab facility, please have the facility set up the patient's Home Health Physical Therapy prior to being released. Also, the skilled facility will be responsible for providing the patient with their medications at time of release from the facility to include their pain medication, the muscle relaxants, and their blood thinner medication. If the patient is still at the rehab facility at time of the two week follow up appointment, the skilled rehab facility will also need to assist the patient in arranging follow up appointment in our office and any transportation needs.  MAKE SURE YOU:  Understand these instructions.  Get help right away if you are not doing well or get worse.   DENTAL ANTIBIOTICS:  In most cases prophylactic antibiotics for Dental procdeures after total joint surgery are not necessary.  Exceptions are as follows:  1. History of prior total joint infection  2. Severely immunocompromised (Organ Transplant, cancer chemotherapy, Rheumatoid biologic meds such as Humera)  3. Poorly controlled diabetes (A1C &gt; 8.0, blood glucose over 200)  If you have one of these conditions, contact your surgeon for an antibiotic prescription,  prior to your dental procedure.    Pick up stool softner and laxative for home use following surgery while on pain medications. Do not submerge incision under water. Please use good hand washing techniques while changing dressing each day. May shower starting three days after surgery. Please use a clean towel to pat the incision dry following showers. Continue to use ice for pain and swelling after surgery. Do not use any lotions or creams on the incision until instructed by your surgeon.  

## 2022-07-11 NOTE — Anesthesia Procedure Notes (Signed)
Procedure Name: MAC Date/Time: 07/11/2022 8:20 AM  Performed by: Eben Burow, CRNAPre-anesthesia Checklist: Patient identified, Emergency Drugs available, Suction available, Patient being monitored and Timeout performed Oxygen Delivery Method: Simple face mask Placement Confirmation: positive ETCO2

## 2022-07-11 NOTE — Transfer of Care (Signed)
Immediate Anesthesia Transfer of Care Note  Patient: Ivan Mccoy  Procedure(s) Performed: TOTAL KNEE ARTHROPLASTY (Left: Knee)  Patient Location: PACU  Anesthesia Type:Spinal  Level of Consciousness: drowsy and responds to stimulation  Airway & Oxygen Therapy: Patient Spontanous Breathing and Patient connected to face mask oxygen  Post-op Assessment: Report given to RN and Post -op Vital signs reviewed and stable  Post vital signs: Reviewed and stable  Last Vitals:  Vitals Value Taken Time  BP 99/54 07/11/22 0946  Temp    Pulse 54 07/11/22 0948  Resp 12 07/11/22 0948  SpO2 99 % 07/11/22 0948  Vitals shown include unvalidated device data.  Last Pain:  Vitals:   07/11/22 0700  TempSrc:   PainSc: 0-No pain         Complications: No notable events documented.

## 2022-07-12 ENCOUNTER — Encounter (HOSPITAL_COMMUNITY): Payer: Self-pay | Admitting: Orthopedic Surgery

## 2022-07-12 DIAGNOSIS — M1712 Unilateral primary osteoarthritis, left knee: Secondary | ICD-10-CM | POA: Diagnosis not present

## 2022-07-12 LAB — BASIC METABOLIC PANEL
Anion gap: 6 (ref 5–15)
BUN: 23 mg/dL (ref 8–23)
CO2: 23 mmol/L (ref 22–32)
Calcium: 9.2 mg/dL (ref 8.9–10.3)
Chloride: 111 mmol/L (ref 98–111)
Creatinine, Ser: 1.05 mg/dL (ref 0.61–1.24)
GFR, Estimated: >60 mL/min (ref >60)
Glucose, Bld: 115 mg/dL — ABNORMAL HIGH (ref 70–99)
Potassium: 3.8 mmol/L (ref 3.5–5.1)
Sodium: 140 mmol/L (ref 135–145)

## 2022-07-12 LAB — CBC
HCT: 33.7 % — ABNORMAL LOW (ref 39.0–52.0)
Hemoglobin: 11.5 g/dL — ABNORMAL LOW (ref 13.0–17.0)
MCH: 32.6 pg (ref 26.0–34.0)
MCHC: 34.1 g/dL (ref 30.0–36.0)
MCV: 95.5 fL (ref 80.0–100.0)
Platelets: 187 10*3/uL (ref 150–400)
RBC: 3.53 MIL/uL — ABNORMAL LOW (ref 4.22–5.81)
RDW: 12.7 % (ref 11.5–15.5)
WBC: 12.3 10*3/uL — ABNORMAL HIGH (ref 4.0–10.5)
nRBC: 0 % (ref 0.0–0.2)

## 2022-07-12 MED ORDER — OXYCODONE HCL 5 MG PO TABS: 5.0000 mg | ORAL_TABLET | Freq: Four times a day (QID) | ORAL | 0 refills | Status: DC | PRN

## 2022-07-12 MED ORDER — METHOCARBAMOL 500 MG PO TABS: 500.0000 mg | ORAL_TABLET | Freq: Four times a day (QID) | ORAL | 0 refills | Status: DC | PRN

## 2022-07-12 MED ORDER — TRAMADOL HCL 50 MG PO TABS: 50.0000 mg | ORAL_TABLET | Freq: Four times a day (QID) | ORAL | 0 refills | Status: DC | PRN

## 2022-07-12 NOTE — Progress Notes (Signed)
Physical Therapy Treatment Patient Details Name: Ivan Mccoy MRN: [id number] DOB: [birth year] Today's Date: 07/12/2022   History of Present Illness 87 yo male presents to therapy s/p L TKA on 07/11/2022 due to failure of conservative measures. Pt has PMH including but not limited to: CAD s/p stent, s and d CHF, NTSTEMI, A-fib, HDL, HTN, macular degeneration, and OSA.    PT Comments    POD # 1 am session Daughter present for [facility] Education.  Assisted with amb in hallway.  Then returned to room to perform some TE's following HEP handout.  Instructed on proper tech, freq as well as use of ICE.   Will see pt again for second session.   Recommendations for follow up therapy are one component of a multi-disciplinary discharge planning process, led by the attending physician.  Recommendations may be updated based on patient status, additional functional criteria and insurance authorization.  Follow Up Recommendations       Assistance Recommended at Discharge Intermittent Supervision/Assistance  Patient can return home with the following A little help with walking and/or transfers;A little help with bathing/dressing/bathroom;Assistance with cooking/housework;Assist for transportation;Help with stairs or ramp for entrance   Equipment Recommendations  Rolling walker (2 wheels);Other (comment) (pt requesting Editor, commissioning)    Recommendations for Other Services       Precautions / Restrictions Precautions Precautions: Fall;Knee Precaution Comments: instructed no pillow under knee Restrictions Weight Bearing Restrictions: No     Mobility  Bed Mobility Overal bed mobility: Needs Assistance Bed Mobility: Sit to Supine     Supine to sit: Supervision     General bed mobility comments: instructerd how to use belt to self assist    Transfers Overall transfer level: Needs assistance   Transfers: Sit to/from Stand Sit to Stand: Min guard, Supervision           General transfer  comment: cues for proper UE and AD placement    Ambulation/Gait Ambulation/Gait assistance: Supervision, Min guard Gait Distance (Feet): 75 Feet Assistive device: Rolling walker (2 wheels) Gait Pattern/deviations: Step-to pattern, Decreased stance time - left Gait velocity: decreased     General Gait Details: VC's for safety with turns.  Daughter present and "hands on" assisted.  Family Education.   Stairs             Wheelchair Mobility    Modified Rankin (Stroke Patients Only)       Balance                                            Cognition Arousal/Alertness: Awake/alert Behavior During Therapy: WFL for tasks assessed/performed Overall Cognitive Status: Within Functional Limits for tasks assessed                                          Exercises Total Joint Exercises Ankle Circles/Pumps: AROM, 20 reps, Both, Supine Quad Sets: AROM, Left, 5 reps, Supine Towel Squeeze: AROM, Both, 5 reps, Supine Short Arc Quad: AAROM, Left, 5 reps, Supine Heel Slides: AAROM, Left, 5 reps, Supine Hip ABduction/ADduction: AROM, Right, 5 reps, Supine Straight Leg Raises: AAROM, Left, 5 reps, Supine    General Comments        Pertinent Vitals/Pain Pain Assessment Pain Assessment: 0-10 Pain Score: 4  Pain Location: L knee  Pain Descriptors / Indicators: Operative site guarding, Discomfort Pain Intervention(s): Premedicated before session, Repositioned, Ice applied, Monitored during session    Home Living                          Prior Function            PT Goals (current goals can now be found in the care plan section) Progress towards PT goals: Progressing toward goals    Frequency    7X/week      PT Plan Current plan remains appropriate    Co-evaluation              AM-PAC PT "6 Clicks" Mobility   Outcome Measure  Help needed turning from your back to your side while in a flat bed without using  bedrails?: A Little Help needed moving from lying on your back to sitting on the side of a flat bed without using bedrails?: A Little Help needed moving to and from a bed to a chair (including a wheelchair)?: A Little Help needed standing up from a chair using your arms (e.g., wheelchair or bedside chair)?: A Little Help needed to walk in hospital room?: A Little Help needed climbing 3-5 steps with a railing? : A Lot 6 Click Score: 17    End of Session Equipment Utilized During Treatment: Gait belt Activity Tolerance: No increased pain;Patient tolerated treatment well Patient left: in bed;with call bell/phone within reach;with bed alarm set;with family/visitor present Nurse Communication: Mobility status PT Visit Diagnosis: Unsteadiness on feet (R26.81);Other abnormalities of gait and mobility (R26.89);Muscle weakness (generalized) (M62.81);Pain Pain - Right/Left: Left Pain - part of body: Knee     Time: 1138-1205 PT Time Calculation (min) (ACUTE ONLY): 27 min  Charges:  $Gait Training: 8-22 mins $Therapeutic Exercise: 8-22 mins                     {Ballard Budney  PTA Acute  [employer] M-F          [phone]

## 2022-07-12 NOTE — Progress Notes (Cosign Needed)
    HPI: Patient is a 87 year old male who is POD-1 from a s/p TKA LEFT Patient seen by Selinda Michaels PA-S with Dr. Wynelle Link   Patient is currently lying comfortably in bed and states that he slept well overnight. He reports that pain is currently well controled with pain medication. Patient ambulated with PT yesterday and was able to walk 68 feet with RW and min assistance. Teds, SCD's and ice packs are in place. Foley catheter has been removed. Patient denies chest pain, abdominal pain and SHOB.      Vitals reviewed Labs reviewed Medications Reviewed PMH Reviewed Images reviewed   Physical Exam:   General: well appearing male who is alert, cooperative, pleasant and in NAD Skin: warm, dry and intact Resp: Normal effort of respiration, no signs of respiratory distress   MSK: left lower extremity is warm but not erythematous, there is minimal swelling of the left lower extremity localized around the knee joint. There is no ecchymosis. Compartments are soft. Dressing is C/D/I. Patient is able to wiggle toes. Plantarflexion and dorsiflexion are intact.  Neurovascular: sensation and distal pulses are intact in bilateral lower extremities        Assessment: Total knee arthroplasty LEFT    Plan: Continue to monitor vitals  Advance Diet as tolerated D/C IV fluids Continue PT OPPT scheduled with [facility] Follow-up in the clinic is scheduled for 2 weeks post-op with Dr. Wynelle Link   Patient to be discharged home today if goals are met with PT and pain is well controlled.  PDMP was reviewed before opioids were prescribed to patient.    Signed Selinda Michaels PA-S

## 2022-07-12 NOTE — Progress Notes (Signed)
Physical Therapy Treatment Patient Details Name: Ivan Mccoy MRN: [id number] DOB: [birth year] Today's Date: 07/12/2022   History of Present Illness 87 yo male presents to therapy s/p L TKA on 07/11/2022 due to failure of conservative measures. Pt has PMH including but not limited to: CAD s/p stent, s and d CHF, NTSTEMI, A-fib, HDL, HTN, macular degeneration, and OSA.    PT Comments    POD # 1 pm session Spouse present.  Assisted OOB to amb in hallway.  Then returned to room to perform some TE's following HEP handout.  Instructed on proper tech, freq as well as use of ICE.   Addressed all mobility questions, discussed appropriate activity, educated on use of ICE.  Pt ready for D/C to home.   Recommendations for follow up therapy are one component of a multi-disciplinary discharge planning process, led by the attending physician.  Recommendations may be updated based on patient status, additional functional criteria and insurance authorization.  Follow Up Recommendations       Assistance Recommended at Discharge Intermittent Supervision/Assistance  Patient can return home with the following A little help with walking and/or transfers;A little help with bathing/dressing/bathroom;Assistance with cooking/housework;Assist for transportation;Help with stairs or ramp for entrance   Equipment Recommendations  Rolling walker (2 wheels);Other (comment)    Recommendations for Other Services       Precautions / Restrictions Precautions Precautions: Fall;Knee Precaution Comments: instructed no pillow under knee Restrictions Weight Bearing Restrictions: No     Mobility  Bed Mobility Overal bed mobility: Needs Assistance Bed Mobility: Supine to Sit     Supine to sit: Supervision     General bed mobility comments: instructerd how to use belt to self assist    Transfers Overall transfer level: Needs assistance Equipment used: Rolling walker (2 wheels) Transfers: Sit to/from Stand Sit  to Stand: Supervision           General transfer comment: cues for proper UE and AD placement  Impulsive    Ambulation/Gait Ambulation/Gait assistance: Supervision Gait Distance (Feet): 185 Feet Assistive device: Rolling walker (2 wheels) Gait Pattern/deviations: Step-to pattern, Decreased stance time - left Gait velocity: decreased     General Gait Details: VC's for safety with turns.  Spouse present and "hands on" assisted.  Family Education.   Stairs             Wheelchair Mobility    Modified Rankin (Stroke Patients Only)       Balance                                            Cognition Arousal/Alertness: Awake/alert Behavior During Therapy: WFL for tasks assessed/performed Overall Cognitive Status: Within Functional Limits for tasks assessed                                          Exercises Long Arc Quad: AROM, Left, 5 reps, Seated Knee Flexion: AAROM, Left, 5 reps, Seated    General Comments        Pertinent Vitals/Pain Pain Assessment Pain Assessment: 0-10 Pain Score: 4  Pain Location: L knee Pain Descriptors / Indicators: Operative site guarding, Discomfort Pain Intervention(s): Monitored during session, Premedicated before session, Repositioned, Ice applied    Home Living  Prior Function            PT Goals (current goals can now be found in the care plan section) Progress towards PT goals: Progressing toward goals    Frequency    7X/week      PT Plan Current plan remains appropriate    Co-evaluation              AM-PAC PT "6 Clicks" Mobility   Outcome Measure  Help needed turning from your back to your side while in a flat bed without using bedrails?: A Little Help needed moving from lying on your back to sitting on the side of a flat bed without using bedrails?: A Little Help needed moving to and from a bed to a chair (including a  wheelchair)?: A Little Help needed standing up from a chair using your arms (e.g., wheelchair or bedside chair)?: A Little Help needed to walk in hospital room?: A Little Help needed climbing 3-5 steps with a railing? : A Little 6 Click Score: 18    End of Session Equipment Utilized During Treatment: Gait belt Activity Tolerance: Patient tolerated treatment well Patient left: in chair;with call bell/phone within reach Nurse Communication: Mobility status PT Visit Diagnosis: Unsteadiness on feet (R26.81);Other abnormalities of gait and mobility (R26.89);Muscle weakness (generalized) (M62.81);Pain Pain - Right/Left: Left Pain - part of body: Knee     Time: 1413-1430 PT Time Calculation (min) (ACUTE ONLY): 17 min  Charges:  $Gait Training: 8-22 mins $Therapeutic Exercise: 8-22 mins                     Rica Koyanagi  PTA Acute  Rehabilitation Services Office M-F          [phone]

## 2022-07-12 NOTE — TOC Transition Note (Signed)
Transition of Care [facility]) - CM/SW Discharge Note  Patient Details  Name: Ivan Mccoy MRN: [id number] Date of Birth: 14-Jul-1935  Transition of Care [facility]) CM/SW Contact:  Sherie Don, LCSW Phone Number: 07/12/2022, 12:43 PM  Clinical Narrative: Patient is expected to discharge home after working with PT. CSW met with patient to confirm discharge plan and needs. Patient will return to [facility] and do OPPT onsite. Patient will need a rolling walker and ice machine, which [employer] delivered to patient's room. Patient aware the ice machine is not covered by insurance. TOC signing off.  Final next level of care: OP Rehab Barriers to Discharge: No Barriers Identified  Patient Goals and CMS Choice CMS Medicare.gov Compare Post Acute Care list provided to:: Patient Choice offered to / list presented to : Patient  Discharge Plan and Services Additional resources added to the After Visit Summary for          DME Arranged: Other see comment, Walker rolling Media planner) DME Agency: [employer] Representative spoke with at DME Agency: Prearranged in orthopedist's office  Social Determinants of Health ([place]) Interventions SDOH Screenings   Food Insecurity: No Food Insecurity (07/11/2022)  Housing: Low Risk  (07/11/2022)  Transportation Needs: No Transportation Needs (07/11/2022)  Utilities: Not At Risk (07/11/2022)  Tobacco Use: Low Risk  (07/11/2022)   Readmission Risk Interventions     No data to display

## 2022-07-12 NOTE — Progress Notes (Signed)
Subjective: 1 Day Post-Op Procedure(s) (LRB): TOTAL KNEE ARTHROPLASTY (Left) Patient reports pain as mild.   Patient seen in rounds by Dr. Wynelle Link. Patient is well, and has had no acute complaints or problems No issues overnight. Denies chest pain, SOB, or calf pain. Foley catheter removed this AM.  We will continue therapy today, ambulated 38' yesterday.   Objective: Vital signs in last 24 hours: Temp:  [96.1 F (35.6 C)-98.5 F (36.9 C)] 98.5 F (36.9 C) (04/02 0543) Pulse Rate:  [53-70] 66 (04/02 0543) Resp:  [11-24] 15 (04/02 0543) BP: (99-157)/(40-80) 149/65 (04/02 0543) SpO2:  [93 %-100 %] 98 % (04/02 0543) Weight:  [78.9 kg] 78.9 kg (04/01 1305)  Intake/Output from previous day:  Intake/Output Summary (Last 24 hours) at 07/12/2022 0800 Last data filed at 07/12/2022 [id number] Gross per 24 hour  Intake 3189.6 ml  Output 1925 ml  Net 1264.6 ml     Intake/Output this shift: No intake/output data recorded.  Labs: Recent Labs    07/12/22 0351  HGB 11.5*   Recent Labs    07/12/22 0351  WBC 12.3*  RBC 3.53*  HCT 33.7*  PLT 187   Recent Labs    07/12/22 0351  NA 140  K 3.8  CL 111  CO2 23  BUN 23  CREATININE 1.05  GLUCOSE 115*  CALCIUM 9.2   No results for input(s): "LABPT", "INR" in the last 72 hours.  Exam: General - Patient is Alert and Oriented Extremity - Neurologically intact Neurovascular intact Sensation intact distally Dorsiflexion/Plantar flexion intact Dressing - dressing C/D/I Motor Function - intact, moving foot and toes well on exam.   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   Weight as of this encounter: 78.9 kg. Advance diet Up with therapy D/C IV fluids   Patient's anticipated LOS is less than 2 midnights, meeting these requirements: - Lives within 1 hour of care - Has a competent adult at home to recover with post-op recover - NO history of  - Chronic pain requiring opioids  - Diabetes  - Heart attack  - Stroke  - DVT/VTE  - Cardiac arrhythmia  - Respiratory Failure/COPD  - Renal failure  - Anemia  - Advanced Liver disease  DVT Prophylaxis - Xarelto Weight bearing as tolerated. Continue therapy.  Plan is to go Home after hospital stay. Plan for discharge later today if progresses with therapy and meeting goals. Scheduled for OPPT at [facility] Follow-up in the office in 2 weeks.  The PDMP database was reviewed today prior to any opioid medications being prescribed to this patient.  Theresa Duty, PA-C Orthopedic Surgery [phone]  07/12/2022, 8:00 AM

## 2022-07-18 NOTE — Discharge Summary (Signed)
Patient ID: Ivan Mccoy MRN: [id number] DOB/AGE: 85/07/1935 85 y.o.  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 male who was admitted 07/11/2022 for operative treatment ofOA (osteoarthritis) of knee. Patient has severe unremitting pain that affects sleep, daily activities, and work/hobbies. After pre-op clearance the patient was taken to the operating room on 07/11/2022 and underwent  Procedure(s): TOTAL KNEE ARTHROPLASTY.    Patient was given perioperative antibiotics:  Anti-infectives (From admission, onward)    Start     Dose/Rate Route Frequency Ordered Stop   07/11/22 1530  ceFAZolin (ANCEF) IVPB 2g/100 mL premix        2 g 200 mL/hr over 30 Minutes Intravenous Every 6 hours 07/11/22 1208 07/11/22 2353   07/11/22 0645  ceFAZolin (ANCEF) IVPB 2g/100 mL premix        2 g 200 mL/hr over 30 Minutes Intravenous On call to O.R. 07/11/22 [id number] 07/11/22 [id number]        Patient was given sequential compression devices, early ambulation, and chemoprophylaxis to prevent DVT.  Patient benefited maximally from hospital stay and there were no complications.    Recent vital signs: No data found.   Recent laboratory  studies: No results for input(s): "WBC", "HGB", "HCT", "PLT", "NA", "K", "CL", "CO2", "BUN", "CREATININE", "GLUCOSE", "INR", "CALCIUM" in the last 72 hours.  Invalid input(s): "PT", "2"   Discharge Medications:   Allergies as of 07/12/2022       Reactions   Other Anaphylaxis   Hummus NO GARBANZO BEANS!!!   Peanut Butter Flavor Anaphylaxis   Peanut Oil Anaphylaxis   deadly   Peanut-containing Drug Products Anaphylaxis   Sulfa Antibiotics Other (See Comments)   PATIENT WAS (PERHAPS?) ALLERGIC TO THIS CLASS OF MEDS AS A TEENAGER   Eliquis [apixaban] Rash   Spironolactone Rash        Medication List     TAKE these medications    acetaminophen 500 MG tablet Commonly known as: TYLENOL Take 500-1,000 mg by mouth every 6 (six) hours as needed (pain.).   amLODipine 10 MG tablet Commonly known as: NORVASC TAKE ONE (1) TABLET BY MOUTH EACH DAY What changed: See the new instructions.   atorvastatin 40 MG tablet Commonly known as: LIPITOR TAKE 1 TABLET BY MOUTH DAILY What changed: when to take this   carvedilol 25 MG tablet Commonly known as: COREG TAKE ONE TABLET BY MOUTH TWICE DAILY   EPINEPHrine 0.3 mg/0.3 mL Soaj injection Commonly known as: EPI-PEN INJECT INTO MUSCLE ONCE FOR ALLERGIC REACTION   eplerenone 50 MG tablet Commonly known as: INSPRA TAKE 1 TABLET BY MOUTH DAILY   fenofibrate  160 MG tablet TAKE ONE (1) TABLET BY MOUTH EVERY DAY   furosemide 20 MG tablet Commonly known as: LASIX TAKE ONE (1) TABLET BY MOUTH EVERY DAY   GLUCOSAMINE HCL PO Take 1 capsule by mouth 3 (three) times daily.   isosorbide mononitrate 30 MG 24 hr tablet Commonly known as: IMDUR TAKE 1 TABLET BY MOUTH DAILY   METAMUCIL FIBER PO Take 1 Dose by mouth every morning. powder   methocarbamol 500 MG tablet Commonly known as: ROBAXIN Take 1 tablet (500 mg total) by mouth every 6 (six) hours as needed for muscle spasms.   metroNIDAZOLE 0.75 % cream Commonly known as:  METROCREAM Apply 1 application topically every morning. APPLY TO FACE   multivitamin with minerals Tabs tablet Take 1 tablet by mouth in the morning. Centrum Silver   oxyCODONE 5 MG immediate release tablet Commonly known as: Oxy IR/ROXICODONE Take 1-2 tablets (5-10 mg total) by mouth every 6 (six) hours as needed for severe pain.   potassium chloride SA 20 MEQ tablet Commonly known as: KLOR-CON M TAKE 1 TABLET BY MOUTH DAILY   PreserVision AREDS Tabs Take 1 tablet by mouth in the morning and at bedtime.   Probiotic Daily Caps Take 1 capsule by mouth at bedtime.   traMADol 50 MG tablet Commonly known as: ULTRAM Take 1-2 tablets (50-100 mg total) by mouth every 6 (six) hours as needed for moderate pain.   valsartan 320 MG tablet Commonly known as: DIOVAN TAKE ONE (1) TABLET BY MOUTH EACH DAY   Vitamin D3 25 MCG (1000 UT) capsule Generic drug: Cholecalciferol Take 1,000 Units by mouth 3 (three) times daily.   Xarelto 20 MG Tabs tablet Generic drug: rivaroxaban TAKE 1 TABLET BY MOUTH DAILY WITH SUPPER               Discharge Care Instructions  (From admission, onward)           Start     Ordered   07/12/22 0000  Weight bearing as tolerated        07/12/22 0803   07/12/22 0000  Change dressing       Comments: You may remove the bulky bandage (ACE wrap and gauze) two days after surgery. You will have an adhesive waterproof bandage underneath. Leave this in place until your first follow-up appointment.   07/12/22 0803            Diagnostic Studies: No results found.  Disposition: Discharge disposition: 01-Home or Self Care       Discharge Instructions     Call MD / Call 911   Complete by: As directed    If you experience chest pain or shortness of breath, CALL 911 and be transported to the hospital emergency room.  If you develope a fever above 101 F, pus (white drainage) or increased drainage or redness at the wound, or calf pain, call your  surgeon's office.   Change dressing   Complete by: As directed    You may remove the bulky bandage (ACE wrap and gauze) two days after surgery. You will have an adhesive waterproof bandage underneath. Leave this in place until your first follow-up appointment.   Constipation Prevention   Complete by: As directed    Drink plenty of fluids.  Prune juice may be helpful.  You may use a stool softener, such as Colace (over the counter) 100 mg twice a day.  Use MiraLax (over the counter) for constipation as needed.   Diet -  low sodium heart healthy   Complete by: As directed    Do not put a pillow under the knee. Place it under the heel.   Complete by: As directed    Driving restrictions   Complete by: As directed    No driving for two weeks   Post-operative opioid taper instructions:   Complete by: As directed    POST-OPERATIVE OPIOID TAPER INSTRUCTIONS: It is important to wean off of your opioid medication as soon as possible. If you do not need pain medication after your surgery it is ok to stop day one. Opioids include: Codeine, Hydrocodone(Norco, Vicodin), Oxycodone(Percocet, oxycontin) and hydromorphone amongst others.  Long term and even short term use of opiods can cause: Increased pain response Dependence Constipation Depression Respiratory depression And more.  Withdrawal symptoms can include Flu like symptoms Nausea, vomiting And more Techniques to manage these symptoms Hydrate well Eat regular healthy meals Stay active Use relaxation techniques(deep breathing, meditating, yoga) Do Not substitute Alcohol to help with tapering If you have been on opioids for less than two weeks and do not have pain than it is ok to stop all together.  Plan to wean off of opioids This plan should start within one week post op of your joint replacement. Maintain the same interval or time between taking each dose and first decrease the dose.  Cut the total daily intake of opioids by one  tablet each day Next start to increase the time between doses. The last dose that should be eliminated is the evening dose.      TED hose   Complete by: As directed    Use stockings (TED hose) for three weeks on both leg(s).  You may remove them at night for sleeping.   Weight bearing as tolerated   Complete by: As directed         Follow-up Information     Ollen Gross, MD. Go on 07/26/2022.   Specialty: Orthopedic Surgery Why: You are scheduled for first post op appt on Tuesday April 16 at 3:15pm. Contact information: [address] [place] [address] [place] [address] [phone]                  Signed: Arther Abbott 07/18/2022, 8:02 AM

## 2022-08-02 ENCOUNTER — Other Ambulatory Visit: Payer: Self-pay | Admitting: Cardiology

## 2022-09-21 ENCOUNTER — Ambulatory Visit: Payer: Medicare Other | Admitting: General Practice

## 2022-10-09 NOTE — Progress Notes (Unsigned)
Cardiology Office Note   Date:  10/11/2022   ID:  Ivan Mccoy, DOB [birth year], MRN [id number]  PCP:  [facility]  Cardiologist:   Ondine Gemme [place], MD   Chief Complaint  Patient presents with   Coronary Artery Disease   Atrial Fibrillation       History of Present Illness: Ivan Mccoy is a 87 y.o. male who is seen at the request of Dr  Melvyn Neth for evaluation of CAD and paroxysmal Afib.  He lives at [facility]. He has a history of atrial fibrillation status post ablation in September 2013.  He also has a history of sleep apnea and is compliant with CPAP therapy. Additional past medical history is significant for hypertension and hyperlipidemia. He had a Cardiolite exercise tolerance test 05/06/2011 which was negative for ischemia. Echocardiogram also in January 2013 showed normal left ventricular systolic function with an estimated ejection fraction of 50-55%. There was moderate left ventricular hypertrophy with diastolic dysfunction. The left atrium and right atrium were mildly enlarged. Moderate tricuspid regurgitation and mild pulmonary hypertension was noted at that time.  In May 2017 he presented here with complaints of dyspnea, cough, subjective fever and chills. His CXR showed evidence of bilateral pneumonia and potentially superimposed mild interstitial edema. BNP was abnormal at 1070. Troponin was elevated at 1.15. Ecg showed T wave inversions in V5-6. He was treated for CAP. He was noted to have some episodes of Afib. Echo showed normal EF with mild basal septal hypertrophy. Mild MR and TR. Mild pulmonary HTN. He did undergo left heart cath which revealed 3 vessel obstructive CAD with predominant branch vessel disease. This includes the first and second diagonal, PLOM and RV marginal branches of the RCA. The LCx lesion appeared to be the culprit vessel and was treated w/ DES to mid distal LCx. He was DC on Plavix and Xarelto.   On subsequent follow up with his  cardiologist in [place] he was noted to have a more significant murmur and Echo showed anterior MV prolapse with moderate MR. TEE showed severe prolapse of the A2 scallop of the anterior leaflet with severe MR. He also had severe pulmonary HTN. Event monitor in 2017 showed NSR with PACs. No Afib. He was referred to [facility] and seen by Dr Silvestre Mesi. On 12/21/15 he had attempt at MV repair but ultimately required MV replacement with a #27 Carentier Edwards pericardial valve. Echo on November 03, 2017 showed EF 50% with moderate LVH. Moderate LAE. Normally functioning MV prosthesis. Mean gradient 3 mm Hg. Trivial MR. Mild AI and TR.   He was seen in October. BP was high so amlodipine increased to 10 mg daily. BP still high so we switched losartan to valsartan 320 mg daily. Echo was repeated in nov- Normal LV function, MV gradient of 3-4 mm Hg. Mild to moderate MR. Normal RV pressures.   On April 1 he underwent left TKR without complications.   On follow up today he is doing well. Denies any chest pain or SOB. No palpitations. BP is excellent. He has completed PT and notes improvement in balance post TKR. One day last week wife noted he had a very transient blank. No recurrence since.     Past Medical History:  Diagnosis Date   Arrhythmia    paroxysmal afib   Arthritis    Cancer (HCC)    skin cancer   CHF (congestive heart failure) (HCC)    Coronary artery disease    Heart murmur  Hyperlipidemia    Hypertension    Macular degeneration    Myocardial infarction (HCC)    Obstructive sleep apnea    cpap   Pneumonia    hx of   Wet age-related macular degeneration of both eyes with inactive choroidal neovascularization (HCC)    followed by DR [name]    Past Surgical History:  Procedure Laterality Date   APPENDECTOMY     CARDIAC CATHETERIZATION N/A 08/31/2015   Procedure: Left Heart Cath and Coronary Angiography;  Surgeon: Mardy Lucier M [place], MD;  Location: [facility] INVASIVE CV LAB;  Service:  Cardiovascular;  Laterality: N/A;   CARDIAC CATHETERIZATION N/A 08/31/2015   Procedure: Coronary Stent Intervention;  Surgeon: Jacai Kipp M [place], MD;  Location: [facility] INVASIVE CV LAB;  Service: Cardiovascular;  Laterality: N/A;   CORONARY STENT INTERVENTION     excision of squamous cell ca Right    cheek   HEMATOMA EVACUATION     in left leg - 2012   HERNIA REPAIR     meniscus tear surgery      MITRAL VALVE REPLACEMENT  12/21/2015   #27 Carpentier Edwards pericardial valve.    PROSTATE BIOPSY     TONSILLECTOMY AND ADENOIDECTOMY     TOTAL KNEE ARTHROPLASTY Left 07/11/2022   Procedure: TOTAL KNEE ARTHROPLASTY;  Surgeon: Ollen Gross, MD;  Location: [facility] ORS;  Service: Orthopedics;  Laterality: Left;     Current Outpatient Medications  Medication Sig Dispense Refill   acetaminophen (TYLENOL) 500 MG tablet Take 500-1,000 mg by mouth as needed (pain.).     amLODipine (NORVASC) 10 MG tablet TAKE ONE (1) TABLET BY MOUTH EACH DAY (Patient taking differently: Take 10 mg by mouth every evening.) 90 tablet 3   atorvastatin (LIPITOR) 40 MG tablet TAKE 1 TABLET BY MOUTH DAILY (Patient taking differently: Take 40 mg by mouth every evening.) 90 tablet 3   carvedilol (COREG) 25 MG tablet TAKE ONE TABLET BY MOUTH TWICE DAILY 180 tablet 2   Cholecalciferol (VITAMIN D3) 25 MCG (1000 UT) CAPS Take 1,000 Units by mouth 3 (three) times daily.     EPINEPHRINE 0.3 mg/0.3 mL IJ SOAJ injection INJECT INTO MUSCLE ONCE FOR ALLERGIC REACTION 2 each 2   eplerenone (INSPRA) 50 MG tablet TAKE 1 TABLET BY MOUTH DAILY 90 tablet 3   fenofibrate 160 MG tablet TAKE ONE (1) TABLET BY MOUTH EVERY DAY 90 tablet 3   FERGON 240 (27 Fe) MG tablet Take 1 tablet by mouth every 3 (three) days.     furosemide (LASIX) 20 MG tablet TAKE ONE (1) TABLET BY MOUTH EVERY DAY 90 tablet 2   GLUCOSAMINE HCL PO Take 1 capsule by mouth 3 (three) times daily.     isosorbide mononitrate (IMDUR) 30 MG 24 hr tablet TAKE 1 TABLET BY MOUTH DAILY 90 tablet  2   metroNIDAZOLE (METROCREAM) 0.75 % cream Apply 1 application topically every morning. APPLY TO FACE     Multiple Vitamin (MULTIVITAMIN WITH MINERALS) TABS tablet Take 1 tablet by mouth in the morning. Centrum Silver     Multiple Vitamins-Minerals (PRESERVISION AREDS) TABS Take 1 tablet by mouth in the morning and at bedtime.     potassium chloride SA (KLOR-CON M) 20 MEQ tablet TAKE 1 TABLET BY MOUTH DAILY 90 tablet 2   Probiotic Product (PROBIOTIC DAILY) CAPS Take 1 capsule by mouth at bedtime.     Psyllium (METAMUCIL FIBER PO) Take 1 Dose by mouth every morning. powder     rivaroxaban (XARELTO) 20 MG TABS  tablet TAKE 1 TABLET BY MOUTH DAILY WITH SUPPER 90 tablet 1   valsartan (DIOVAN) 320 MG tablet TAKE ONE (1) TABLET BY MOUTH EACH DAY 90 tablet 3   No current facility-administered medications for this visit.    Allergies:   Other, Peanut butter flavor, Peanut oil, Peanut-containing drug products, Sulfa antibiotics, Eliquis [apixaban], and Spironolactone    Social History:  The patient  reports that he has never smoked. He has never used smokeless tobacco. He reports current alcohol use. He reports that he does not use drugs.   Family History:  The patient's family history includes Heart attack in his father.    ROS:  Please see the history of present illness.   Otherwise, review of systems are positive for none.   All other systems are reviewed and negative.    PHYSICAL EXAM: VS:  BP 136/74 (BP Location: Left Arm, Patient Position: Sitting, Cuff Size: Normal)   Pulse 63   Ht 5\' 9"  (1.753 m)   Wt 179 lb (81.2 kg)   SpO2 98%   BMI 26.43 kg/m  , BMI Body mass index is 26.43 kg/m. GEN: Well nourished, well developed, in no acute distress  HEENT: normal  Neck: no JVD, carotid bruits, or masses Cardiac: RRR; soft 1/6 systolic murmur LSB/apex, no rubs, or gallops,no edema  Respiratory:  clear to auscultation bilaterally, normal work of breathing GI: soft, nontender, nondistended,  + BS MS: no deformity or atrophy  Skin: warm and dry, no rash Neuro:  Strength and sensation are intact Psych: euthymic mood, full affect   EKG Interpretation Date/Time:  Tuesday October 11 2022 15:37:44 EDT Ventricular Rate:  63 PR Interval:  184 QRS Duration:  122 QT Interval:  430 QTC Calculation: 440 R Axis:   -62  Text Interpretation: Normal sinus rhythm Left anterior fascicular block Minimal voltage criteria for LVH, may be normal variant ( Cornell product ) When compared with ECG of 01-Sep-2015 07:14, Incomplete right bundle branch block is no longer Present Confirmed by [place], Daysha Ashmore [phone]) on 10/11/2022 3:40:00 PM      Recent Labs: 07/12/2022: BUN 23; Creatinine, Ser 1.05; Hemoglobin 11.5; Platelets 187; Potassium 3.8; Sodium 140    Lipid Panel No results found for: "CHOL", "TRIG", "HDL", "CHOLHDL", "VLDL", "LDLCALC", "LDLDIRECT"   Dated 01/15/19: LDL 43, triglycerides 81, TSH normal.  Dated 05/22/20: cholesterol 93, triglycerides 86, HDL 34, LDL 42. GFR 59.  Dated 05/27/21: cholesterol 112, triglycerides 104, HDL 38, NonHDL 74.   Cardiac Catheterization: 08/31/2015    Dist RCA lesion, 100% stenosed. RPDA lesion, 40% stenosed. Acute Mrg lesion, 99% stenosed. Ost 1st Diag lesion, 80% stenosed. Ost 2nd Diag to 2nd Diag lesion, 90% stenosed. There is mild left ventricular systolic dysfunction. Prox Cx to Mid Cx lesion, 99% stenosed. Post intervention, there is a 0% residual stenosis.   1. 3 vessel obstructive CAD with predominant branch vessel disease. This includes the first and second diagonal, PLOM and RV marginal branches of the RCA. The LCx lesion appears to be the culprit vessel 2. Mild LV dysfunction with elevated LV EDP 3. Successful stenting of the mid-distal LCx with DES.    Plan: DAPT with ASA and Plavix for one month then discontinue ASA. Continue Plavix for at least one year. Plan to resume Pradaxa tomorrow. Patient needs additional IV diuresis. Continue beta  blocker and add oral nitrate.   Echo 08/25/15:Study Conclusions   - Procedure narrative: Transthoracic echocardiography. Image   quality was adequate. The study was technically difficult. -  Left ventricle: The cavity size was normal. There was mild focal   basal hypertrophy of the septum. Systolic function was normal.   Wall motion was normal; there were no regional wall motion   abnormalities. The study is not technically sufficient to allow   evaluation of LV diastolic function. - Aortic valve: Transvalvular velocity was within the normal range.   There was no stenosis. There was mild regurgitation. - Mitral valve: Calcified annulus. There was mild regurgitation. - Left atrium: The atrium was moderately dilated. - Right ventricle: The cavity size was normal. Wall thickness was   normal. Systolic function was normal. - Tricuspid valve: There was mild regurgitation. - Pulmonary arteries: Systolic pressure was moderately increased.   PA peak pressure: 49 mm Hg (S). - Inferior vena cava: The vessel was normal in size. The   respirophasic diameter changes were blunted (< 50%), consistent   with normal central venous pressure.  Echo 01/12/21: IMPRESSIONS     1. The mitral valve has been replaced with a 27 mm Carpenteir Edwards  Pericardial Valve.   2. Prothestic valve parameters: Pressure Halte time 106 ms, TVI 3, EOA  1.4 cm2, mean gradient 3. There is evidence of some patient prosthesis  mismatch without significant mitral gradients and stenosis. There is  evidence of some degree of pathologic  regurgitation not well visualized by color Doppler. In the setting of  multiple valve disease (aortic, pulmonic, mitral), consider TEE for  further assessment, or earlier screening TTE follow up.   3. Pulmonic valve regurgitation is moderate.   4. Left ventricular ejection fraction, by estimation, is 55 to 60%. Left  ventricular ejection fraction by 3D volume is 55 %. The left ventricle has   normal function. The left ventricle has no regional wall motion  abnormalities. Left ventricular diastolic   parameters are indeterminate.   5. Right ventricular systolic function is normal. The right ventricular  size is normal. There is normal pulmonary artery systolic pressure.   6. Left atrial size was moderately dilated.   7. The aortic valve was not well visualized. Aortic valve regurgitation  is mild. No aortic stenosis is present.   Comparison(s): A prior study was performed on 08/25/2015. New mitral valve  prosthesis.   ASSESSMENT AND PLAN:  1.  CAD. S/p  NSTEMI in setting of CAP in May 2017. S/p DES of mid to distal LCx. Branch vessel disease as noted. Continue antianginal therapy with nitrates, Coreg, amlodipine. No antiplatelet therapy since on Xarelto. He is  asymptomatic.   2. Paroxysmal Afib. No recurrence documented since his valve surgery in 2017. He is at higher risk for recurrence. [place] Vasc score of 4. I would advise to continue Xarelto unless he was to have problems with bleeding.   3. History of MVP with severe MR. S/p MV replacement in 2017 with pericardial valve. Echo in Nov 2022 showed some MR mild to moderate.  SBE prophylaxis.   4. Hypercholesterolemia. Has been well controlled. Due for follow up with PCP in 2 weeks.   5. HTN- currently well controlled on multiple meds.   6. Chronic diastolic CHF. Well compensated.   7. OSA on CPAP  8. Osteoarthritis of knee. S/p TKR   Current medicines are reviewed at length with the patient today.  The patient does not have concerns regarding medicines.  The following changes have been made:  no change  Labs/ tests ordered today include:   Orders Placed This Encounter  Procedures   EKG 12-Lead  Disposition:   FU with me in 6 months  Signed, Kenidy Crossland [place], MD  10/11/2022 3:52 PM    [facility] [address], [place], [place], [address] Phone [phone], Fax [phone]

## 2022-10-11 ENCOUNTER — Encounter: Payer: Self-pay | Admitting: Cardiology

## 2022-10-11 ENCOUNTER — Ambulatory Visit: Payer: Medicare Other | Attending: Cardiology | Admitting: Cardiology

## 2022-10-11 VITALS — BP 136/74 | HR 63 | Ht 69.0 in | Wt 179.0 lb

## 2022-10-11 DIAGNOSIS — I25118 Atherosclerotic heart disease of native coronary artery with other forms of angina pectoris: Secondary | ICD-10-CM | POA: Diagnosis not present

## 2022-10-11 DIAGNOSIS — I10 Essential (primary) hypertension: Secondary | ICD-10-CM | POA: Diagnosis present

## 2022-10-11 DIAGNOSIS — I480 Paroxysmal atrial fibrillation: Secondary | ICD-10-CM | POA: Insufficient documentation

## 2022-10-11 DIAGNOSIS — Z952 Presence of prosthetic heart valve: Secondary | ICD-10-CM | POA: Insufficient documentation

## 2022-10-11 DIAGNOSIS — I340 Nonrheumatic mitral (valve) insufficiency: Secondary | ICD-10-CM | POA: Insufficient documentation

## 2022-10-11 NOTE — Patient Instructions (Signed)
Medication Instructions:  Continue same medications *If you need a refill on your cardiac medications before your next appointment, please call your pharmacy*   Lab Work: None ordered   Testing/Procedures: None ordered   Follow-Up: At [facility], you and your health needs are our priority.  As part of our continuing mission to provide you with exceptional heart care, we have created designated Provider Care Teams.  These Care Teams include your primary Cardiologist (physician) and Advanced Practice Providers (APPs -  Physician Assistants and Nurse Practitioners) who all work together to provide you with the care you need, when you need it.  We recommend signing up for the patient portal called "MyChart".  Sign up information is provided on this After Visit Summary.  MyChart is used to connect with patients for Virtual Visits (Telemedicine).  Patients are able to view lab/test results, encounter notes, upcoming appointments, etc.  Non-urgent messages can be sent to your provider as well.   To learn more about what you can do with MyChart, go to [email or web address].    Your next appointment:  6 months    Call in August to schedule Jan appointment     Provider:  Dr.Jordan   

## 2022-11-14 ENCOUNTER — Other Ambulatory Visit: Payer: Self-pay | Admitting: Cardiology

## 2023-01-23 ENCOUNTER — Other Ambulatory Visit: Payer: Self-pay | Admitting: Cardiology

## 2023-01-23 NOTE — Telephone Encounter (Signed)
Prescription refill request for Xarelto received.  Indication:afib Last office visit:7/24 Weight:81.2  kg Age:87 Scr:1.05  4/24 CrCl:59.07  ml/min  Prescription refilled

## 2023-02-07 ENCOUNTER — Other Ambulatory Visit: Payer: Self-pay | Admitting: Cardiology

## 2023-03-13 ENCOUNTER — Other Ambulatory Visit: Payer: Self-pay | Admitting: Cardiology

## 2023-03-16 ENCOUNTER — Other Ambulatory Visit: Payer: Self-pay | Admitting: Cardiology

## 2023-03-20 ENCOUNTER — Telehealth: Payer: Self-pay | Admitting: *Deleted

## 2023-03-20 NOTE — Telephone Encounter (Signed)
   Pre-operative Risk Assessment    Patient Name: Ivan Mccoy  DOB: [birth year] MRN: [id number]  DATE OF LAST VISIT: 10/11/22 DR. [place] DATE OF NEXT VISIT: 03/27/23 DR. [place]    Request for Surgical Clearance    Procedure:   MRI FUSION Bx  Date of Surgery:  Clearance TBD                                 Surgeon:  DR. Berniece Salines Surgeon's Group or Practice Name:  [facility] Phone number:  [phone] Fax number:  [phone]   Type of Clearance Requested:   - Medical  - Pharmacy:  Hold Rivaroxaban (Xarelto) x 3 DAYS PRIOR   Type of Anesthesia:  Not Indicated   Additional requests/questions:    Elpidio Anis   03/20/2023, 4:35 PM

## 2023-03-22 NOTE — Progress Notes (Signed)
Cardiology Office Note   Date:  03/27/2023   ID:  Ivan Mccoy, DOB [birth year], MRN [id number]  PCP:  [facility]  Cardiologist:   Kristapher Dubuque Sw[place], MD   Chief Complaint  Patient presents with   Coronary Artery Disease    History of Present Illness: Ivan Mccoy is a 87 y.o. male who is seen for follow up of CAD and paroxysmal Afib. He also needs clearance for urologic procedure.  He lives at [facility]. He has a history of atrial fibrillation status post ablation in September 2013.  He also has a history of sleep apnea and is compliant with CPAP therapy. Additional past medical history is significant for hypertension and hyperlipidemia. He had a Cardiolite exercise tolerance test 05/06/2011 which was negative for ischemia. Echocardiogram also in January 2013 showed normal left ventricular systolic function with an estimated ejection fraction of 50-55%. There was moderate left ventricular hypertrophy with diastolic dysfunction. The left atrium and right atrium were mildly enlarged. Moderate tricuspid regurgitation and mild pulmonary hypertension was noted at that time.  In May 2017 he presented here with complaints of dyspnea, cough, subjective fever and chills. His CXR showed evidence of bilateral pneumonia and potentially superimposed mild interstitial edema. BNP was abnormal at 1070. Troponin was elevated at 1.15. Ecg showed T wave inversions in V5-6. He was treated for CAP. He was noted to have some episodes of Afib. Echo showed normal EF with mild basal septal hypertrophy. Mild MR and TR. Mild pulmonary HTN. He did undergo left heart cath which revealed 3 vessel obstructive CAD with predominant branch vessel disease. This includes the first and second diagonal, PLOM and RV marginal branches of the RCA. The LCx lesion appeared to be the culprit vessel and was treated w/ DES to mid distal LCx. He was DC on Plavix and Xarelto.   On subsequent follow up with his  cardiologist in [place] he was noted to have a more significant murmur and Echo showed anterior MV prolapse with moderate MR. TEE showed severe prolapse of the A2 scallop of the anterior leaflet with severe MR. He also had severe pulmonary HTN. Event monitor in 2017 showed NSR with PACs. No Afib. He was referred to [facility] and seen by Dr Silvestre Mesi. On 12/21/15 he had attempt at MV repair but ultimately required MV replacement with a #27 Carentier Edwards pericardial valve. Echo on November 03, 2017 showed EF 50% with moderate LVH. Moderate LAE. Normally functioning MV prosthesis. Mean gradient 3 mm Hg. Trivial MR. Mild AI and TR.   He was seen in October. BP was high so amlodipine increased to 10 mg daily. BP still high so we switched losartan to valsartan 320 mg daily. Echo was repeated in nov- Normal LV function, MV gradient of 3-4 mm Hg. Mild to moderate MR. Normal RV pressures.   On April 1 he underwent left TKR without complications.   On follow up today he is doing well. Denies any chest pain or SOB. He has fully recovered from his knee surgery. He is planning to have a prostate biopsy done by Dr Marlou Porch.     Past Medical History:  Diagnosis Date   Arrhythmia    paroxysmal afib   Arthritis    Cancer (HCC)    skin cancer   CHF (congestive heart failure) (HCC)    Coronary artery disease    Heart murmur    Hyperlipidemia    Hypertension    Macular degeneration    Myocardial  infarction (HCC)    Obstructive sleep apnea    cpap   Pneumonia    hx of   Wet age-related macular degeneration of both eyes with inactive choroidal neovascularization (HCC)    followed by DR Ivan Mccoy    Past Surgical History:  Procedure Laterality Date   APPENDECTOMY     CARDIAC CATHETERIZATION N/A 08/31/2015   Procedure: Left Heart Cath and Coronary Angiography;  Surgeon: Ivan Mccoy M [place], MD;  Location: [facility] INVASIVE CV LAB;  Service: Cardiovascular;  Laterality: N/A;   CARDIAC CATHETERIZATION N/A 08/31/2015    Procedure: Coronary Stent Intervention;  Surgeon: Ivan Mccoy M [place], MD;  Location: [facility] INVASIVE CV LAB;  Service: Cardiovascular;  Laterality: N/A;   CORONARY STENT INTERVENTION     excision of squamous cell ca Right    cheek   HEMATOMA EVACUATION     in left leg - 2012   HERNIA REPAIR     meniscus tear surgery      MITRAL VALVE REPLACEMENT  12/21/2015   #27 Carpentier Edwards pericardial valve.    PROSTATE BIOPSY     TONSILLECTOMY AND ADENOIDECTOMY     TOTAL KNEE ARTHROPLASTY Left 07/11/2022   Procedure: TOTAL KNEE ARTHROPLASTY;  Surgeon: Ivan Gross, MD;  Location: [facility] ORS;  Service: Orthopedics;  Laterality: Left;     Current Outpatient Medications  Medication Sig Dispense Refill   acetaminophen (TYLENOL) 500 MG tablet Take 500-1,000 mg by mouth as needed (pain.).     amLODipine (NORVASC) 10 MG tablet TAKE ONE (1) TABLET BY MOUTH EACH DAY 90 tablet 1   atorvastatin (LIPITOR) 40 MG tablet TAKE 1 TABLET BY MOUTH DAILY (Patient taking differently: Take 40 mg by mouth every evening.) 90 tablet 3   carvedilol (COREG) 25 MG tablet TAKE ONE TABLET BY MOUTH TWICE DAILY 180 tablet 2   Cholecalciferol (VITAMIN D3) 25 MCG (1000 UT) CAPS Take 1,000 Units by mouth 3 (three) times daily.     EPINEPHRINE 0.3 mg/0.3 mL IJ SOAJ injection INJECT INTO MUSCLE ONCE FOR ALLERGIC REACTION 2 each 2   eplerenone (INSPRA) 50 MG tablet TAKE 1 TABLET BY MOUTH DAILY 90 tablet 3   FERGON 240 (27 Fe) MG tablet Take 1 tablet by mouth every 3 (three) days.     furosemide (LASIX) 20 MG tablet TAKE ONE (1) TABLET BY MOUTH EVERY DAY 90 tablet 2   GLUCOSAMINE HCL PO Take 1 capsule by mouth 3 (three) times daily.     isosorbide mononitrate (IMDUR) 30 MG 24 hr tablet TAKE 1 TABLET BY MOUTH DAILY 90 tablet 2   metroNIDAZOLE (METROCREAM) 0.75 % cream Apply 1 application topically every morning. APPLY TO FACE     Multiple Vitamin (MULTIVITAMIN WITH MINERALS) TABS tablet Take 1 tablet by mouth in the morning. Centrum Silver      Multiple Vitamins-Minerals (PRESERVISION AREDS) TABS Take 1 tablet by mouth in the morning and at bedtime.     potassium chloride SA (KLOR-CON M) 20 MEQ tablet TAKE 1 TABLET BY MOUTH DAILY 90 tablet 2   Probiotic Product (PROBIOTIC DAILY) CAPS Take 1 capsule by mouth at bedtime.     Psyllium (METAMUCIL FIBER PO) Take 1 Dose by mouth every morning. powder     tamsulosin (FLOMAX) 0.4 MG CAPS capsule Take 0.4 mg by mouth.     XARELTO 20 MG TABS tablet TAKE 1 TABLET BY MOUTH DAILY WITH SUPPER 90 tablet 1   fenofibrate 160 MG tablet TAKE ONE (1) TABLET BY MOUTH EVERY DAY (Patient  not taking: Reported on 03/27/2023) 90 tablet 3   valsartan (DIOVAN) 320 MG tablet Take 1 tablet (320 mg total) by mouth daily. 90 tablet 3   No current facility-administered medications for this visit.    Allergies:   Other, Peanut butter flavoring agent (non-screening), Peanut oil, Peanut-containing drug products, Sulfa antibiotics, Eliquis [apixaban], and Spironolactone    Social History:  The patient  reports that he has never smoked. He has never used smokeless tobacco. He reports current alcohol use. He reports that he does not use drugs.   Family History:  The patient's family history includes Heart attack in his father.    ROS:  Please see the history of present illness.   Otherwise, review of systems are positive for none.   All other systems are reviewed and negative.    PHYSICAL EXAM: VS:  BP 134/60 (BP Location: Left Arm, Patient Position: Sitting, Cuff Size: Normal)   Ht 5\' 9"  (1.753 m)   Wt 189 lb (85.7 kg)   SpO2 97%   BMI 27.91 kg/m  , BMI Body mass index is 27.91 kg/m. GEN: Well nourished, well developed, in no acute distress  HEENT: normal  Neck: no JVD, carotid bruits, or masses Cardiac: RRR; soft 1/6 systolic murmur LSB/apex, no rubs, or gallops,no edema  Respiratory:  clear to auscultation bilaterally, normal work of breathing GI: soft, nontender, nondistended, + BS MS: no deformity or  atrophy  Skin: warm and dry, no rash Neuro:  Strength and sensation are intact Psych: euthymic mood, full affect          Recent Labs: 07/12/2022: BUN 23; Creatinine, Ser 1.05; Hemoglobin 11.5; Platelets 187; Potassium 3.8; Sodium 140    Lipid Panel No results found for: "CHOL", "TRIG", "HDL", "CHOLHDL", "VLDL", "LDLCALC", "LDLDIRECT"   Dated 01/15/19: LDL 43, triglycerides 81, TSH normal.  Dated 05/22/20: cholesterol 93, triglycerides 86, HDL 34, LDL 42. GFR 59.  Dated 05/27/21: cholesterol 112, triglycerides 104, HDL 38, NonHDL 74.   Cardiac Catheterization: 08/31/2015    Dist RCA lesion, 100% stenosed. RPDA lesion, 40% stenosed. Acute Mrg lesion, 99% stenosed. Ost 1st Diag lesion, 80% stenosed. Ost 2nd Diag to 2nd Diag lesion, 90% stenosed. There is mild left ventricular systolic dysfunction. Prox Cx to Mid Cx lesion, 99% stenosed. Post intervention, there is a 0% residual stenosis.   1. 3 vessel obstructive CAD with predominant branch vessel disease. This includes the first and second diagonal, PLOM and RV marginal branches of the RCA. The LCx lesion appears to be the culprit vessel 2. Mild LV dysfunction with elevated LV EDP 3. Successful stenting of the mid-distal LCx with DES.    Plan: DAPT with ASA and Plavix for one month then discontinue ASA. Continue Plavix for at least one year. Plan to resume Pradaxa tomorrow. Patient needs additional IV diuresis. Continue beta blocker and add oral nitrate.   Echo 08/25/15:Study Conclusions   - Procedure narrative: Transthoracic echocardiography. Image   quality was adequate. The study was technically difficult. - Left ventricle: The cavity size was normal. There was mild focal   basal hypertrophy of the septum. Systolic function was normal.   Wall motion was normal; there were no regional wall motion   abnormalities. The study is not technically sufficient to allow   evaluation of LV diastolic function. - Aortic valve:  Transvalvular velocity was within the normal range.   There was no stenosis. There was mild regurgitation. - Mitral valve: Calcified annulus. There was mild regurgitation. - Left atrium:  The atrium was moderately dilated. - Right ventricle: The cavity size was normal. Wall thickness was   normal. Systolic function was normal. - Tricuspid valve: There was mild regurgitation. - Pulmonary arteries: Systolic pressure was moderately increased.   PA peak pressure: 49 mm Hg (S). - Inferior vena cava: The vessel was normal in size. The   respirophasic diameter changes were blunted (< 50%), consistent   with normal central venous pressure.  Echo 01/12/21: IMPRESSIONS     1. The mitral valve has been replaced with a 27 mm Carpenteir Edwards  Pericardial Valve.   2. Prothestic valve parameters: Pressure Halte time 106 ms, TVI 3, EOA  1.4 cm2, mean gradient 3. There is evidence of some patient prosthesis  mismatch without significant mitral gradients and stenosis. There is  evidence of some degree of pathologic  regurgitation not well visualized by color Doppler. In the setting of  multiple valve disease (aortic, pulmonic, mitral), consider TEE for  further assessment, or earlier screening TTE follow up.   3. Pulmonic valve regurgitation is moderate.   4. Left ventricular ejection fraction, by estimation, is 55 to 60%. Left  ventricular ejection fraction by 3D volume is 55 %. The left ventricle has  normal function. The left ventricle has no regional wall motion  abnormalities. Left ventricular diastolic   parameters are indeterminate.   5. Right ventricular systolic function is normal. The right ventricular  size is normal. There is normal pulmonary artery systolic pressure.   6. Left atrial size was moderately dilated.   7. The aortic valve was not well visualized. Aortic valve regurgitation  is mild. No aortic stenosis is present.   Comparison(s): A prior study was performed on 08/25/2015.  New mitral valve  prosthesis.   ASSESSMENT AND PLAN:  1.  CAD. S/p  NSTEMI in setting of CAP in May 2017. S/p DES of mid to distal LCx. Branch vessel disease as noted. Continue antianginal therapy with nitrates, Coreg, amlodipine. No antiplatelet therapy since on Xarelto. He is  asymptomatic.   2. Paroxysmal Afib. No recurrence documented since his valve surgery in 2017. He is at higher risk for recurrence. [place] Vasc score of 4. I would advise to continue Xarelto unless he was to have problems with bleeding.   3. History of MVP with severe MR. S/p MV replacement in 2017 with pericardial valve. Echo in Nov 2022 showed some MR mild to moderate.  SBE prophylaxis.   4. Hypercholesterolemia. Has been well controlled.  5. HTN- currently well controlled on multiple meds.   6. Chronic diastolic CHF. Well compensated.   7. OSA on CPAP  8. Osteoarthritis of knee. S/p TKR  9. Elevated PSA. Cleared from our standpoint for prostate biopsy. May hold Xarelto for 2 days prior. SBE prophylaxis.    Current medicines are reviewed at length with the patient today.  The patient does not have concerns regarding medicines.  The following changes have been made:  no change  Labs/ tests ordered today include:   No orders of the defined types were placed in this encounter.   Disposition:   FU with me in 6 months  Signed, Derenda Giddings [place], MD  03/27/2023 1:43 PM    [facility] [address], [place], [place], [address] Phone [phone], Fax [phone]

## 2023-03-22 NOTE — Telephone Encounter (Signed)
Patient with diagnosis of atrial fibrillation on xarelto for anticoagulation.    Procedure:   MRI FUSION Bx   Date of Surgery:  Clearance TBD   CHA2DS2-VASc Score = 5   This indicates a 7.2% annual risk of stroke. The patient's score is based upon: CHF History: 1 HTN History: 1 Diabetes History: 0 Stroke History: 0 Vascular Disease History: 1 Age Score: 2 Gender Score: 0    CrCl 59 Platelet count 235  Per office protocol, patient can hold Xarelto for 3 days prior to procedure.   Patient will not need bridging with Lovenox (enoxaparin) around procedure.  **This guidance is not considered finalized until pre-operative APP has relayed final recommendations.**

## 2023-03-22 NOTE — Telephone Encounter (Signed)
   Name: Ivan Mccoy  DOB: 11-11-1935  MRN: [id number]  Primary Cardiologist: Peter Swaziland, MD   Preoperative team, please contact this patient and set up a phone call appointment for further preoperative risk assessment. Please obtain consent and complete medication review. Thank you for your help.Last seen by Dr. Swaziland on 10/11/2022.   I confirm that guidance regarding antiplatelet and oral anticoagulation therapy has been completed and, if necessary, noted below.  Per office protocol, patient can hold Xarelto for 3 days prior to procedure.   Patient will not need bridging with Lovenox (enoxaparin) around procedure.  I also confirmed the patient resides in the state of [place]. As per [facility] Medical Board telemedicine laws, the patient must reside in the state in which the provider is licensed.   Joni Reining, NP 03/22/2023, 4:47 PM [facility]

## 2023-03-23 ENCOUNTER — Other Ambulatory Visit: Payer: Self-pay | Admitting: Urology

## 2023-03-23 DIAGNOSIS — C61 Malignant neoplasm of prostate: Secondary | ICD-10-CM

## 2023-03-23 NOTE — Telephone Encounter (Signed)
Pt has appt 03/27/23 with Dr. Swaziland. Ok per preop APP to defer clearance to appt with MD. I will update all parties involved.

## 2023-03-27 ENCOUNTER — Encounter: Payer: Self-pay | Admitting: Cardiology

## 2023-03-27 ENCOUNTER — Other Ambulatory Visit: Payer: Self-pay | Admitting: Cardiology

## 2023-03-27 ENCOUNTER — Ambulatory Visit: Payer: Medicare Other | Attending: Cardiology | Admitting: Cardiology

## 2023-03-27 VITALS — BP 134/60 | Ht 69.0 in | Wt 189.0 lb

## 2023-03-27 DIAGNOSIS — I10 Essential (primary) hypertension: Secondary | ICD-10-CM | POA: Insufficient documentation

## 2023-03-27 DIAGNOSIS — Z952 Presence of prosthetic heart valve: Secondary | ICD-10-CM | POA: Insufficient documentation

## 2023-03-27 DIAGNOSIS — I25118 Atherosclerotic heart disease of native coronary artery with other forms of angina pectoris: Secondary | ICD-10-CM | POA: Diagnosis present

## 2023-03-27 DIAGNOSIS — I480 Paroxysmal atrial fibrillation: Secondary | ICD-10-CM | POA: Insufficient documentation

## 2023-03-27 DIAGNOSIS — Z01810 Encounter for preprocedural cardiovascular examination: Secondary | ICD-10-CM | POA: Diagnosis present

## 2023-03-27 MED ORDER — VALSARTAN 320 MG PO TABS: 320.0000 mg | ORAL_TABLET | Freq: Every day | ORAL | 3 refills | Status: DC

## 2023-03-27 NOTE — Patient Instructions (Signed)
 Medication Instructions:  Continue same medications *If you need a refill on your cardiac medications before your next appointment, please call your pharmacy*   Lab Work: None ordered   Testing/Procedures: None ordered   Follow-Up: At [facility], you and your health needs are our priority.  As part of our continuing mission to provide you with exceptional heart care, we have created designated Provider Care Teams.  These Care Teams include your primary Cardiologist (physician) and Advanced Practice Providers (APPs -  Physician Assistants and Nurse Practitioners) who all work together to provide you with the care you need, when you need it.  We recommend signing up for the patient portal called "MyChart".  Sign up information is provided on this After Visit Summary.  MyChart is used to connect with patients for Virtual Visits (Telemedicine).  Patients are able to view lab/test results, encounter notes, upcoming appointments, etc.  Non-urgent messages can be sent to your provider as well.   To learn more about what you can do with MyChart, go to [email or web address].    Your next appointment:  6 months    Call in March to schedule June appointment     Provider:  Dr.Jordan

## 2023-03-28 ENCOUNTER — Other Ambulatory Visit: Payer: Self-pay | Admitting: Cardiology

## 2023-04-14 NOTE — Telephone Encounter (Signed)
 I believe the issue is that the ov notes from Dr. Swaziland only reflects a hold of x 2 days prior for Xarelto and not the 3 days hold as requested and not ok by pharm-d ok to hold x 3 days.

## 2023-04-14 NOTE — Telephone Encounter (Signed)
 Per policy and recommendation from clinical pharmacist, Phillips Hay, [facility], Xarelto may be held for 3 days prior to procedure.  Levi Aland, NP-C  04/14/2023, 10:07 AM [address] Office [phone] Fax [phone]

## 2023-04-14 NOTE — Telephone Encounter (Signed)
 Clearance request faxed back to our office with question for clarification. I called and left message for Morrie Sheldon to call back to pre op team.

## 2023-04-14 NOTE — Telephone Encounter (Signed)
 S/w the [facility] at Dr. Ardena office  and confirmed question. Dr. Cam is requesting Xarelto  x 3 days hold. Dr. Jordan noted to hold x 2 days. Also asked about the SBE. Rosina states they do give the pt an ABX prior to Bx.  I assured Rosina that I will run this past the preop APP today if Dr. Jordan will give ok to hold Xarelto  x 3 days per request from Dr. Cam .

## 2023-04-17 ENCOUNTER — Other Ambulatory Visit: Payer: Self-pay

## 2023-04-17 MED ORDER — RIVAROXABAN 20 MG PO TABS: 20.0000 mg | ORAL_TABLET | Freq: Every day | ORAL | 1 refills | Status: DC

## 2023-04-17 NOTE — Telephone Encounter (Signed)
 Prescription refill request for Xarelto received.  Indication:AFIB Last office visit:12/24 Weight:85.7  kg Age:88 Scr:1.05  4/24 CrCl:62.35  ml/min  Prescription refilled

## 2023-04-17 NOTE — Telephone Encounter (Signed)
 Per Dr. Sw[place], patient may hold Xarelto for 3 days. Per his note from 03/27/2023, he agrees with SBE prophylaxis.   Levi Aland, NP-C  04/17/2023, 8:50 AM [address] Office [phone] Fax [phone]

## 2023-05-06 ENCOUNTER — Other Ambulatory Visit: Payer: Self-pay | Admitting: Cardiology

## 2023-05-09 ENCOUNTER — Ambulatory Visit
Admission: RE | Admit: 2023-05-09 | Discharge: 2023-05-09 | Disposition: A | Discharge status: Home / Self Care | Payer: Medicare Other | Source: Ambulatory Visit | Attending: Urology | Admitting: Urology

## 2023-05-09 DIAGNOSIS — C61 Malignant neoplasm of prostate: Secondary | ICD-10-CM

## 2023-05-09 MED ORDER — GADOPICLENOL 0.5 MMOL/ML IV SOLN
8.0000 mL | Freq: Once | INTRAVENOUS | Status: AC | PRN
  Administered 2023-05-09: 8 mL via INTRAVENOUS

## 2023-05-29 ENCOUNTER — Other Ambulatory Visit: Payer: Self-pay | Admitting: Cardiology

## 2023-05-30 ENCOUNTER — Other Ambulatory Visit: Payer: Self-pay | Admitting: Radiation Oncology

## 2023-05-30 ENCOUNTER — Inpatient Hospital Stay
Admission: RE | Admit: 2023-05-30 | Discharge: 2023-05-30 | Disposition: A | Discharge status: Home / Self Care | Payer: Self-pay | Source: Ambulatory Visit | Attending: Radiation Oncology | Admitting: Radiation Oncology

## 2023-05-30 DIAGNOSIS — C61 Malignant neoplasm of prostate: Secondary | ICD-10-CM

## 2023-05-30 NOTE — Progress Notes (Incomplete)
GU Location of Tumor / Histology: Prostate Ca  If Prostate Cancer, Gleason Score is (4 + 3) and PSA is (8.12 on 03/13/2023)  Ivan Mccoy presented as referral from Dr. Berniece Salines [facility]) for elevated PSA.  Biopsies     05/09/2023 Dr. Berniece Salines MR Prostate with/without Contrast CLINICAL DATA:  Elevated PSA level. R97.20. Gleason 3+4=7 prostate adenocarcinoma at the left lateral apex and Gleason 3+3=6 prostate adenocarcinoma at the left lateral base and left mid gland on biopsy dated 04/15/2022. C61.  IMPRESSION: 1. PI-RADS category 4 lesion of the left anterior and left posterolateral peripheral zone in the mid gland and apex. 2. PI-RADS category 3 lesion of the right anterior transition zone at the apex. 3.  Targeting data sent to [facility]. 4. Prostatomegaly and benign prostatic hypertrophy. 5. Sigmoid colon diverticulosis. 6. Lower lumbar degenerative facet arthropathy.    Past/Anticipated interventions by urology, if any:   Dr. Berniece Salines     Past/Anticipated interventions by medical oncology, if any: NA  Weight changes, if any: {:18581}  IPSS: SHIM:  Bowel/Bladder complaints, if any: {:18581}   Nausea/Vomiting, if any: {:18581}  Pain issues, if any:  {:18581}  SAFETY ISSUES: Prior radiation? {:18581} Pacemaker/ICD? {:18581} Possible current pregnancy? Male Is the patient on methotrexate? No  Current Complaints / other details:

## 2023-06-01 ENCOUNTER — Encounter: Payer: Self-pay | Admitting: Urology

## 2023-06-01 DIAGNOSIS — C61 Malignant neoplasm of prostate: Secondary | ICD-10-CM | POA: Insufficient documentation

## 2023-06-02 ENCOUNTER — Encounter: Payer: Self-pay | Admitting: Radiation Oncology

## 2023-06-02 ENCOUNTER — Ambulatory Visit
Admission: RE | Admit: 2023-06-02 | Discharge: 2023-06-02 | Disposition: A | Discharge status: Home / Self Care | Payer: Medicare Other | Source: Ambulatory Visit | Attending: Radiation Oncology | Admitting: Radiation Oncology

## 2023-06-02 VITALS — BP 134/63 | HR 64 | Temp 97.7°F | Resp 20 | Ht 69.0 in | Wt 190.6 lb

## 2023-06-02 DIAGNOSIS — C61 Malignant neoplasm of prostate: Secondary | ICD-10-CM | POA: Insufficient documentation

## 2023-06-02 DIAGNOSIS — Z7901 Long term (current) use of anticoagulants: Secondary | ICD-10-CM | POA: Diagnosis not present

## 2023-06-02 DIAGNOSIS — R011 Cardiac murmur, unspecified: Secondary | ICD-10-CM | POA: Insufficient documentation

## 2023-06-02 DIAGNOSIS — Z79899 Other long term (current) drug therapy: Secondary | ICD-10-CM | POA: Diagnosis not present

## 2023-06-02 DIAGNOSIS — I509 Heart failure, unspecified: Secondary | ICD-10-CM | POA: Diagnosis not present

## 2023-06-02 DIAGNOSIS — I2510 Atherosclerotic heart disease of native coronary artery without angina pectoris: Secondary | ICD-10-CM | POA: Insufficient documentation

## 2023-06-02 DIAGNOSIS — I252 Old myocardial infarction: Secondary | ICD-10-CM | POA: Diagnosis not present

## 2023-06-02 DIAGNOSIS — Z8701 Personal history of pneumonia (recurrent): Secondary | ICD-10-CM | POA: Diagnosis not present

## 2023-06-02 DIAGNOSIS — M47816 Spondylosis without myelopathy or radiculopathy, lumbar region: Secondary | ICD-10-CM | POA: Insufficient documentation

## 2023-06-02 DIAGNOSIS — I110 Hypertensive heart disease with heart failure: Secondary | ICD-10-CM | POA: Diagnosis not present

## 2023-06-02 DIAGNOSIS — I480 Paroxysmal atrial fibrillation: Secondary | ICD-10-CM | POA: Diagnosis not present

## 2023-06-02 DIAGNOSIS — G4730 Sleep apnea, unspecified: Secondary | ICD-10-CM | POA: Insufficient documentation

## 2023-06-02 DIAGNOSIS — K5730 Diverticulosis of large intestine without perforation or abscess without bleeding: Secondary | ICD-10-CM | POA: Insufficient documentation

## 2023-06-02 DIAGNOSIS — E785 Hyperlipidemia, unspecified: Secondary | ICD-10-CM | POA: Diagnosis not present

## 2023-06-02 NOTE — Progress Notes (Signed)
 Introduced myself to the patient, and his wife, as the prostate nurse navigator.  No barriers to care identified at this time.  He is here to discuss his radiation treatment options.  I gave him my business card and asked him to call me with questions or concerns.  Verbalized understanding.

## 2023-06-02 NOTE — Progress Notes (Signed)
Radiation Oncology         [phone] ________________________________  Initial Outpatient Consultation  Name: Ivan Mccoy MRN: [id number]  Date: 06/02/2023  DOB: [birth year]  WU:[id number], [facility], Earle Gell, MD   REFERRING PHYSICIAN: Crist Fat, MD  DIAGNOSIS: 88 y.o. gentleman with Stage T1c adenocarcinoma of the prostate with Gleason score of 4+3, and PSA of 8.12.    ICD-10-CM   1. Malignant neoplasm of prostate (HCC)  C61       HISTORY OF PRESENT ILLNESS: Ivan Mccoy is a 88 y.o. male with a diagnosis of prostate cancer. He has a longstanding history of elevated PSA since at least 2020 when he was initially referred to Dr. Marlou Porch with a PSA of 5.07.  Digital rectal exam was normal so they elected to continue to monitor the PSA.  Repeat PSAs have remained stable, elevated in the 5s through 2022.  He had a prostate MRI in February 2022 that showed a PI-RADS 3 lesion in the right anterior apex.  A repeat PSA in December 2022 was 5.57 and repeat prostate MRI on 03/15/2021 now showed a PI-RADS 4 lesion in the right lateral mid zone but unchanged in size so the decision was to continue monitoring the PSA.  The PSA has continued to gradually rise since that time, at 6.41 in June 2023 and 8.32 in December 2023.  He subsequently had a fusion biopsy on 04/15/2022 with 15 biopsies taken.  The prostate volume measured 65 cc and out of the 15 core biopsies, 3 were positive, with the highest Gleason score being 3+4, seen in the left apex lateral.  Additionally, there was Gleason 3+3 in the left base lateral and left mid and all 3 cores from the MRI ROI were negative.   He elected to proceed with active surveillance and a repeat PSA in December 2024 remained stable elevated at 8.12.  He had a surveillance prostate MRI on 05/09/2023 that now showed a PI-RADS 4 and a PI-RADS 3 lesion.  Therefore, the patient proceeded to repeat MRI fusion transrectal ultrasound with 18 biopsies  of the prostate on 05/16/2023.  The prostate volume measured 54 cc.  Out of 18 core biopsies, 10 were positive.  The maximum Gleason score was 4+3, and this was seen in the left apex lateral and right mid.  Additionally, there was Gleason 3+4 in the left apex and 3 of 3 cores from the MRI ROI #1 and Gleason 3+3 in the left base lateral, left mid, left mid lateral and right mid lateral.  All samples from the MRI ROI #2 were negative.  The patient reviewed the biopsy results with his urologist and he has kindly been referred today for discussion of potential radiation treatment options.  A Decipher genomic test was sent on 05/26/2023 and results remain pending.   PREVIOUS RADIATION THERAPY: No  PAST MEDICAL HISTORY:  Past Medical History:  Diagnosis Date   Arrhythmia    paroxysmal afib   Arthritis    Cancer (HCC)    skin cancer   CHF (congestive heart failure) (HCC)    Coronary artery disease    Heart murmur    Hyperlipidemia    Hypertension    Macular degeneration    Myocardial infarction (HCC)    Obstructive sleep apnea    cpap   Pneumonia    hx of   Wet age-related macular degeneration of both eyes with inactive choroidal neovascularization (HCC)    followed by DR Harrold Donath  H[place]      PAST SURGICAL HISTORY: Past Surgical History:  Procedure Laterality Date   APPENDECTOMY     CARDIAC CATHETERIZATION N/A 08/31/2015   Procedure: Left Heart Cath and Coronary Angiography;  Surgeon: Peter M Swaziland, MD;  Location: [facility] INVASIVE CV LAB;  Service: Cardiovascular;  Laterality: N/A;   CARDIAC CATHETERIZATION N/A 08/31/2015   Procedure: Coronary Stent Intervention;  Surgeon: Peter M Swaziland, MD;  Location: [facility] INVASIVE CV LAB;  Service: Cardiovascular;  Laterality: N/A;   CORONARY STENT INTERVENTION     excision of squamous cell ca Right    cheek   HEMATOMA EVACUATION     in left leg - 2012   HERNIA REPAIR     meniscus tear surgery      MITRAL VALVE REPLACEMENT  12/21/2015   #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    PROSTATE BIOPSY     TONSILLECTOMY AND ADENOIDECTOMY     TOTAL KNEE ARTHROPLASTY Left 07/11/2022   Procedure: TOTAL KNEE ARTHROPLASTY;  Surgeon: Ollen Gross, MD;  Location: [facility] ORS;  Service: Orthopedics;  Laterality: Left;    FAMILY HISTORY:  Family History  Problem Relation Age of Onset   Heart attack Father     SOCIAL HISTORY:  Social History   Socioeconomic History   Marital status: Married    Spouse name: Not on file   Number of children: Not on file   Years of education: Not on file   Highest education level: Not on file  Occupational History   Not on file  Tobacco Use   Smoking status: Never   Smokeless tobacco: Never  Vaping Use   Vaping status: Never Used  Substance and Sexual Activity   Alcohol use: Yes    Comment: occ beer or wine   Drug use: Never   Sexual activity: Yes    Partners: Female  Other Topics Concern   Not on file  Social History Narrative   Not on file   Social Drivers of Health   Financial Resource Strain: Not on file  Food Insecurity: No Food Insecurity (07/11/2022)   Hunger Vital Sign    Worried About Running Out of Food in the Last Year: Never true    Ran Out of Food in the Last Year: Never true  Transportation Needs: No Transportation Needs (07/11/2022)   PRAPARE - Administrator, Civil Service (Medical): No    Lack of Transportation (Non-Medical): No  Physical Activity: Not on file  Stress: Not on file  Social Connections: Not on file  Intimate Partner Violence: Not At Risk (07/11/2022)   Humiliation, Afraid, Rape, and Kick questionnaire    Fear of Current or Ex-Partner: No    Emotionally Abused: No    Physically Abused: No    Sexually Abused: No    ALLERGIES: Other, Peanut butter flavoring agent (non-screening), Peanut oil, Peanut-containing drug products, Sulfa antibiotics, Eliquis [apixaban], and Spironolactone  MEDICATIONS:  Current Outpatient Medications  Medication Sig Dispense  Refill   acetaminophen (TYLENOL) 500 MG tablet Take 500-1,000 mg by mouth as needed (pain.).     amLODipine (NORVASC) 10 MG tablet TAKE ONE (1) TABLET BY MOUTH EACH DAY 90 tablet 1   atorvastatin (LIPITOR) 40 MG tablet TAKE 1 TABLET BY MOUTH DAILY 90 tablet 3   carvedilol (COREG) 25 MG tablet TAKE ONE TABLET BY MOUTH TWICE DAILY 180 tablet 2   Cholecalciferol (VITAMIN D3) 25 MCG (1000 UT) CAPS Take 1,000 Units by mouth 3 (three) times daily.  EPINEPHRINE 0.3 mg/0.3 mL IJ SOAJ injection INJECT INTO MUSCLE ONCE FOR ALLERGIC REACTION 2 each 2   eplerenone (INSPRA) 50 MG tablet TAKE 1 TABLET BY MOUTH DAILY 90 tablet 3   fenofibrate 160 MG tablet TAKE ONE (1) TABLET BY MOUTH EVERY DAY 90 tablet 3   FERGON 240 (27 Fe) MG tablet Take 1 tablet by mouth every 3 (three) days.     furosemide (LASIX) 20 MG tablet TAKE ONE (1) TABLET BY MOUTH EVERY DAY 90 tablet 3   GLUCOSAMINE HCL PO Take 1 capsule by mouth 3 (three) times daily.     isosorbide mononitrate (IMDUR) 30 MG 24 hr tablet TAKE 1 TABLET BY MOUTH DAILY 90 tablet 3   metroNIDAZOLE (METROCREAM) 0.75 % cream Apply 1 application topically every morning. APPLY TO FACE     Multiple Vitamin (MULTIVITAMIN WITH MINERALS) TABS tablet Take 1 tablet by mouth in the morning. Centrum Silver     Multiple Vitamins-Minerals (PRESERVISION AREDS) TABS Take 1 tablet by mouth in the morning and at bedtime.     potassium chloride SA (KLOR-CON M) 20 MEQ tablet TAKE 1 TABLET BY MOUTH DAILY 90 tablet 3   Probiotic Product (PROBIOTIC DAILY) CAPS Take 1 capsule by mouth at bedtime.     Psyllium (METAMUCIL FIBER PO) Take 1 Dose by mouth every morning. powder     rivaroxaban (XARELTO) 20 MG TABS tablet Take 1 tablet (20 mg total) by mouth daily with supper. 90 tablet 1   tamsulosin (FLOMAX) 0.4 MG CAPS capsule Take 0.4 mg by mouth.     valsartan (DIOVAN) 320 MG tablet Take 1 tablet (320 mg total) by mouth daily. 90 tablet 3   No current facility-administered medications  for this encounter.    REVIEW OF SYSTEMS:  On review of systems, the patient reports that he is doing well overall. He denies any chest pain, shortness of breath, cough, fevers, chills, night sweats, unintended weight changes. He denies any bowel disturbances, and denies abdominal pain, nausea or vomiting. He denies any new musculoskeletal or joint aches or pains. His IPSS was 19, indicating moderate-severe urinary symptoms despite taking Flomax daily as prescribed. His SHIM was 5, indicating he has severe erectile dysfunction but reports that this is not a high priority as he and his wife are not currently sexually active and have not been for several years. A complete review of systems is obtained and is otherwise negative.    PHYSICAL EXAM:  Wt Readings from Last 3 Encounters:  06/02/23 190 lb 9.6 oz (86.5 kg)  03/27/23 189 lb (85.7 kg)  10/11/22 179 lb (81.2 kg)   Temp Readings from Last 3 Encounters:  06/02/23 97.7 F (36.5 C)  07/12/22 99 F (37.2 C) (Oral)  06/29/22 97.8 F (36.6 C) (Oral)   BP Readings from Last 3 Encounters:  06/02/23 134/63  03/27/23 134/60  10/11/22 136/74   Pulse Readings from Last 3 Encounters:  06/02/23 64  10/11/22 63  07/12/22 73    /10  In general this is a well appearing Caucasian male in no acute distress. He's alert and oriented x4 and appropriate throughout the examination. Cardiopulmonary assessment is negative for acute distress, and he exhibits normal effort.    KPS = 100  100 - Normal; no complaints; no evidence of disease. 90   - Able to carry on normal activity; minor signs or symptoms of disease. 80   - Normal activity with effort; some signs or symptoms of disease. 70   -  Cares for self; unable to carry on normal activity or to do active work. 60   - Requires occasional assistance, but is able to care for most of his personal needs. 50   - Requires considerable assistance and frequent medical care. 40   - Disabled; requires  special care and assistance. 30   - Severely disabled; hospital admission is indicated although death not imminent. 20   - Very sick; hospital admission necessary; active supportive treatment necessary. 10   - Moribund; fatal processes progressing rapidly. 0     - Dead  Karnofsky DA, Abelmann WH, Craver LS and Burchenal [facility] [phone]) The use of the nitrogen mustards in the palliative treatment of carcinoma: with particular reference to bronchogenic carcinoma Cancer 1 634-56  LABORATORY DATA:  Lab Results  Component Value Date   WBC 12.3 (H) 07/12/2022   HGB 11.5 (L) 07/12/2022   HCT 33.7 (L) 07/12/2022   MCV 95.5 07/12/2022   PLT 187 07/12/2022   Lab Results  Component Value Date   NA 140 07/12/2022   K 3.8 07/12/2022   CL 111 07/12/2022   CO2 23 07/12/2022   Lab Results  Component Value Date   ALT 15 (L) 08/24/2015   AST 26 08/24/2015   ALKPHOS 35 (L) 08/24/2015   BILITOT 1.7 (H) 08/24/2015     RADIOGRAPHY: MR PROSTATE W WO CONTRAST Result Date: 05/09/2023 CLINICAL DATA:  Elevated PSA level. R97.20. Gleason 3+4=7 prostate adenocarcinoma at the left lateral apex and Gleason 3+3=6 prostate adenocarcinoma at the left lateral base and left mid gland on biopsy dated 04/15/2022. C61. EXAM: MR PROSTATE WITHOUT AND WITH CONTRAST TECHNIQUE: Multiplanar multisequence MRI images were obtained of the pelvis centered about the prostate. Pre and post contrast images were obtained. CONTRAST:  8 cc Vueway COMPARISON:  03/15/2021 FINDINGS: Prostate: Encapsulated nodularity in the transition zone compatible with benign prostatic hypertrophy. Region of interest # 1: PI-RADS category 4 lesion of the left anterior and left posterolateral peripheral zone in the mid gland and apex with focally reduced irregular T2 signal (image 47 series 9) corresponding to reduced ADC map activity and mildly restricted diffusion (image 16 of series 6 and 7) and focal early enhancement (image 184 series 12). This measures  0.69 cc (1.5 by 0.5 by 1.3 cm). Region of interest # 2: PI-RADS category 3 lesion of the right anterior transition zone at the apex with sharply defined reduced T2 signal (image 54 series 9) corresponding to reduced ADC map activity and restricted diffusion (image 18 of series 6 and 7). This measures 0.26 cc (1.1 by 0.7 by 0 5 cm). Volume: 3D volumetric assessment: Prostate volume 59.4 cc (6.2 by 4.3 by 5.1 cm). Transcapsular spread: Absent Seminal vesicle involvement: Absent Neurovascular bundle involvement: Absent Pelvic adenopathy: Absent Bone metastasis: Absent Other findings: Sigmoid colon diverticulosis. Lower lumbar degenerative facet arthropathy. Asymmetric accentuated T1 signal in the left seminal vesicle likely representing proteinaceous contents. IMPRESSION: 1. PI-RADS category 4 lesion of the left anterior and left posterolateral peripheral zone in the mid gland and apex. 2. PI-RADS category 3 lesion of the right anterior transition zone at the apex. 3.  Targeting data sent to [facility]. 4. Prostatomegaly and benign prostatic hypertrophy. 5. Sigmoid colon diverticulosis. 6. Lower lumbar degenerative facet arthropathy. Electronically Signed   By: Gaylyn Rong M.D.   On: 05/09/2023 15:07      IMPRESSION/PLAN: 1. 88 y.o. gentleman with Stage T1c adenocarcinoma of the prostate with Gleason Score of 4+3, and PSA of 8.12.  We discussed the patient's workup and outlined the nature of prostate cancer in this setting. The patient's T stage, Gleason's score, and PSA put him into the unfavorable intermediate risk group.  We discussed the role of PSMA PET imaging to complete disease staging and he is in favor of this.  Pending there are no unexpected findings on the PSMA PET scan, he is eligible for a variety of potential treatment options including brachytherapy, 5.5 weeks of external radiation or prostatectomy. We discussed the available radiation techniques, and focused on the details and logistics of  delivery. The patient is not an ideal candidate for brachytherapy with an IPSS score indicating moderate-sever LUTS despite medical therapy.  Therefore, we discussed and outlined the risks, benefits, short and long-term effects associated with daily external beam radiotherapy and compared and contrasted these with prostatectomy. We discussed the role of SpaceOAR gel in reducing the rectal toxicity associated with radiotherapy. He was encouraged to ask questions that were answered to his stated satisfaction.   At the conclusion of our conversation, pending there are no unexpected findings on PSMA PET imaging, the patient is interested in moving forward with 5.5 weeks of external beam therapy. We will share our discussion with Dr. Marlou Porch and make arrangements for the PSMA PET scan to be performed in 06/2023, after they return from their 3-week vacation to [facility]. Daphine Deutscher.  We will also coordinate for fiducial markers and SpaceOAR gel placement in early May 2025, prior to simulation, to reduce rectal toxicity from radiotherapy. The patient appears to have a good understanding of his disease and our treatment recommendations which are of curative intent and is in agreement with the stated plan.  Therefore, we will move forward with treatment planning accordingly, in anticipation of beginning IMRT in the latter part of May 2025, once they have returned from a planned family weddings/vacations.  We truly enjoyed meeting him and his wife today and look forward to continuing to participate in his care. He We personally spent 70 minutes in this encounter including chart review, reviewing radiological studies, meeting face-to-face with the patient, entering orders and completing documentation.    Marguarite Arbour, PA-C    Margaretmary Dys, MD  [facility]  Radiation Oncology Direct Dial: [phone]  Fax: [phone] [email or web address]  Skype  LinkedIn

## 2023-06-05 ENCOUNTER — Telehealth: Payer: Self-pay | Admitting: *Deleted

## 2023-06-05 NOTE — Telephone Encounter (Signed)
 CALLED PATIENT TO INFORM OF PET FOR 06-27-23- ARRIVAL TIME- 1:15 PM @ [facility], INFORMED PATIENT TO ARRIVE WELL HYDRATED, LVM FOR A RETURN CALL

## 2023-06-06 ENCOUNTER — Telehealth: Payer: Self-pay | Admitting: *Deleted

## 2023-06-06 NOTE — Telephone Encounter (Signed)
 CALLED PATIENT TO INFORM OF PET FOR 06-27-23, LVM FOR A RETURN CALL

## 2023-06-27 ENCOUNTER — Encounter (HOSPITAL_COMMUNITY)
Admission: RE | Admit: 2023-06-27 | Discharge: 2023-06-27 | Disposition: A | Discharge status: Home / Self Care | Payer: Medicare Other | Source: Ambulatory Visit | Attending: Urology | Admitting: Urology

## 2023-06-27 DIAGNOSIS — C61 Malignant neoplasm of prostate: Secondary | ICD-10-CM | POA: Insufficient documentation

## 2023-06-27 MED ORDER — FLOTUFOLASTAT F 18 GALLIUM 296-5846 MBQ/ML IV SOLN
7.9600 | Freq: Once | INTRAVENOUS | Status: AC
  Administered 2023-06-27: 7.96 via INTRAVENOUS

## 2023-07-03 ENCOUNTER — Telehealth: Payer: Self-pay | Admitting: *Deleted

## 2023-07-03 NOTE — Progress Notes (Signed)
 Telephone nursing appointment for review of most recent PSMA-scan results. I verified patient's identity x2 and began nursing interview.   Patient reports .  Dysuria none Hematuria  none Nocturia x1 Stream moderate Empty bladder denies  Bowels denies        Patient denies any other related issues at this time.   Meaningful use complete.   Patient aware of their telephone appointment w/ Ashlyn Bruning PA-C. I left my extension [phone] in case patient needs anything. Patient verbalized understanding. This concludes the nursing interview.   Patient preferred phone # [phone]   This concludes the interaction.

## 2023-07-03 NOTE — Progress Notes (Signed)
 STAT read requested on PSMA PET prior to upcoming follow up appointment.

## 2023-07-03 NOTE — Telephone Encounter (Signed)
 RETURNED PATIENT'S PHONE CALL, SPOKE WITH PATIENT. ?

## 2023-07-05 ENCOUNTER — Other Ambulatory Visit: Payer: Self-pay | Admitting: Urology

## 2023-07-05 ENCOUNTER — Ambulatory Visit
Admission: RE | Admit: 2023-07-05 | Discharge: 2023-07-05 | Disposition: A | Discharge status: Home / Self Care | Source: Ambulatory Visit | Attending: Urology | Admitting: Urology

## 2023-07-05 ENCOUNTER — Telehealth: Payer: Self-pay | Admitting: Cardiology

## 2023-07-05 DIAGNOSIS — C61 Malignant neoplasm of prostate: Secondary | ICD-10-CM

## 2023-07-05 NOTE — Progress Notes (Signed)
 Radiation Oncology         [phone] ________________________________  Outpatient follow-up visit via telephone  Name: Ivan Mccoy MRN: [id number]  Date: 07/05/2023  DOB: 1935/08/24  WJ:XBJYN, [facility], Earle Gell, MD   REFERRING PHYSICIAN: Crist Fat, MD  DIAGNOSIS: 88 y.o. gentleman with Stage T1c adenocarcinoma of the prostate with Gleason score of 4+3, and PSA of 8.12.    ICD-10-CM   1. Malignant neoplasm of prostate (HCC)  C61       HISTORY OF PRESENT ILLNESS: Ivan Mccoy is a 88 y.o. male with a diagnosis of prostate cancer. He has a longstanding history of elevated PSA since at least 2020 when he was initially referred to Dr. Marlou Porch with a PSA of 5.07.  Digital rectal exam was normal so they elected to continue to monitor the PSA.  Repeat PSAs have remained stable, elevated in the 5s through 2022.  He had a prostate MRI in February 2022 that showed a PI-RADS 3 lesion in the right anterior apex.  A repeat PSA in December 2022 was 5.57 and repeat prostate MRI on 03/15/2021 now showed a PI-RADS 4 lesion in the right lateral mid zone but unchanged in size so the decision was to continue monitoring the PSA.  The PSA has continued to gradually rise since that time, at 6.41 in June 2023 and 8.32 in December 2023.  He subsequently had a fusion biopsy on 04/15/2022 with 15 biopsies taken.  The prostate volume measured 65 cc and out of the 15 core biopsies, 3 were positive, with the highest Gleason score being 3+4, seen in the left apex lateral.  Additionally, there was Gleason 3+3 in the left base lateral and left mid and all 3 cores from the MRI ROI were negative.   He elected to proceed with active surveillance and a repeat PSA in December 2024 remained stable elevated at 8.12.  He had a surveillance prostate MRI on 05/09/2023 that now showed a PI-RADS 4 and a PI-RADS 3 lesion.  Therefore, the patient proceeded to repeat MRI fusion transrectal ultrasound with 18  biopsies of the prostate on 05/16/2023.  The prostate volume measured 54 cc.  Out of 18 core biopsies, 10 were positive.  The maximum Gleason score was 4+3, and this was seen in the left apex lateral and right mid.  Additionally, there was Gleason 3+4 in the left apex and 3 of 3 cores from the MRI ROI #1 and Gleason 3+3 in the left base lateral, left mid, left mid lateral and right mid lateral.  All samples from the MRI ROI #2 were negative. A Decipher genomic test was sent on 05/26/2023   We initially met with the patient on 06/02/2023 to discuss potential radiation treatment options and recommended he complete disease staging with a PSMA PET scan.  The imaging was performed on 06/27/2023 showing focal radiotracer accumulation in the left posterior lateral apex and mid gland and in the right central base, consistent with primary prostate cancer.  Fortunately, there was no evidence of disease outside of the prostate.  We reviewed these results by telephone today.  PREVIOUS RADIATION THERAPY: No  PAST MEDICAL HISTORY:  Past Medical History:  Diagnosis Date   Arrhythmia    paroxysmal afib   Arthritis    Cancer (HCC)    skin cancer   CHF (congestive heart failure) (HCC)    Coronary artery disease    Heart murmur    Hyperlipidemia    Hypertension  Macular degeneration    Myocardial infarction (HCC)    Obstructive sleep apnea    cpap   Pneumonia    hx of   Wet age-related macular degeneration of both eyes with inactive choroidal neovascularization (HCC)    followed by DR [name]      PAST SURGICAL HISTORY: Past Surgical History:  Procedure Laterality Date   APPENDECTOMY     CARDIAC CATHETERIZATION N/A 08/31/2015   Procedure: Left Heart Cath and Coronary Angiography;  Surgeon: Peter M Swaziland, MD;  Location: [facility] INVASIVE CV LAB;  Service: Cardiovascular;  Laterality: N/A;   CARDIAC CATHETERIZATION N/A 08/31/2015   Procedure: Coronary Stent Intervention;  Surgeon: Peter M Swaziland, MD;   Location: [facility] INVASIVE CV LAB;  Service: Cardiovascular;  Laterality: N/A;   CORONARY STENT INTERVENTION     excision of squamous cell ca Right    cheek   HEMATOMA EVACUATION     in left leg - 2012   HERNIA REPAIR     meniscus tear surgery      MITRAL VALVE REPLACEMENT  12/21/2015   #27 Carpentier Edwards pericardial valve.    PROSTATE BIOPSY     TONSILLECTOMY AND ADENOIDECTOMY     TOTAL KNEE ARTHROPLASTY Left 07/11/2022   Procedure: TOTAL KNEE ARTHROPLASTY;  Surgeon: Ollen Gross, MD;  Location: [facility] ORS;  Service: Orthopedics;  Laterality: Left;    FAMILY HISTORY:  Family History  Problem Relation Age of Onset   Heart attack Father     SOCIAL HISTORY:  Social History   Socioeconomic History   Marital status: Married    Spouse name: Not on file   Number of children: Not on file   Years of education: Not on file   Highest education level: Not on file  Occupational History   Not on file  Tobacco Use   Smoking status: Never   Smokeless tobacco: Never  Vaping Use   Vaping status: Never Used  Substance and Sexual Activity   Alcohol use: Yes    Comment: occ beer or wine   Drug use: Never   Sexual activity: Not Currently    Partners: Female  Other Topics Concern   Not on file  Social History Narrative   Not on file   Social Drivers of Health   Financial Resource Strain: Not on file  Food Insecurity: No Food Insecurity (06/02/2023)   Hunger Vital Sign    Worried About Running Out of Food in the Last Year: Never true    Ran Out of Food in the Last Year: Never true  Transportation Needs: No Transportation Needs (06/02/2023)   PRAPARE - Administrator, Civil Service (Medical): No    Lack of Transportation (Non-Medical): No  Physical Activity: Not on file  Stress: Not on file  Social Connections: Not on file  Intimate Partner Violence: Not At Risk (06/02/2023)   Humiliation, Afraid, Rape, and Kick questionnaire    Fear of Current or Ex-Partner: No     Emotionally Abused: No    Physically Abused: No    Sexually Abused: No    ALLERGIES: Other, Peanut butter flavoring agent (non-screening), Peanut oil, Peanut-containing drug products, Sulfa antibiotics, Eliquis [apixaban], and Spironolactone  MEDICATIONS:  Current Outpatient Medications  Medication Sig Dispense Refill   acetaminophen (TYLENOL) 500 MG tablet Take 500-1,000 mg by mouth as needed (pain.).     amLODipine (NORVASC) 10 MG tablet TAKE ONE (1) TABLET BY MOUTH EACH DAY 90 tablet 1   atorvastatin (LIPITOR) 40  MG tablet TAKE 1 TABLET BY MOUTH DAILY 90 tablet 3   carvedilol (COREG) 25 MG tablet TAKE ONE TABLET BY MOUTH TWICE DAILY 180 tablet 2   Cholecalciferol (VITAMIN D3) 25 MCG (1000 UT) CAPS Take 1,000 Units by mouth 3 (three) times daily.     EPINEPHRINE 0.3 mg/0.3 mL IJ SOAJ injection INJECT INTO MUSCLE ONCE FOR ALLERGIC REACTION 2 each 2   eplerenone (INSPRA) 50 MG tablet TAKE 1 TABLET BY MOUTH DAILY 90 tablet 3   fenofibrate 160 MG tablet TAKE ONE (1) TABLET BY MOUTH EVERY DAY 90 tablet 3   FERGON 240 (27 Fe) MG tablet Take 1 tablet by mouth every 3 (three) days.     furosemide (LASIX) 20 MG tablet TAKE ONE (1) TABLET BY MOUTH EVERY DAY 90 tablet 3   GLUCOSAMINE HCL PO Take 1 capsule by mouth 3 (three) times daily.     isosorbide mononitrate (IMDUR) 30 MG 24 hr tablet TAKE 1 TABLET BY MOUTH DAILY 90 tablet 3   metroNIDAZOLE (METROCREAM) 0.75 % cream Apply 1 application topically every morning. APPLY TO FACE     Multiple Vitamin (MULTIVITAMIN WITH MINERALS) TABS tablet Take 1 tablet by mouth in the morning. Centrum Silver     Multiple Vitamins-Minerals (PRESERVISION AREDS) TABS Take 1 tablet by mouth in the morning and at bedtime.     potassium chloride SA (KLOR-CON M) 20 MEQ tablet TAKE 1 TABLET BY MOUTH DAILY 90 tablet 3   Probiotic Product (PROBIOTIC DAILY) CAPS Take 1 capsule by mouth at bedtime.     Psyllium (METAMUCIL FIBER PO) Take 1 Dose by mouth every morning. powder      rivaroxaban (XARELTO) 20 MG TABS tablet Take 1 tablet (20 mg total) by mouth daily with supper. 90 tablet 1   tamsulosin (FLOMAX) 0.4 MG CAPS capsule Take 0.4 mg by mouth.     valsartan (DIOVAN) 320 MG tablet Take 1 tablet (320 mg total) by mouth daily. 90 tablet 3   No current facility-administered medications for this encounter.    REVIEW OF SYSTEMS:  On review of systems, the patient reports that he is doing well overall. He denies any chest pain, shortness of breath, cough, fevers, chills, night sweats, unintended weight changes. He denies any bowel disturbances, and denies abdominal pain, nausea or vomiting. He denies any new musculoskeletal or joint aches or pains. His IPSS was 19, indicating moderate-severe urinary symptoms despite taking Flomax daily as prescribed. His SHIM was 5, indicating he has severe erectile dysfunction but reports that this is not a high priority as he and his wife are not currently sexually active and have not been for several years. A complete review of systems is obtained and is otherwise negative.    PHYSICAL EXAM:  Wt Readings from Last 3 Encounters:  06/02/23 190 lb 9.6 oz (86.5 kg)  03/27/23 189 lb (85.7 kg)  10/11/22 179 lb (81.2 kg)   Temp Readings from Last 3 Encounters:  06/02/23 97.7 F (36.5 C)  07/12/22 99 F (37.2 C) (Oral)  06/29/22 97.8 F (36.6 C) (Oral)   BP Readings from Last 3 Encounters:  06/02/23 134/63  03/27/23 134/60  10/11/22 136/74   Pulse Readings from Last 3 Encounters:  06/02/23 64  10/11/22 63  07/12/22 73    /10  Unable to assess due to telephone follow-up visit format.  KPS = 100  100 - Normal; no complaints; no evidence of disease. 90   - Able to carry on normal activity;  minor signs or symptoms of disease. 80   - Normal activity with effort; some signs or symptoms of disease. 37   - Cares for self; unable to carry on normal activity or to do active work. 60   - Requires occasional assistance, but is  able to care for most of his personal needs. 50   - Requires considerable assistance and frequent medical care. 40   - Disabled; requires special care and assistance. 30   - Severely disabled; hospital admission is indicated although death not imminent. 20   - Very sick; hospital admission necessary; active supportive treatment necessary. 10   - Moribund; fatal processes progressing rapidly. 0     - Dead  Karnofsky DA, Abelmann WH, Craver LS and Burchenal [facility] [phone]) The use of the nitrogen mustards in the palliative treatment of carcinoma: with particular reference to bronchogenic carcinoma Cancer 1 634-56  LABORATORY DATA:  Lab Results  Component Value Date   WBC 12.3 (H) 07/12/2022   HGB 11.5 (L) 07/12/2022   HCT 33.7 (L) 07/12/2022   MCV 95.5 07/12/2022   PLT 187 07/12/2022   Lab Results  Component Value Date   NA 140 07/12/2022   K 3.8 07/12/2022   CL 111 07/12/2022   CO2 23 07/12/2022   Lab Results  Component Value Date   ALT 15 (L) 08/24/2015   AST 26 08/24/2015   ALKPHOS 35 (L) 08/24/2015   BILITOT 1.7 (H) 08/24/2015     RADIOGRAPHY: NM PET (PSMA) SKULL TO MID THIGH Result Date: 07/03/2023 CLINICAL DATA:  Unfavorable intermediate risk prostate carcinoma. Staging. EXAM: NUCLEAR MEDICINE PET SKULL BASE TO THIGH TECHNIQUE: 8.0 mCi Flotufolastat (Posluma) was injected intravenously. Full-ring PET imaging was performed from the skull base to thigh after the radiotracer. CT data was obtained and used for attenuation correction and anatomic localization. COMPARISON:  None Available. FINDINGS: NECK No radiotracer activity in neck lymph nodes. Incidental CT finding: None. CHEST No radiotracer accumulation within mediastinal or hilar lymph nodes. No suspicious pulmonary nodules on the CT scan. Incidental CT finding: None. ABDOMEN/PELVIS Prostate: Mildly enlarged prostate. Focal radiotracer accumulation is seen in the left posterior and lateral apex and mid gland, with SUV max 4.3.  Focal radiotracer accumulation is also seen in the central right base, with SUV max of 6.1. These findings are consistent with primary prostate carcinoma. Lymph nodes: No abnormal radiotracer accumulation within pelvic or abdominal nodes. Liver: No evidence of liver metastasis. Incidental CT finding: Small hepatic cysts noted. Diverticulosis is seen mainly involving the sigmoid colon, however there is no evidence of diverticulitis. 6 mm calculus in urinary bladder. SKELETON No focal activity to suggest skeletal metastasis. IMPRESSION: Focal radiotracer accumulation in the left posterolateral apex and mid gland, and right central base, consistent with primary prostate carcinoma. No evidence of metastatic disease. 6 mm urinary bladder calculus. Colonic diverticulosis, without radiographic evidence of diverticulitis. Electronically Signed   By: Danae Orleans M.D.   On: 07/03/2023 17:13      IMPRESSION/PLAN: 1. 88 y.o. gentleman with Stage T1c adenocarcinoma of the prostate with Gleason Score of 4+3, and PSA of 8.12. We reviewed the results of his recent PSMA PET imaging that confirmed localized prostate cancer only indicating he remains an excellent candidate for a 5.5 week course of daily external beam radiation for definitive treatment. The patient is not an ideal candidate for brachytherapy with an IPSS score indicating moderate-severe LUTS despite medical therapy.  We briefly reviewed the risks, benefits, short and long-term effects  associated with daily external beam radiotherapy and compared and contrasted these with prostatectomy.  He was encouraged to ask questions that were answered to his stated satisfaction.   At the conclusion of our conversation, the patient remains interested in moving forward with the recommended 5.5 week course of daily external beam therapy. We will share our discussion with Dr. Marlou Porch and make arrangements for fiducial markers and SpaceOAR gel placement in early May 2025, prior  to simulation, to reduce rectal toxicity from radiotherapy. The patient appears to have a good understanding of his disease and our treatment recommendations which are of curative intent and is in agreement with the stated plan.  Therefore, we will move forward with treatment planning accordingly, in anticipation of beginning IMRT in the latter part of May 2025, once they have returned from planned family weddings/vacations.  I enjoyed meeting with him and his wife again today and look forward to continuing to participate in his care.  I personally spent 30 minutes in this encounter including chart review, reviewing radiological studies, telephone conversation with the patient, entering orders, coordinating care and completing documentation.    Marguarite Arbour, MMS, PA-C [facility] Radiation Oncology Physician Assistant Direct Dial: [phone]  Fax: [phone]

## 2023-07-05 NOTE — Telephone Encounter (Signed)
 Primary Cardiologist:Peter Swaziland, MD   Preoperative team, please contact this patient and set up a phone call appointment for further preoperative risk assessment. Please obtain consent and complete medication review. Thank you for your help. **NOTE he is due for office visit 09/2023, if he prefers, he can schedule an office visit prior to procedure scheduled for 09/22/23.   Per office protocol, patient can hold Xarelto for 2 days prior to procedure.    I also confirmed the patient resides in the state of [place]. As per [facility] Medical Board telemedicine laws, the patient must reside in the state in which the provider is licensed.   Levi Aland, NP-C  07/05/2023, 11:26 AM [address], [address] Office [phone] Fax [phone]

## 2023-07-05 NOTE — Telephone Encounter (Signed)
 S/w pt preferred in pt ov.  Scheduled with Dr. Thomasene Lot on 09/08/23.  Will route to Dr. Marlou Porch office's to [facility] and remove from per op  pool.

## 2023-07-05 NOTE — Telephone Encounter (Signed)
     Pre-operative Risk Assessment    Patient Name: Ivan Mccoy  DOB: [birth year] MRN: [id number]   Date of last office visit: 03/27/23 Date of next office visit: 09/2023 (recall)   Request for Surgical Clearance    Procedure:   Doylene Canning Seed Implant and space oar   Date of Surgery:  Clearance 09/22/23                                Surgeon:  Dr. Berniece Salines  Surgeon's Group or Practice Name:  [facility] Phone number:  [phone] Fax number:  [phone]   Type of Clearance Requested:   - Medical  - Pharmacy:  Hold Rivaroxaban (Xarelto) 2 days prior procedure   Type of Anesthesia:  MAC   Additional requests/questions:    Signed, Noe Gens   07/05/2023, 10:21 AM

## 2023-07-05 NOTE — Telephone Encounter (Signed)
 Patient with diagnosis of Afib on Xarelto for anticoagulation.    Procedure: Gold Seed Implant and space oar  Date of procedure: 09/22/23   CHA2DS2-VASc Score = 5   This indicates a 7.2% annual risk of stroke. The patient's score is based upon: CHF History: 1 HTN History: 1 Diabetes History: 0 Stroke History: 0 Vascular Disease History: 1 Age Score: 2 Gender Score: 0   CrCl 54 mL/min Platelet count 211 K   Per office protocol, patient can hold Xarelto for 2 days prior to procedure.     **This guidance is not considered finalized until pre-operative APP has relayed final recommendations.**

## 2023-07-20 NOTE — Progress Notes (Signed)
 RN reviewed with staffing to see if there was any earlier availability for fiducial's and spaceOAR.  At this time no sooner availability.

## 2023-07-26 ENCOUNTER — Other Ambulatory Visit: Payer: Self-pay | Admitting: Urology

## 2023-07-26 DIAGNOSIS — C61 Malignant neoplasm of prostate: Secondary | ICD-10-CM

## 2023-08-22 ENCOUNTER — Ambulatory Visit: Admitting: Cardiology

## 2023-08-29 ENCOUNTER — Other Ambulatory Visit: Payer: Self-pay | Admitting: Cardiology

## 2023-09-01 NOTE — Progress Notes (Unsigned)
 Cardiology Office Note   Date:  09/08/2023   ID:  Koran Seabrook, DOB [birth year], MRN [id number]  PCP:  [facility]  Cardiologist:   Piper Hassebrock [place], MD   Chief Complaint  Patient presents with   Coronary Artery Disease   Atrial Fibrillation    History of Present Illness: Ivan Mccoy is a 88 y.o. male who is seen for follow up of CAD and paroxysmal Afib. He also needs clearance for urologic procedure.  He lives at [facility]. He has a history of atrial fibrillation status post ablation in September 2013.  He also has a history of sleep apnea and is compliant with CPAP therapy. Additional past medical history is significant for hypertension and hyperlipidemia. He had a Cardiolite exercise tolerance test 05/06/2011 which was negative for ischemia. Echocardiogram also in January 2013 showed normal left ventricular systolic function with an estimated ejection fraction of 50-55%. There was moderate left ventricular hypertrophy with diastolic dysfunction. The left atrium and right atrium were mildly enlarged. Moderate tricuspid regurgitation and mild pulmonary hypertension was noted at that time.  In May 2017 he presented here with complaints of dyspnea, cough, subjective fever and chills. His CXR showed evidence of bilateral pneumonia and potentially superimposed mild interstitial edema. BNP was abnormal at 1070. Troponin was elevated at 1.15. Ecg showed T wave inversions in V5-6. He was treated for CAP. He was noted to have some episodes of Afib. Echo showed normal EF with mild basal septal hypertrophy. Mild MR and TR. Mild pulmonary HTN. He did undergo left heart cath which revealed 3 vessel obstructive CAD with predominant branch vessel disease. This includes the first and second diagonal, PLOM and RV marginal branches of the RCA. The LCx lesion appeared to be the culprit vessel and was treated w/ DES to mid distal LCx. He was DC on Plavix  and Xarelto .   On subsequent follow  up with his cardiologist in [place] he was noted to have a more significant murmur and Echo showed anterior MV prolapse with moderate MR. TEE showed severe prolapse of the A2 scallop of the anterior leaflet with severe MR. He also had severe pulmonary HTN. Event monitor in 2017 showed NSR with PACs. No Afib. He was referred to [facility] and seen by Dr Minus Amel. On 12/21/15 he had attempt at MV repair but ultimately required MV replacement with a #27 Carentier Edwards pericardial valve. Echo on November 03, 2017 showed EF 50% with moderate LVH. Moderate LAE. Normally functioning MV prosthesis. Mean gradient 3 mm Hg. Trivial MR. Mild AI and TR.   On follow up today he is doing well. Denies any chest pain or SOB. He has been diagnosed with prostate CA and is planning to start radiation therapy next month. followed by Dr Dulcy Gibney.   Past Medical History:  Diagnosis Date   Arrhythmia    paroxysmal afib   Arthritis    Cancer (HCC)    skin cancer   CHF (congestive heart failure) (HCC)    Coronary artery disease    Heart murmur    Hyperlipidemia    Hypertension    Macular degeneration    Myocardial infarction (HCC)    Obstructive sleep apnea    cpap   Pneumonia    hx of   Wet age-related macular degeneration of both eyes with inactive choroidal neovascularization (HCC)    followed by DR Sanjuan Crumbly    Past Surgical History:  Procedure Laterality Date   APPENDECTOMY  CARDIAC CATHETERIZATION N/A 08/31/2015   Procedure: Left Heart Cath and Coronary Angiography;  Surgeon: Mckaela Howley M [place], MD;  Location: [facility] INVASIVE CV LAB;  Service: Cardiovascular;  Laterality: N/A;   CARDIAC CATHETERIZATION N/A 08/31/2015   Procedure: Coronary Stent Intervention;  Surgeon: Semone Orlov M [place], MD;  Location: [facility] INVASIVE CV LAB;  Service: Cardiovascular;  Laterality: N/A;   CORONARY STENT INTERVENTION     excision of squamous cell ca Right    cheek   HEMATOMA EVACUATION     in left leg - 2012   HERNIA REPAIR      meniscus tear surgery      MITRAL VALVE REPLACEMENT  12/21/2015   #27 Carpentier Edwards pericardial valve.    PROSTATE BIOPSY     TONSILLECTOMY AND ADENOIDECTOMY     TOTAL KNEE ARTHROPLASTY Left 07/11/2022   Procedure: TOTAL KNEE ARTHROPLASTY;  Surgeon: Liliane Rei, MD;  Location: [facility] ORS;  Service: Orthopedics;  Laterality: Left;     Current Outpatient Medications  Medication Sig Dispense Refill   acetaminophen  (TYLENOL ) 500 MG tablet Take 500-1,000 mg by mouth as needed (pain.).     amLODipine  (NORVASC ) 10 MG tablet TAKE ONE (1) TABLET BY MOUTH EACH DAY 90 tablet 1   atorvastatin  (LIPITOR ) 40 MG tablet TAKE 1 TABLET BY MOUTH DAILY 90 tablet 3   carvedilol  (COREG ) 25 MG tablet TAKE ONE TABLET BY MOUTH TWICE DAILY 180 tablet 2   Cholecalciferol (VITAMIN D3) 25 MCG (1000 UT) CAPS Take 1,000 Units by mouth 3 (three) times daily.     EPINEPHRINE  0.3 mg/0.3 mL IJ SOAJ injection INJECT INTO MUSCLE ONCE FOR ALLERGIC REACTION 2 each 2   eplerenone  (INSPRA ) 50 MG tablet TAKE 1 TABLET BY MOUTH DAILY 90 tablet 3   fenofibrate  160 MG tablet TAKE ONE (1) TABLET BY MOUTH EVERY DAY 90 tablet 3   FERGON 240 (27 Fe) MG tablet Take 1 tablet by mouth every 3 (three) days.     furosemide  (LASIX ) 20 MG tablet TAKE ONE (1) TABLET BY MOUTH EVERY DAY 90 tablet 3   GLUCOSAMINE HCL PO Take 1 capsule by mouth 3 (three) times daily.     isosorbide  mononitrate (IMDUR ) 30 MG 24 hr tablet TAKE 1 TABLET BY MOUTH DAILY 90 tablet 3   metroNIDAZOLE (METROCREAM) 0.75 % cream Apply 1 application topically every morning. APPLY TO FACE     Multiple Vitamin (MULTIVITAMIN WITH MINERALS) TABS tablet Take 1 tablet by mouth in the morning. Centrum Silver     Multiple Vitamins-Minerals (PRESERVISION AREDS) TABS Take 1 tablet by mouth in the morning and at bedtime.     potassium chloride  SA (KLOR-CON  M) 20 MEQ tablet TAKE 1 TABLET BY MOUTH DAILY 90 tablet 3   Probiotic Product (PROBIOTIC DAILY) CAPS Take 1 capsule by mouth at  bedtime.     Psyllium (METAMUCIL FIBER PO) Take 1 Dose by mouth every morning. powder     rivaroxaban  (XARELTO ) 20 MG TABS tablet Take 1 tablet (20 mg total) by mouth daily with supper. 90 tablet 1   tamsulosin (FLOMAX) 0.4 MG CAPS capsule Take 0.4 mg by mouth.     valsartan  (DIOVAN ) 320 MG tablet Take 1 tablet (320 mg total) by mouth daily. 90 tablet 3   No current facility-administered medications for this visit.    Allergies:   Other, Peanut allergen powder-dnfp, Peanut butter flavoring agent (non-screening), Peanut oil, Peanut-containing drug products, Sulfa antibiotics, Eliquis [apixaban], and Spironolactone     Social History:  The patient  reports that he has never smoked. He has never used smokeless tobacco. He reports current alcohol use. He reports that he does not use drugs.   Family History:  The patient's family history includes Heart attack in his father.    ROS:  Please see the history of present illness.   Otherwise, review of systems are positive for none.   All other systems are reviewed and negative.    PHYSICAL EXAM: VS:  BP 138/82   Pulse 66   Ht 5\' 9"  (1.753 m)   Wt 185 lb 9.6 oz (84.2 kg)   SpO2 98%   BMI 27.41 kg/m  , BMI Body mass index is 27.41 kg/m. GEN: Well nourished, well developed, in no acute distress  HEENT: normal  Neck: no JVD, carotid bruits, or masses Cardiac: RRR; soft 1/6 systolic murmur LSB/apex, no rubs, or gallops,no edema  Respiratory:  clear to auscultation bilaterally, normal work of breathing GI: soft, nontender, nondistended, + BS MS: no deformity or atrophy  Skin: warm and dry, no rash Neuro:  Strength and sensation are intact Psych: euthymic mood, full affect   EKG Interpretation Date/Time:  Friday Sep 08 2023 08:57:16 EDT Ventricular Rate:  66 PR Interval:  198 QRS Duration:  128 QT Interval:  424 QTC Calculation: 444 R Axis:   -60  Text Interpretation: Normal sinus rhythm Left axis deviation Left ventricular  hypertrophy with QRS widening ( R in aVL , Cornell product ) When compared with ECG of 11-Oct-2022 15:37, No significant change was found Confirmed by [place], Marquice Uddin [id number]) on 09/08/2023 9:03:16 AM    EKG Interpretation Date/Time:  Friday Sep 08 2023 08:57:16 EDT Ventricular Rate:  66 PR Interval:  198 QRS Duration:  128 QT Interval:  424 QTC Calculation: 444 R Axis:   -60  Text Interpretation: Normal sinus rhythm Left axis deviation Left ventricular hypertrophy with QRS widening ( R in aVL , Cornell product ) When compared with ECG of 11-Oct-2022 15:37, No significant change was found Confirmed by [place], Achilles Neville [phone]) on 09/08/2023 9:03:16 AM    Recent Labs: No results found for requested labs within last 365 days.    Lipid Panel No results found for: "CHOL", "TRIG", "HDL", "CHOLHDL", "VLDL", "LDLCALC", "LDLDIRECT"   Dated 01/15/19: LDL 43, triglycerides 81, TSH normal.  Dated 05/22/20: cholesterol 93, triglycerides 86, HDL 34, LDL 42. GFR 59.  Dated 05/27/21: cholesterol 112, triglycerides 104, HDL 38, NonHDL 74.  Dated 06/26/23: cholesterol 89, triglycerides 71, HDL 36, LDL 38, A1c 5.5%. CMET and CBC OK.   Cardiac Catheterization: 08/31/2015    Dist RCA lesion, 100% stenosed. RPDA lesion, 40% stenosed. Acute Mrg lesion, 99% stenosed. Ost 1st Diag lesion, 80% stenosed. Ost 2nd Diag to 2nd Diag lesion, 90% stenosed. There is mild left ventricular systolic dysfunction. Prox Cx to Mid Cx lesion, 99% stenosed. Post intervention, there is a 0% residual stenosis.   1. 3 vessel obstructive CAD with predominant branch vessel disease. This includes the first and second diagonal, PLOM and RV marginal branches of the RCA. The LCx lesion appears to be the culprit vessel 2. Mild LV dysfunction with elevated LV EDP 3. Successful stenting of the mid-distal LCx with DES.    Plan: DAPT with ASA and Plavix  for one month then discontinue ASA. Continue Plavix  for at least one year. Plan to resume  Pradaxa  tomorrow. Patient needs additional IV diuresis. Continue beta blocker and add oral nitrate.   Echo 08/25/15:Study Conclusions   - Procedure narrative: Transthoracic echocardiography.  Image   quality was adequate. The study was technically difficult. - Left ventricle: The cavity size was normal. There was mild focal   basal hypertrophy of the septum. Systolic function was normal.   Wall motion was normal; there were no regional wall motion   abnormalities. The study is not technically sufficient to allow   evaluation of LV diastolic function. - Aortic valve: Transvalvular velocity was within the normal range.   There was no stenosis. There was mild regurgitation. - Mitral valve: Calcified annulus. There was mild regurgitation. - Left atrium: The atrium was moderately dilated. - Right ventricle: The cavity size was normal. Wall thickness was   normal. Systolic function was normal. - Tricuspid valve: There was mild regurgitation. - Pulmonary arteries: Systolic pressure was moderately increased.   PA peak pressure: 49 mm Hg (S). - Inferior vena cava: The vessel was normal in size. The   respirophasic diameter changes were blunted (< 50%), consistent   with normal central venous pressure.  Echo 01/12/21: IMPRESSIONS     1. The mitral valve has been replaced with a 27 mm Carpenteir Edwards  Pericardial Valve.   2. Prothestic valve parameters: Pressure Halte time 106 ms, TVI 3, EOA  1.4 cm2, mean gradient 3. There is evidence of some patient prosthesis  mismatch without significant mitral gradients and stenosis. There is  evidence of some degree of pathologic  regurgitation not well visualized by color Doppler. In the setting of  multiple valve disease (aortic, pulmonic, mitral), consider TEE for  further assessment, or earlier screening TTE follow up.   3. Pulmonic valve regurgitation is moderate.   4. Left ventricular ejection fraction, by estimation, is 55 to 60%. Left   ventricular ejection fraction by 3D volume is 55 %. The left ventricle has  normal function. The left ventricle has no regional wall motion  abnormalities. Left ventricular diastolic   parameters are indeterminate.   5. Right ventricular systolic function is normal. The right ventricular  size is normal. There is normal pulmonary artery systolic pressure.   6. Left atrial size was moderately dilated.   7. The aortic valve was not well visualized. Aortic valve regurgitation  is mild. No aortic stenosis is present.   Comparison(s): A prior study was performed on 08/25/2015. New mitral valve  prosthesis.   ASSESSMENT AND PLAN:  1.  CAD. S/p  NSTEMI in setting of CAP in May 2017. S/p DES of mid to distal LCx. Branch vessel disease as noted. Continue antianginal therapy with nitrates, Coreg , amlodipine . No antiplatelet therapy since on Xarelto . He is  asymptomatic.   2. Paroxysmal Afib. No recurrence documented since his valve surgery in 2017. He is at higher risk for recurrence. [place] Vasc score of 4. Continue Xarelto    3. History of MVP with severe MR. S/p MV replacement in 2017 with pericardial valve. Echo in Nov 2022 showed some MR mild to moderate.  SBE prophylaxis.   4. Hypercholesterolemia. Excellent control  5. HTN- currently well controlled on multiple meds.   6. Chronic diastolic CHF. Well compensated.   7. OSA on CPAP  8. Prostate CA Cleared from our standpoint for urologic procedure.  May hold Xarelto  for 2 days prior. SBE prophylaxis.    Current medicines are reviewed at length with the patient today.  The patient does not have concerns regarding medicines.  The following changes have been made:  no change  Labs/ tests ordered today include:   Orders Placed This Encounter  Procedures  EKG 12-Lead    Disposition:   FU with me in 6 months  Signed, Ettore Trebilcock [place], MD  09/08/2023 9:10 AM    [facility] [address], [place], [place],  [address] Phone [phone], Fax [phone]

## 2023-09-08 ENCOUNTER — Ambulatory Visit: Attending: Cardiology | Admitting: Cardiology

## 2023-09-08 ENCOUNTER — Other Ambulatory Visit: Payer: Self-pay | Admitting: Cardiology

## 2023-09-08 ENCOUNTER — Encounter: Payer: Self-pay | Admitting: Cardiology

## 2023-09-08 VITALS — BP 138/82 | HR 66 | Ht 69.0 in | Wt 185.6 lb

## 2023-09-08 DIAGNOSIS — I10 Essential (primary) hypertension: Secondary | ICD-10-CM | POA: Diagnosis present

## 2023-09-08 DIAGNOSIS — Z952 Presence of prosthetic heart valve: Secondary | ICD-10-CM

## 2023-09-08 DIAGNOSIS — I480 Paroxysmal atrial fibrillation: Secondary | ICD-10-CM

## 2023-09-08 DIAGNOSIS — E7800 Pure hypercholesterolemia, unspecified: Secondary | ICD-10-CM | POA: Diagnosis present

## 2023-09-08 DIAGNOSIS — I25118 Atherosclerotic heart disease of native coronary artery with other forms of angina pectoris: Secondary | ICD-10-CM | POA: Diagnosis present

## 2023-09-08 DIAGNOSIS — Z01818 Encounter for other preprocedural examination: Secondary | ICD-10-CM

## 2023-09-08 MED ORDER — EPINEPHRINE 0.3 MG/0.3ML IJ SOAJ: 0.3000 mg | INTRAMUSCULAR | 2 refills | Status: AC | PRN

## 2023-09-08 NOTE — Addendum Note (Signed)
 Addended by: Sherryn Donalds on: 09/08/2023 09:17 AM   Modules accepted: Orders

## 2023-09-08 NOTE — Patient Instructions (Signed)

## 2023-09-15 ENCOUNTER — Encounter (HOSPITAL_COMMUNITY): Payer: Self-pay | Admitting: Urology

## 2023-09-15 NOTE — Progress Notes (Signed)
 Spoke w/ via phone for pre-op interview--- [facility] needs dos---- BMP  per anesthesia.        Lab results------Current EKG in Epic dated 09/08/23. COVID test -----patient states asymptomatic no test needed Arrive at -------0530 NPO after MN NO Solid Food.   Pre-Surgery Ensure or G2:  Cardiac clearance dated 09/08/23 by Dr. [place]. In epic. Hold Xarelto  2 days prior to procedure.  Med rec completed Medications to take morning of surgery ----- Coreg  Diabetic medication -----  GLP1 agonist last dose: GLP1 instructions:  Patient instructed no nail polish to be worn day of surgery Patient instructed to bring photo id and insurance card day of surgery Patient aware to have Driver (ride ) / caregiver    for 24 hours after surgery - Wife Thijs Brunton Patient Special Instructions ----- Fleet enema night before surgery. Shower with antibacterial soap. Pre-Op special Instructions -----  Patient verbalized understanding of instructions that were given at this phone interview. Patient denies chest pain, sob, fever, cough at the interview.

## 2023-09-22 ENCOUNTER — Encounter (HOSPITAL_COMMUNITY)
Admission: RE | Disposition: A | Discharge status: Home / Self Care | Payer: Self-pay | Source: Home / Self Care | Attending: Urology

## 2023-09-22 ENCOUNTER — Ambulatory Visit (HOSPITAL_COMMUNITY): Admitting: Anesthesiology

## 2023-09-22 ENCOUNTER — Telehealth: Payer: Self-pay | Admitting: *Deleted

## 2023-09-22 ENCOUNTER — Encounter (HOSPITAL_COMMUNITY): Payer: Self-pay | Admitting: Urology

## 2023-09-22 ENCOUNTER — Ambulatory Visit (HOSPITAL_COMMUNITY)
Admission: RE | Admit: 2023-09-22 | Discharge: 2023-09-22 | Disposition: A | Discharge status: Home / Self Care | Attending: Urology | Admitting: Urology

## 2023-09-22 DIAGNOSIS — I252 Old myocardial infarction: Secondary | ICD-10-CM | POA: Diagnosis not present

## 2023-09-22 DIAGNOSIS — I2510 Atherosclerotic heart disease of native coronary artery without angina pectoris: Secondary | ICD-10-CM | POA: Insufficient documentation

## 2023-09-22 DIAGNOSIS — Z7901 Long term (current) use of anticoagulants: Secondary | ICD-10-CM | POA: Insufficient documentation

## 2023-09-22 DIAGNOSIS — I4891 Unspecified atrial fibrillation: Secondary | ICD-10-CM | POA: Insufficient documentation

## 2023-09-22 DIAGNOSIS — C61 Malignant neoplasm of prostate: Secondary | ICD-10-CM

## 2023-09-22 DIAGNOSIS — I10 Essential (primary) hypertension: Secondary | ICD-10-CM | POA: Diagnosis not present

## 2023-09-22 DIAGNOSIS — G4730 Sleep apnea, unspecified: Secondary | ICD-10-CM | POA: Insufficient documentation

## 2023-09-22 LAB — BASIC METABOLIC PANEL WITH GFR
Anion gap: 10 (ref 5–15)
BUN: 27 mg/dL — ABNORMAL HIGH (ref 8–23)
CO2: 21 mmol/L — ABNORMAL LOW (ref 22–32)
Calcium: 9.9 mg/dL (ref 8.9–10.3)
Chloride: 109 mmol/L (ref 98–111)
Creatinine, Ser: 1.16 mg/dL (ref 0.61–1.24)
GFR, Estimated: >60 mL/min (ref >60)
Glucose, Bld: 122 mg/dL — ABNORMAL HIGH (ref 70–99)
Potassium: 3.9 mmol/L (ref 3.5–5.1)
Sodium: 140 mmol/L (ref 135–145)

## 2023-09-22 SURGERY — INSERTION, GOLD SEEDS
Anesthesia: Monitor Anesthesia Care | Site: Prostate

## 2023-09-22 MED ORDER — LIDOCAINE HCL 1 % IJ SOLN
INTRAMUSCULAR | Status: DC | PRN
  Administered 2023-09-22: 10 mL

## 2023-09-22 MED ORDER — CHLORHEXIDINE GLUCONATE 0.12 % MT SOLN
15.0000 mL | Freq: Once | OROMUCOSAL | Status: AC
  Administered 2023-09-22: 15 mL via OROMUCOSAL

## 2023-09-22 MED ORDER — FENTANYL CITRATE (PF) 100 MCG/2ML IJ SOLN: 25.0000 ug | INTRAMUSCULAR | Status: DC | PRN

## 2023-09-22 MED ORDER — CHLORHEXIDINE GLUCONATE 0.12 % MT SOLN
OROMUCOSAL | Status: AC
  Filled 2023-09-22: qty 15

## 2023-09-22 MED ORDER — CEFAZOLIN SODIUM-DEXTROSE 2-4 GM/100ML-% IV SOLN
2.0000 g | INTRAVENOUS | Status: AC
  Administered 2023-09-22: 2 g via INTRAVENOUS

## 2023-09-22 MED ORDER — LACTATED RINGERS IV SOLN: INTRAVENOUS | Status: DC

## 2023-09-22 MED ORDER — LIDOCAINE HCL 1 % IJ SOLN
INTRAMUSCULAR | Status: AC
Start: 2023-09-22 — End: 2023-09-22
  Filled 2023-09-22: qty 100

## 2023-09-22 MED ORDER — ONDANSETRON HCL 4 MG/2ML IJ SOLN: 4.0000 mg | Freq: Once | INTRAMUSCULAR | Status: DC | PRN

## 2023-09-22 MED ORDER — CEFAZOLIN SODIUM-DEXTROSE 2-4 GM/100ML-% IV SOLN
INTRAVENOUS | Status: AC
  Filled 2023-09-22: qty 100

## 2023-09-22 MED ORDER — ORAL CARE MOUTH RINSE: 15.0000 mL | Freq: Once | OROMUCOSAL | Status: AC

## 2023-09-22 MED ORDER — SODIUM CHLORIDE (PF) 0.9 % IJ SOLN
INTRAMUSCULAR | Status: DC | PRN
  Administered 2023-09-22: 10 mL

## 2023-09-22 MED ORDER — ACETAMINOPHEN 500 MG PO TABS
1000.0000 mg | ORAL_TABLET | Freq: Once | ORAL | Status: AC
  Administered 2023-09-22: 1000 mg via ORAL

## 2023-09-22 MED ORDER — SODIUM CHLORIDE (PF) 0.9 % IJ SOLN
INTRAMUSCULAR | Status: AC
  Filled 2023-09-22: qty 50

## 2023-09-22 MED ORDER — PROPOFOL 10 MG/ML IV BOLUS
INTRAVENOUS | Status: DC | PRN
  Administered 2023-09-22: 75 ug/kg/min via INTRAVENOUS
  Administered 2023-09-22: 10 mg via INTRAVENOUS
  Administered 2023-09-22: 20 mg via INTRAVENOUS
  Administered 2023-09-22: 30 mg via INTRAVENOUS

## 2023-09-22 MED ORDER — ACETAMINOPHEN 500 MG PO TABS
ORAL_TABLET | ORAL | Status: AC
  Filled 2023-09-22: qty 2

## 2023-09-22 MED ORDER — FLEET ENEMA RE ENEM
1.0000 | ENEMA | Freq: Once | RECTAL | Status: DC
  Filled 2023-09-22: qty 1

## 2023-09-22 SURGICAL SUPPLY — 21 items
BLADE CLIPPER SENSICLIP SURGIC (BLADE) ×1 IMPLANT
CNTNR URN SCR LID CUP LEK RST (MISCELLANEOUS) ×1 IMPLANT
COVER BACK TABLE 60X90IN (DRAPES) ×1 IMPLANT
DRSG TEGADERM 4X4.75 (GAUZE/BANDAGES/DRESSINGS) ×1 IMPLANT
DRSG TEGADERM 8X12 (GAUZE/BANDAGES/DRESSINGS) ×1 IMPLANT
GAUZE SPONGE 4X4 12PLY STRL (GAUZE/BANDAGES/DRESSINGS) ×1 IMPLANT
GLOVE BIO SURGEON STRL SZ7.5 (GLOVE) ×1 IMPLANT
GLOVE SURG ORTHO 8.5 STRL (GLOVE) ×1 IMPLANT
IMPL SPACEOAR SYSTEM 10ML (Spacer) ×1 IMPLANT
MARKER GOLD PRELOAD 1.2X3 (Urological Implant) ×1 IMPLANT
MARKER SKIN DUAL TIP RULER LAB (MISCELLANEOUS) ×1 IMPLANT
NDL SPNL 22GX3.5 QUINCKE BK (NEEDLE) ×1 IMPLANT
NEEDLE SPNL 22GX3.5 QUINCKE BK (NEEDLE) ×1 IMPLANT
SHEATH ULTRASOUND LF (SHEATH) IMPLANT
SHEATH ULTRASOUND LTX NONSTRL (SHEATH) IMPLANT
SLEEVE SCD COMPRESS KNEE MED (STOCKING) ×1 IMPLANT
SURGILUBE 2OZ TUBE FLIPTOP (MISCELLANEOUS) ×1 IMPLANT
SYR 10ML LL (SYRINGE) ×1 IMPLANT
SYR CONTROL 10ML LL (SYRINGE) ×1 IMPLANT
TOWEL OR 17X24 6PK STRL BLUE (TOWEL DISPOSABLE) ×1 IMPLANT
UNDERPAD 30X36 HEAVY ABSORB (UNDERPADS AND DIAPERS) ×1 IMPLANT

## 2023-09-22 NOTE — Transfer of Care (Signed)
 Immediate Anesthesia Transfer of Care Note  Patient: Ivan Mccoy  Procedure(s) Performed: INSERTION, GOLD SEEDS (Prostate) INJECTION, HYDROGEL SPACER (Prostate)  Patient Location: PACU  Anesthesia Type:MAC  Level of Consciousness: awake, alert , oriented, and patient cooperative  Airway & Oxygen Therapy: Patient Spontanous Breathing  Post-op Assessment: Report given to RN, Post -op Vital signs reviewed and stable, Patient moving all extremities X 4, and Patient able to stick tongue midline  Post vital signs: Reviewed and stable  Last Vitals:  Vitals Value Taken Time  BP 120/68 09/22/23 08:23  Temp 97.5   Pulse 61 09/22/23 08:24  Resp 14   SpO2 96 % 09/22/23 08:24  Vitals shown include unfiled device data.  Last Pain:  Vitals:   09/22/23 0603  TempSrc: Oral  PainSc: 0-No pain      Patients Stated Pain Goal: 3 (09/22/23 0603)  Complications: No notable events documented.

## 2023-09-22 NOTE — Telephone Encounter (Signed)
 CALLED PATIENT TO REMIND OF SIM AND MRI FOR 09-26-23, SPOKE WITH PATIENT AND HE IS AWARE OF THESE APPTS. AND THE INSTRUCTIONS

## 2023-09-22 NOTE — Discharge Instructions (Addendum)
 For several days the patient:   should increase his fluid intake and limit strenuous activity. he might have mild discomfort at the base of his penis or in his rectum. he might have blood in his urine or blood in his bowel movements.   For 2-3 months he might have blood in his ejaculate (semen).   Call the office immedicately:  for blood clots in the urine or bowel movements,  difficulty urinating,  inability to urinate,  urinary retention,  painful or frequent urination,  fever, chills,  nausea, vomiting, other illness.      [facility]:  [phone]     Post Anesthesia Home Care Instructions  Activity: Get plenty of rest for the remainder of the day. A responsible individual must stay with you for 24 hours following the procedure.  For the next 24 hours, DO NOT: -Drive a car -Advertising copywriter -Drink alcoholic beverages -Take any medication unless instructed by your physician -Make any legal decisions or sign important papers.  Meals: Start with liquid foods such as gelatin or soup. Progress to regular foods as tolerated. Avoid greasy, spicy, heavy foods. If nausea and/or vomiting occur, drink only clear liquids until the nausea and/or vomiting subsides. Call your physician if vomiting continues.  Special Instructions/Symptoms: Your throat may feel dry or sore from the anesthesia or the breathing tube placed in your throat during surgery. If this causes discomfort, gargle with warm salt water . The discomfort should disappear within 24 hours.  No acetaminophen /Tylenol  until after 12:25 pm today if needed.

## 2023-09-22 NOTE — Anesthesia Preprocedure Evaluation (Addendum)
 Anesthesia Evaluation  Patient identified by MRN, date of birth, ID band Patient awake    Reviewed: Allergy & Precautions, H&P , NPO status , Patient's Chart, lab work & pertinent test results, reviewed documented beta blocker date and time   Airway Mallampati: III  TM Distance: >3 FB Neck ROM: Full    Dental  (+) Teeth Intact, Dental Advisory Given   Pulmonary sleep apnea and Continuous Positive Airway Pressure Ventilation    Pulmonary exam normal breath sounds clear to auscultation       Cardiovascular hypertension (147/62), Pt. on medications and Pt. on home beta blockers + CAD, + Past MI and + Cardiac Stents (2017)  Normal cardiovascular exam+ dysrhythmias (xarelto ) Atrial Fibrillation + Valvular Problems/Murmurs (s/p MVR 2017 with some regurg postop)  Rhythm:Regular Rate:Normal  Echo 2022  1. The mitral valve has been replaced with a 27 mm Carpenteir Edwards  Pericardial Valve.   2. Prothestic valve parameters: Pressure Halte time 106 ms, TVI 3, EOA  1.4 cm2, mean gradient 3. There is evidence of some patient prosthesis  mismatch without significant mitral gradients and stenosis. There is  evidence of some degree of pathologic  regurgitation not well visualized by color Doppler. In the setting of  multiple valve disease (aortic, pulmonic, mitral), consider TEE for  further assessment, or earlier screening TTE follow up.   3. Pulmonic valve regurgitation is moderate.   4. Left ventricular ejection fraction, by estimation, is 55 to 60%. Left  ventricular ejection fraction by 3D volume is 55 %. The left ventricle has  normal function. The left ventricle has no regional wall motion  abnormalities. Left ventricular diastolic   parameters are indeterminate.   5. Right ventricular systolic function is normal. The right ventricular  size is normal. There is normal pulmonary artery systolic pressure.   6. Left atrial size was  moderately dilated.   7. The aortic valve was not well visualized. Aortic valve regurgitation  is mild. No aortic stenosis is present.   Cath 2017 1. 3 vessel obstructive CAD with predominant branch vessel disease. This includes the first and second diagonal, PLOM and RV marginal branches of the RCA. The LCx lesion appears to be the culprit vessel 2. Mild LV dysfunction with elevated LV EDP 3. Successful stenting of the mid-distal LCx with DES.       Neuro/Psych negative neurological ROS  negative psych ROS   GI/Hepatic negative GI ROS, Neg liver ROS,,,  Endo/Other  negative endocrine ROS    Renal/GU negative Renal ROS   Prostate ca    Musculoskeletal  (+) Arthritis , Osteoarthritis,    Abdominal   Peds negative pediatric ROS (+)  Hematology negative hematology ROS (+)   Anesthesia Other Findings   Reproductive/Obstetrics negative OB ROS                             Anesthesia Physical Anesthesia Plan  ASA: 3  Anesthesia Plan: MAC   Post-op Pain Management: Tylenol  PO (pre-op)*   Induction:   PONV Risk Score and Plan: 2 and Propofol  infusion and TIVA  Airway Management Planned: Natural Airway and Simple Face Mask  Additional Equipment: None  Intra-op Plan:   Post-operative Plan:   Informed Consent: I have reviewed the patients History and Physical, chart, labs and discussed the procedure including the risks, benefits and alternatives for the proposed anesthesia with the patient or authorized representative who has indicated his/her understanding and acceptance.  Plan Discussed with: CRNA  Anesthesia Plan Comments:        Anesthesia Quick Evaluation

## 2023-09-22 NOTE — H&P (Signed)
 f/u for Prostate Cancer  HPI: Ivan Mccoy is a 88 year-old male established patient who is here for f/u while on Active Surveillance for Prostate Cancer .  The patient was last seen 8/24.   He was diagnosed with prostate cancer in approximately 05/03/2022. At the time of his prostate cancer diagnosis his PSA was 8.32. The patient's Gleason score at the time of diagnosis was 4+3=7. There were 7 positive cores on his initial biospy. The patient's most recent biopsy was approximately 05/19/2023.   The patient has had a prostate MRI. This showed: 12/22, prostate MRI-PI-RADS 4, right lateral mid zone.   His most recent PSA is 8.12. This was drawn on approximately 03/13/2023. PSA History: 6/24: 6.79, 12/23: 8.32.   Biopsy, 1/24: 2 cores 3+3= 6 (10%, 10%), 1 core 3+4 = 7 (10%, 20% Gleason 4), PI-RADS 4 lesion specimen all benign.  Biopsy, 1/25: 2 cores 4+3 equal 7, 1 core 3+4 equal 7, 3 cores 3+3 equal 6   Interval: Patient presents today for further discussion of the management of his prostate cancer. He now has grade group 3 cancer. He is here today with his wife and his daughter.      ALLERGIES: Eliquis Peanuts - Anaphylaxis    MEDICATIONS: Lipitor  40 mg tablet  Tamsulosin Hcl 0.4 mg capsule 1 capsule PO Daily  Amlodipine  Besylate 10 mg tablet  Carvedilol  25 mg tablet  Centrum Silver  Epi-Pen  Eplerenone  50 mg tablet  Fenofibrate  160 mg tablet  Glucosamine  Isosorbide  Dinitrate 30 mg tablet  Klor-Con  M20 20 meq tablet, ext release, particles/crystals  Lasix  20 mg tablet  Metrocreme  Preservision Areds  Probiotic  Valsartan  320 mg tablet  Vitamin D3  Xarelto  20 mg tablet     GU PSH: Cystoscopy - 2022 Locm 300-399Mg /Ml Iodine , - 2022 Prostate Needle Biopsy - 05/16/2023, 04/15/2022       PSH Notes: Hematoma Evacuation (2012), cyroablation (2013), meniscus repair (2013), heart stent (2017), Bovine mitral valve (2017), left knee replacement (07/2022)   NON-GU PSH: Appendectomy  (laparoscopic), 2007 (Ruptured appendix) Cataract surgery, Left, 2020 Hernia Repair, 2008 Surgical Pathology, Gross And Microscopic Examination For Prostate Needle - 05/16/2023, 04/15/2022 Tonsillectomy.., T&A- 1944 Visit Complexity (formerly GPC1X) - 03/20/2023, 09/19/2022     GU PMH: Prostate Cancer - 05/16/2023, - 03/20/2023, - 09/19/2022, - 05/24/2022 Urinary Urgency - 03/20/2023, - 09/30/2021 Elevated PSA - 04/15/2022, - 03/28/2022, - 09/30/2021, - 04/01/2021, - 2021, - 2021, - 2020 Microscopic hematuria - 2022, - 2022      PMH Notes: Wet Macular degeneration   NON-GU PMH: Arthritis Atrial Fibrillation Heart disease, unspecified Hypertension Myocardial Infarction Skin Cancer, History Sleep Apnea    FAMILY HISTORY: 2 daughters - Daughter 1 son - Son Heart Attack - Father Hypertension - Father    Notes: Father deceased at age 56- lou Gehrig's disease  Mother deceased at age 85- heart attack   SOCIAL HISTORY: Marital Status: Married Preferred Language: English; Ethnicity: Not Hispanic Or Latino; Race: White Current Smoking Status: Patient has never smoked.   Tobacco Use Assessment Completed: Used Tobacco in last 30 days? Drinks 2 drinks per week.  Does not drink caffeine.    REVIEW OF SYSTEMS:    GU Review Male:   Patient denies frequent urination, hard to postpone urination, burning/ pain with urination, get up at night to urinate, leakage of urine, stream starts and stops, trouble starting your stream, have to strain to urinate , erection problems, and penile pain.  Gastrointestinal (Upper):  Patient denies nausea, vomiting, and indigestion/ heartburn.  Gastrointestinal (Lower):   Patient denies diarrhea and constipation.  Constitutional:   Patient denies fever, night sweats, weight loss, and fatigue.  Skin:   Patient denies skin rash/ lesion and itching.  Eyes:   Patient denies blurred vision and double vision.  Ears/ Nose/ Throat:   Patient denies sore throat and sinus  problems.  Hematologic/Lymphatic:   Patient denies swollen glands and easy bruising.  Cardiovascular:   Patient denies leg swelling and chest pains.  Respiratory:   Patient denies cough and shortness of breath.  Endocrine:   Patient denies excessive thirst.  Musculoskeletal:   Patient denies back pain and joint pain.  Neurological:   Patient denies headaches and dizziness.  Psychologic:   Patient denies anxiety and depression.   VITAL SIGNS: None   Complexity of Data:  Source Of History:  Patient  Lab Test Review:   PSA  Records Review:   Pathology Reports, Previous Doctor Records, Previous Patient Records, POC Tool  Urine Test Review:   Urinalysis  Urodynamics Review:   Review Bladder Scan   03/13/23 09/12/22 03/21/22 03/21/22 09/23/21 03/25/21 08/10/20 03/23/20  PSA  Total PSA 8.12 ng/mL 6.79 ng/mL 8.32 ng/ml 8.32 ng/mL 6.41 ng/mL 5.57 ng/mL 5.62 ng/mL 5.39 ng/mL  Free PSA     1.22 ng/mL   1.10 ng/mL  % Free PSA     19 % PSA   20 % PSA    PROCEDURES:          Visit Complexity - G2211    ASSESSMENT:      ICD-10 Details  1 GU:   Prostate Cancer - C61      PLAN:           Schedule Labs: 6 Months - PSA          Document Letter(s):  Created for Patient: Clinical Summary         Notes:   The patient has had progression of his cancer, now with grade group 3 disease. He is also had increased volume. I explained this to him, and expressed my concerns. We discussed 2 options. I recommended either that we consider radiation in the next 6 months or that we follow him over time and start him on hormonal therapy if he develops metastases.   At this point the patient is considering his options, for now we will plan to check a PSA in 6 months and I will follow-up with the results. I am also going to refer him to Dr. Lorri Rota for further discussion. In addition, I am sending off a decipher test to see how aggressive this cancer appears and whether he has high risk for developing  metastatic disease in the near future.

## 2023-09-22 NOTE — Interval H&P Note (Signed)
 History and Physical Interval Note: No changes  PE Vitals:   09/15/23 1018 09/22/23 0603  BP:  (!) 147/62  Pulse:  64  Resp:  18  Temp:  (!) 97.5 F (36.4 C)  TempSrc:  Oral  SpO2:  94%  Weight: 83 kg 83.5 kg  Height: 5' 9 (1.753 m) 5' 9 (1.753 m)   RRR CTA-B  Okay to proceed.  09/22/2023 7:33 AM  Ivan Mccoy  has presented today for surgery, with the diagnosis of PROSTATE CANCER.  The various methods of treatment have been discussed with the patient and family. After consideration of risks, benefits and other options for treatment, the patient has consented to  Procedure(s) with comments: INSERTION, GOLD SEEDS (N/A) - GOLD SEED IMPLANTS AND SPACE OAR INJECTION, HYDROGEL SPACER (N/A) as a surgical intervention.  The patient's history has been reviewed, patient examined, no change in status, stable for surgery.  I have reviewed the patient's chart and labs.  Questions were answered to the patient's satisfaction.     Andrez Banker

## 2023-09-22 NOTE — Anesthesia Postprocedure Evaluation (Signed)
 Anesthesia Post Note  Patient: Ivan Mccoy  Procedure(s) Performed: INSERTION, GOLD SEEDS (Prostate) INJECTION, HYDROGEL SPACER (Prostate)     Patient location during evaluation: PACU Anesthesia Type: MAC Level of consciousness: awake and alert Pain management: pain level controlled Vital Signs Assessment: post-procedure vital signs reviewed and stable Respiratory status: spontaneous breathing, nonlabored ventilation and respiratory function stable Cardiovascular status: blood pressure returned to baseline and stable Postop Assessment: no apparent nausea or vomiting Anesthetic complications: no   No notable events documented.  Last Vitals:  Vitals:   09/22/23 0845 09/22/23 0922  BP: 137/61 (!) 161/76  Pulse: (!) 58 62  Resp: 14 19  Temp: 36.6 C (P) 36.6 C  SpO2: 96% 97%    Last Pain:  Vitals:   09/22/23 0922  TempSrc:   PainSc: 0-No pain                 Jacquelyne Matte

## 2023-09-22 NOTE — Op Note (Signed)
 Preoperative diagnosis: Clinically localized adenocarcinoma of the prostate   Postoperative diagnosis: Clinically localized adenocarcinoma of the prostate  Procedure: 1) Placement of fiducial markers into prostate                    2) Insertion of SpaceOAR hydrogel   Surgeon: Salli Crawley, M.D.  Anesthesia: General  EBL: Minimal  Complications: None  Indication: Ivan Mccoy is a 88 y.o. gentleman with clinically localized prostate cancer. After discussing management options for treatment, he elected to proceed with radiotherapy. He presents today for the above procedures. The potential risks, complications, alternative options, and expected recovery course have been discussed in detail with the patient and he has provided informed consent to proceed.  Description of procedure: The patient was administered preoperative antibiotics, placed in the dorsal lithotomy position, and prepped and draped in the usual sterile fashion. Next, transrectal ultrasonography was utilized to visualize the prostate. Three gold fiducial markers were then placed into the prostate via transperineal needles under ultrasound guidance at the right apex, right base, and right mid gland under direct ultrasound guidance. A site in the midline was then selected on the perineum for placement of an 18 g needle with saline. The needle was advanced above the rectum and below Denonvillier's fascia to the mid gland and confirmed to be in the midline on transverse imaging. One cc of saline was injected confirming appropriate expansion of this space. A total of 5 cc of saline was then injected to open the space further bilaterally. The saline syringe was then removed and the SpaceOAR hydrogel was injected with good distribution bilaterally. He tolerated the procedure well and without complications. He was given a voiding trial prior to discharge from the PACU.

## 2023-09-25 ENCOUNTER — Encounter (HOSPITAL_COMMUNITY): Payer: Self-pay | Admitting: Urology

## 2023-09-26 ENCOUNTER — Ambulatory Visit (HOSPITAL_COMMUNITY)
Admission: RE | Admit: 2023-09-26 | Discharge: 2023-09-26 | Disposition: A | Discharge status: Home / Self Care | Source: Ambulatory Visit | Attending: Urology | Admitting: Urology

## 2023-09-26 ENCOUNTER — Ambulatory Visit
Admission: RE | Admit: 2023-09-26 | Discharge: 2023-09-26 | Disposition: A | Discharge status: Home / Self Care | Source: Ambulatory Visit | Attending: Urology | Admitting: Urology

## 2023-09-26 DIAGNOSIS — Z510 Encounter for antineoplastic radiation therapy: Secondary | ICD-10-CM | POA: Diagnosis present

## 2023-09-26 DIAGNOSIS — C61 Malignant neoplasm of prostate: Secondary | ICD-10-CM | POA: Diagnosis present

## 2023-09-26 NOTE — Progress Notes (Signed)
  Radiation Oncology         [phone] ________________________________  Name: Tierra Divelbiss MRN: [id number]  Date: 09/26/2023  DOB: [birth year]  SIMULATION AND TREATMENT PLANNING NOTE    ICD-10-CM   1. Malignant neoplasm of prostate (HCC)  C61       DIAGNOSIS:  88 y.o. gentleman with Stage T1c adenocarcinoma of the prostate with Gleason score of 4+3, and PSA of 8.12.  NARRATIVE:  The patient was brought to the CT Simulation planning suite.  Identity was confirmed.  All relevant records and images related to the planned course of therapy were reviewed.  The patient freely provided informed written consent to proceed with treatment after reviewing the details related to the planned course of therapy. The consent form was witnessed and verified by the simulation staff.  Then, the patient was set-up in a stable reproducible supine position for radiation therapy.  A vacuum lock pillow device was custom fabricated to position his legs in a reproducible immobilized position.  Then, supervised the performance of a urethrogram under sterile conditions to identify the prostatic apex.  CT images were obtained.  Surface markings were placed.  The CT images were loaded into the planning software.  Then the prostate target and avoidance structures including the rectum, bladder, bowel and hips were contoured.  Treatment planning then occurred.  The radiation prescription was entered and confirmed.  A total of one complex treatment devices was fabricated. I have requested : Intensity Modulated Radiotherapy (IMRT) is medically necessary for this case for the following reason:  Rectal sparing.  I have requested daily cone beam CT volumetric image gudiance to track gold fiducial posiitoning along with bladder and rectal filling, this is medically necessary to assure accurate positioning of high dose radiation.  PLAN:  The patient will receive 70 Gy in 28 fractions.  ________________________________  Trilby Fujisawa  Lorri Rota, M.D.

## 2023-09-27 ENCOUNTER — Other Ambulatory Visit: Payer: Self-pay | Admitting: Cardiology

## 2023-09-27 NOTE — Telephone Encounter (Signed)
 Prescription refill request for Xarelto  received.  Indication:afib Last office visit:5/25 Weight:83.5  kg Age:88 Scr:1.16  6/25 CrCl:54.99  ml/min  Prescription refilled

## 2023-09-28 DIAGNOSIS — Z510 Encounter for antineoplastic radiation therapy: Secondary | ICD-10-CM | POA: Diagnosis not present

## 2023-10-05 ENCOUNTER — Ambulatory Visit
Admission: RE | Admit: 2023-10-05 | Discharge: 2023-10-05 | Disposition: A | Discharge status: Home / Self Care | Source: Ambulatory Visit | Attending: Radiation Oncology | Admitting: Radiation Oncology

## 2023-10-05 ENCOUNTER — Other Ambulatory Visit: Payer: Self-pay

## 2023-10-05 DIAGNOSIS — Z510 Encounter for antineoplastic radiation therapy: Secondary | ICD-10-CM | POA: Diagnosis not present

## 2023-10-05 LAB — RAD ONC ARIA SESSION SUMMARY
Course Elapsed Days: 0
Plan Fractions Treated to Date: 1
Plan Prescribed Dose Per Fraction: 2.5 Gy
Plan Total Fractions Prescribed: 28
Plan Total Prescribed Dose: 70 Gy
Reference Point Dosage Given to Date: 2.5 Gy
Reference Point Session Dosage Given: 2.5 Gy
Session Number: 1

## 2023-10-06 ENCOUNTER — Other Ambulatory Visit: Payer: Self-pay

## 2023-10-06 ENCOUNTER — Ambulatory Visit
Admission: RE | Admit: 2023-10-06 | Discharge: 2023-10-06 | Disposition: A | Discharge status: Home / Self Care | Source: Ambulatory Visit | Attending: Radiation Oncology | Admitting: Radiation Oncology

## 2023-10-06 ENCOUNTER — Ambulatory Visit
Admission: RE | Admit: 2023-10-06 | Discharge: 2023-10-06 | Disposition: A | Discharge status: Home / Self Care | Source: Ambulatory Visit | Attending: Radiation Oncology

## 2023-10-06 DIAGNOSIS — Z510 Encounter for antineoplastic radiation therapy: Secondary | ICD-10-CM | POA: Diagnosis not present

## 2023-10-06 LAB — RAD ONC ARIA SESSION SUMMARY
Course Elapsed Days: 1
Plan Fractions Treated to Date: 2
Plan Prescribed Dose Per Fraction: 2.5 Gy
Plan Total Fractions Prescribed: 28
Plan Total Prescribed Dose: 70 Gy
Reference Point Dosage Given to Date: 5 Gy
Reference Point Session Dosage Given: 2.5 Gy
Session Number: 2

## 2023-10-09 ENCOUNTER — Other Ambulatory Visit: Payer: Self-pay

## 2023-10-09 ENCOUNTER — Ambulatory Visit
Admission: RE | Admit: 2023-10-09 | Discharge: 2023-10-09 | Disposition: A | Discharge status: Home / Self Care | Source: Ambulatory Visit | Attending: Radiation Oncology

## 2023-10-09 DIAGNOSIS — Z510 Encounter for antineoplastic radiation therapy: Secondary | ICD-10-CM | POA: Diagnosis not present

## 2023-10-09 LAB — RAD ONC ARIA SESSION SUMMARY
Course Elapsed Days: 4
Plan Fractions Treated to Date: 3
Plan Prescribed Dose Per Fraction: 2.5 Gy
Plan Total Fractions Prescribed: 28
Plan Total Prescribed Dose: 70 Gy
Reference Point Dosage Given to Date: 7.5 Gy
Reference Point Session Dosage Given: 2.5 Gy
Session Number: 3

## 2023-10-10 ENCOUNTER — Other Ambulatory Visit: Payer: Self-pay

## 2023-10-10 ENCOUNTER — Ambulatory Visit
Admission: RE | Admit: 2023-10-10 | Discharge: 2023-10-10 | Disposition: A | Discharge status: Home / Self Care | Source: Ambulatory Visit | Attending: Radiation Oncology | Admitting: Radiation Oncology

## 2023-10-10 DIAGNOSIS — C61 Malignant neoplasm of prostate: Secondary | ICD-10-CM | POA: Diagnosis present

## 2023-10-10 DIAGNOSIS — Z510 Encounter for antineoplastic radiation therapy: Secondary | ICD-10-CM | POA: Diagnosis present

## 2023-10-10 LAB — RAD ONC ARIA SESSION SUMMARY
Course Elapsed Days: 5
Plan Fractions Treated to Date: 4
Plan Prescribed Dose Per Fraction: 2.5 Gy
Plan Total Fractions Prescribed: 28
Plan Total Prescribed Dose: 70 Gy
Reference Point Dosage Given to Date: 10 Gy
Reference Point Session Dosage Given: 2.5 Gy
Session Number: 4

## 2023-10-11 ENCOUNTER — Ambulatory Visit
Admission: RE | Admit: 2023-10-11 | Discharge: 2023-10-11 | Disposition: A | Discharge status: Home / Self Care | Source: Ambulatory Visit | Attending: Radiation Oncology

## 2023-10-11 ENCOUNTER — Other Ambulatory Visit: Payer: Self-pay

## 2023-10-11 DIAGNOSIS — Z510 Encounter for antineoplastic radiation therapy: Secondary | ICD-10-CM | POA: Diagnosis not present

## 2023-10-11 LAB — RAD ONC ARIA SESSION SUMMARY
Course Elapsed Days: 6
Plan Fractions Treated to Date: 5
Plan Prescribed Dose Per Fraction: 2.5 Gy
Plan Total Fractions Prescribed: 28
Plan Total Prescribed Dose: 70 Gy
Reference Point Dosage Given to Date: 12.5 Gy
Reference Point Session Dosage Given: 2.5 Gy
Session Number: 5

## 2023-10-12 ENCOUNTER — Ambulatory Visit
Admission: RE | Admit: 2023-10-12 | Discharge: 2023-10-12 | Disposition: A | Discharge status: Home / Self Care | Source: Ambulatory Visit | Attending: Radiation Oncology | Admitting: Radiation Oncology

## 2023-10-12 ENCOUNTER — Other Ambulatory Visit: Payer: Self-pay

## 2023-10-12 DIAGNOSIS — Z510 Encounter for antineoplastic radiation therapy: Secondary | ICD-10-CM | POA: Diagnosis not present

## 2023-10-12 LAB — RAD ONC ARIA SESSION SUMMARY
Course Elapsed Days: 7
Plan Fractions Treated to Date: 6
Plan Prescribed Dose Per Fraction: 2.5 Gy
Plan Total Fractions Prescribed: 28
Plan Total Prescribed Dose: 70 Gy
Reference Point Dosage Given to Date: 15 Gy
Reference Point Session Dosage Given: 2.5 Gy
Session Number: 6

## 2023-10-16 ENCOUNTER — Other Ambulatory Visit: Payer: Self-pay

## 2023-10-16 ENCOUNTER — Ambulatory Visit
Admission: RE | Admit: 2023-10-16 | Discharge: 2023-10-16 | Disposition: A | Discharge status: Home / Self Care | Source: Ambulatory Visit | Attending: Radiation Oncology | Admitting: Radiation Oncology

## 2023-10-16 DIAGNOSIS — Z510 Encounter for antineoplastic radiation therapy: Secondary | ICD-10-CM | POA: Diagnosis not present

## 2023-10-16 LAB — RAD ONC ARIA SESSION SUMMARY
Course Elapsed Days: 11
Plan Fractions Treated to Date: 7
Plan Prescribed Dose Per Fraction: 2.5 Gy
Plan Total Fractions Prescribed: 28
Plan Total Prescribed Dose: 70 Gy
Reference Point Dosage Given to Date: 17.5 Gy
Reference Point Session Dosage Given: 2.5 Gy
Session Number: 7

## 2023-10-17 ENCOUNTER — Other Ambulatory Visit: Payer: Self-pay

## 2023-10-17 ENCOUNTER — Ambulatory Visit
Admission: RE | Admit: 2023-10-17 | Discharge: 2023-10-17 | Disposition: A | Discharge status: Home / Self Care | Source: Ambulatory Visit | Attending: Radiation Oncology | Admitting: Radiation Oncology

## 2023-10-17 DIAGNOSIS — Z510 Encounter for antineoplastic radiation therapy: Secondary | ICD-10-CM | POA: Diagnosis not present

## 2023-10-17 LAB — RAD ONC ARIA SESSION SUMMARY
Course Elapsed Days: 12
Plan Fractions Treated to Date: 8
Plan Prescribed Dose Per Fraction: 2.5 Gy
Plan Total Fractions Prescribed: 28
Plan Total Prescribed Dose: 70 Gy
Reference Point Dosage Given to Date: 20 Gy
Reference Point Session Dosage Given: 2.5 Gy
Session Number: 8

## 2023-10-18 ENCOUNTER — Other Ambulatory Visit: Payer: Self-pay

## 2023-10-18 ENCOUNTER — Ambulatory Visit
Admission: RE | Admit: 2023-10-18 | Discharge: 2023-10-18 | Disposition: A | Discharge status: Home / Self Care | Source: Ambulatory Visit | Attending: Radiation Oncology

## 2023-10-18 DIAGNOSIS — Z510 Encounter for antineoplastic radiation therapy: Secondary | ICD-10-CM | POA: Diagnosis not present

## 2023-10-18 LAB — RAD ONC ARIA SESSION SUMMARY
Course Elapsed Days: 13
Plan Fractions Treated to Date: 9
Plan Prescribed Dose Per Fraction: 2.5 Gy
Plan Total Fractions Prescribed: 28
Plan Total Prescribed Dose: 70 Gy
Reference Point Dosage Given to Date: 22.5 Gy
Reference Point Session Dosage Given: 2.5 Gy
Session Number: 9

## 2023-10-19 ENCOUNTER — Other Ambulatory Visit: Payer: Self-pay

## 2023-10-19 ENCOUNTER — Ambulatory Visit
Admission: RE | Admit: 2023-10-19 | Discharge: 2023-10-19 | Disposition: A | Discharge status: Home / Self Care | Source: Ambulatory Visit | Attending: Radiation Oncology | Admitting: Radiation Oncology

## 2023-10-19 DIAGNOSIS — Z510 Encounter for antineoplastic radiation therapy: Secondary | ICD-10-CM | POA: Diagnosis not present

## 2023-10-19 LAB — RAD ONC ARIA SESSION SUMMARY
Course Elapsed Days: 14
Plan Fractions Treated to Date: 10
Plan Prescribed Dose Per Fraction: 2.5 Gy
Plan Total Fractions Prescribed: 28
Plan Total Prescribed Dose: 70 Gy
Reference Point Dosage Given to Date: 25 Gy
Reference Point Session Dosage Given: 2.5 Gy
Session Number: 10

## 2023-10-20 ENCOUNTER — Ambulatory Visit
Admission: RE | Admit: 2023-10-20 | Discharge: 2023-10-20 | Disposition: A | Discharge status: Home / Self Care | Source: Ambulatory Visit | Attending: Radiation Oncology | Admitting: Radiation Oncology

## 2023-10-20 ENCOUNTER — Other Ambulatory Visit: Payer: Self-pay

## 2023-10-20 DIAGNOSIS — Z510 Encounter for antineoplastic radiation therapy: Secondary | ICD-10-CM | POA: Diagnosis not present

## 2023-10-20 LAB — RAD ONC ARIA SESSION SUMMARY
Course Elapsed Days: 15
Plan Fractions Treated to Date: 11
Plan Prescribed Dose Per Fraction: 2.5 Gy
Plan Total Fractions Prescribed: 28
Plan Total Prescribed Dose: 70 Gy
Reference Point Dosage Given to Date: 27.5 Gy
Reference Point Session Dosage Given: 2.5 Gy
Session Number: 11

## 2023-10-23 ENCOUNTER — Other Ambulatory Visit: Payer: Self-pay

## 2023-10-23 ENCOUNTER — Ambulatory Visit
Admission: RE | Admit: 2023-10-23 | Discharge: 2023-10-23 | Disposition: A | Discharge status: Home / Self Care | Source: Ambulatory Visit | Attending: Radiation Oncology | Admitting: Radiation Oncology

## 2023-10-23 DIAGNOSIS — Z510 Encounter for antineoplastic radiation therapy: Secondary | ICD-10-CM | POA: Diagnosis not present

## 2023-10-23 LAB — RAD ONC ARIA SESSION SUMMARY
Course Elapsed Days: 18
Plan Fractions Treated to Date: 12
Plan Prescribed Dose Per Fraction: 2.5 Gy
Plan Total Fractions Prescribed: 28
Plan Total Prescribed Dose: 70 Gy
Reference Point Dosage Given to Date: 30 Gy
Reference Point Session Dosage Given: 2.5 Gy
Session Number: 12

## 2023-10-24 ENCOUNTER — Ambulatory Visit
Admission: RE | Admit: 2023-10-24 | Discharge: 2023-10-24 | Disposition: A | Discharge status: Home / Self Care | Source: Ambulatory Visit | Attending: Radiation Oncology | Admitting: Radiation Oncology

## 2023-10-24 ENCOUNTER — Other Ambulatory Visit: Payer: Self-pay

## 2023-10-24 DIAGNOSIS — Z510 Encounter for antineoplastic radiation therapy: Secondary | ICD-10-CM | POA: Diagnosis not present

## 2023-10-24 LAB — RAD ONC ARIA SESSION SUMMARY
Course Elapsed Days: 19
Plan Fractions Treated to Date: 13
Plan Prescribed Dose Per Fraction: 2.5 Gy
Plan Total Fractions Prescribed: 28
Plan Total Prescribed Dose: 70 Gy
Reference Point Dosage Given to Date: 32.5 Gy
Reference Point Session Dosage Given: 2.5 Gy
Session Number: 13

## 2023-10-25 ENCOUNTER — Ambulatory Visit
Admission: RE | Admit: 2023-10-25 | Discharge: 2023-10-25 | Disposition: A | Discharge status: Home / Self Care | Source: Ambulatory Visit | Attending: Radiation Oncology | Admitting: Radiation Oncology

## 2023-10-25 ENCOUNTER — Other Ambulatory Visit: Payer: Self-pay

## 2023-10-25 DIAGNOSIS — Z510 Encounter for antineoplastic radiation therapy: Secondary | ICD-10-CM | POA: Diagnosis not present

## 2023-10-25 LAB — RAD ONC ARIA SESSION SUMMARY
Course Elapsed Days: 20
Plan Fractions Treated to Date: 14
Plan Prescribed Dose Per Fraction: 2.5 Gy
Plan Total Fractions Prescribed: 28
Plan Total Prescribed Dose: 70 Gy
Reference Point Dosage Given to Date: 35 Gy
Reference Point Session Dosage Given: 2.5 Gy
Session Number: 14

## 2023-10-26 ENCOUNTER — Ambulatory Visit
Admission: RE | Admit: 2023-10-26 | Discharge: 2023-10-26 | Discharge status: Home / Self Care | Source: Ambulatory Visit | Attending: Radiation Oncology

## 2023-10-26 ENCOUNTER — Other Ambulatory Visit: Payer: Self-pay

## 2023-10-26 ENCOUNTER — Ambulatory Visit
Admission: RE | Admit: 2023-10-26 | Discharge: 2023-10-26 | Disposition: A | Discharge status: Home / Self Care | Source: Ambulatory Visit | Attending: Radiation Oncology | Admitting: Radiation Oncology

## 2023-10-26 DIAGNOSIS — Z510 Encounter for antineoplastic radiation therapy: Secondary | ICD-10-CM | POA: Diagnosis not present

## 2023-10-26 LAB — RAD ONC ARIA SESSION SUMMARY
Course Elapsed Days: 21
Plan Fractions Treated to Date: 15
Plan Prescribed Dose Per Fraction: 2.5 Gy
Plan Total Fractions Prescribed: 28
Plan Total Prescribed Dose: 70 Gy
Reference Point Dosage Given to Date: 37.5 Gy
Reference Point Session Dosage Given: 2.5 Gy
Session Number: 15

## 2023-10-27 ENCOUNTER — Other Ambulatory Visit: Payer: Self-pay

## 2023-10-27 ENCOUNTER — Ambulatory Visit
Admission: RE | Admit: 2023-10-27 | Discharge: 2023-10-27 | Disposition: A | Discharge status: Home / Self Care | Source: Ambulatory Visit | Attending: Radiation Oncology | Admitting: Radiation Oncology

## 2023-10-27 DIAGNOSIS — Z510 Encounter for antineoplastic radiation therapy: Secondary | ICD-10-CM | POA: Diagnosis not present

## 2023-10-27 LAB — RAD ONC ARIA SESSION SUMMARY
Course Elapsed Days: 22
Plan Fractions Treated to Date: 16
Plan Prescribed Dose Per Fraction: 2.5 Gy
Plan Total Fractions Prescribed: 28
Plan Total Prescribed Dose: 70 Gy
Reference Point Dosage Given to Date: 40 Gy
Reference Point Session Dosage Given: 2.5 Gy
Session Number: 16

## 2023-10-30 ENCOUNTER — Ambulatory Visit
Admission: RE | Admit: 2023-10-30 | Discharge: 2023-10-30 | Disposition: A | Discharge status: Home / Self Care | Source: Ambulatory Visit | Attending: Radiation Oncology

## 2023-10-30 ENCOUNTER — Other Ambulatory Visit: Payer: Self-pay

## 2023-10-30 DIAGNOSIS — Z510 Encounter for antineoplastic radiation therapy: Secondary | ICD-10-CM | POA: Diagnosis not present

## 2023-10-30 LAB — RAD ONC ARIA SESSION SUMMARY
Course Elapsed Days: 25
Plan Fractions Treated to Date: 17
Plan Prescribed Dose Per Fraction: 2.5 Gy
Plan Total Fractions Prescribed: 28
Plan Total Prescribed Dose: 70 Gy
Reference Point Dosage Given to Date: 42.5 Gy
Reference Point Session Dosage Given: 2.5 Gy
Session Number: 17

## 2023-10-31 ENCOUNTER — Ambulatory Visit
Admission: RE | Admit: 2023-10-31 | Discharge: 2023-10-31 | Disposition: A | Discharge status: Home / Self Care | Source: Ambulatory Visit | Attending: Radiation Oncology | Admitting: Radiation Oncology

## 2023-10-31 ENCOUNTER — Other Ambulatory Visit: Payer: Self-pay

## 2023-10-31 DIAGNOSIS — Z510 Encounter for antineoplastic radiation therapy: Secondary | ICD-10-CM | POA: Diagnosis not present

## 2023-10-31 LAB — RAD ONC ARIA SESSION SUMMARY
Course Elapsed Days: 26
Plan Fractions Treated to Date: 18
Plan Prescribed Dose Per Fraction: 2.5 Gy
Plan Total Fractions Prescribed: 28
Plan Total Prescribed Dose: 70 Gy
Reference Point Dosage Given to Date: 45 Gy
Reference Point Session Dosage Given: 2.5 Gy
Session Number: 18

## 2023-11-01 ENCOUNTER — Ambulatory Visit

## 2023-11-02 ENCOUNTER — Other Ambulatory Visit: Payer: Self-pay

## 2023-11-02 ENCOUNTER — Ambulatory Visit
Admission: RE | Admit: 2023-11-02 | Discharge: 2023-11-02 | Disposition: A | Discharge status: Home / Self Care | Source: Ambulatory Visit | Attending: Radiation Oncology | Admitting: Radiation Oncology

## 2023-11-02 ENCOUNTER — Ambulatory Visit
Admission: RE | Admit: 2023-11-02 | Discharge: 2023-11-02 | Disposition: A | Discharge status: Home / Self Care | Source: Ambulatory Visit | Attending: Radiation Oncology

## 2023-11-02 DIAGNOSIS — Z510 Encounter for antineoplastic radiation therapy: Secondary | ICD-10-CM | POA: Diagnosis not present

## 2023-11-02 LAB — RAD ONC ARIA SESSION SUMMARY
Course Elapsed Days: 28
Plan Fractions Treated to Date: 19
Plan Prescribed Dose Per Fraction: 2.5 Gy
Plan Total Fractions Prescribed: 28
Plan Total Prescribed Dose: 70 Gy
Reference Point Dosage Given to Date: 47.5 Gy
Reference Point Session Dosage Given: 2.5 Gy
Session Number: 19

## 2023-11-03 ENCOUNTER — Ambulatory Visit
Admission: RE | Admit: 2023-11-03 | Discharge: 2023-11-03 | Disposition: A | Discharge status: Home / Self Care | Source: Ambulatory Visit | Attending: Radiation Oncology | Admitting: Radiation Oncology

## 2023-11-03 ENCOUNTER — Other Ambulatory Visit: Payer: Self-pay

## 2023-11-03 DIAGNOSIS — Z510 Encounter for antineoplastic radiation therapy: Secondary | ICD-10-CM | POA: Diagnosis not present

## 2023-11-03 LAB — RAD ONC ARIA SESSION SUMMARY
Course Elapsed Days: 29
Plan Fractions Treated to Date: 20
Plan Prescribed Dose Per Fraction: 2.5 Gy
Plan Total Fractions Prescribed: 28
Plan Total Prescribed Dose: 70 Gy
Reference Point Dosage Given to Date: 50 Gy
Reference Point Session Dosage Given: 2.5 Gy
Session Number: 20

## 2023-11-06 ENCOUNTER — Other Ambulatory Visit: Payer: Self-pay

## 2023-11-06 ENCOUNTER — Ambulatory Visit
Admission: RE | Admit: 2023-11-06 | Discharge: 2023-11-06 | Disposition: A | Discharge status: Home / Self Care | Source: Ambulatory Visit | Attending: Radiation Oncology | Admitting: Radiation Oncology

## 2023-11-06 DIAGNOSIS — Z510 Encounter for antineoplastic radiation therapy: Secondary | ICD-10-CM | POA: Diagnosis not present

## 2023-11-06 LAB — RAD ONC ARIA SESSION SUMMARY
Course Elapsed Days: 32
Plan Fractions Treated to Date: 21
Plan Prescribed Dose Per Fraction: 2.5 Gy
Plan Total Fractions Prescribed: 28
Plan Total Prescribed Dose: 70 Gy
Reference Point Dosage Given to Date: 52.5 Gy
Reference Point Session Dosage Given: 2.5 Gy
Session Number: 21

## 2023-11-07 ENCOUNTER — Other Ambulatory Visit: Payer: Self-pay

## 2023-11-07 ENCOUNTER — Ambulatory Visit
Admission: RE | Admit: 2023-11-07 | Discharge: 2023-11-07 | Disposition: A | Discharge status: Home / Self Care | Source: Ambulatory Visit | Attending: Radiation Oncology

## 2023-11-07 DIAGNOSIS — Z510 Encounter for antineoplastic radiation therapy: Secondary | ICD-10-CM | POA: Diagnosis not present

## 2023-11-07 LAB — RAD ONC ARIA SESSION SUMMARY
Course Elapsed Days: 33
Plan Fractions Treated to Date: 22
Plan Prescribed Dose Per Fraction: 2.5 Gy
Plan Total Fractions Prescribed: 28
Plan Total Prescribed Dose: 70 Gy
Reference Point Dosage Given to Date: 55 Gy
Reference Point Session Dosage Given: 2.5 Gy
Session Number: 22

## 2023-11-08 ENCOUNTER — Ambulatory Visit
Admission: RE | Admit: 2023-11-08 | Discharge: 2023-11-08 | Disposition: A | Discharge status: Home / Self Care | Source: Ambulatory Visit | Attending: Radiation Oncology

## 2023-11-08 ENCOUNTER — Other Ambulatory Visit: Payer: Self-pay

## 2023-11-08 DIAGNOSIS — Z510 Encounter for antineoplastic radiation therapy: Secondary | ICD-10-CM | POA: Diagnosis not present

## 2023-11-08 LAB — RAD ONC ARIA SESSION SUMMARY
Course Elapsed Days: 34
Plan Fractions Treated to Date: 23
Plan Prescribed Dose Per Fraction: 2.5 Gy
Plan Total Fractions Prescribed: 28
Plan Total Prescribed Dose: 70 Gy
Reference Point Dosage Given to Date: 57.5 Gy
Reference Point Session Dosage Given: 2.5 Gy
Session Number: 23

## 2023-11-09 ENCOUNTER — Other Ambulatory Visit: Payer: Self-pay

## 2023-11-09 ENCOUNTER — Ambulatory Visit
Admission: RE | Admit: 2023-11-09 | Discharge: 2023-11-09 | Disposition: A | Discharge status: Home / Self Care | Source: Ambulatory Visit | Attending: Radiation Oncology | Admitting: Radiation Oncology

## 2023-11-09 DIAGNOSIS — Z510 Encounter for antineoplastic radiation therapy: Secondary | ICD-10-CM | POA: Diagnosis not present

## 2023-11-09 LAB — RAD ONC ARIA SESSION SUMMARY
Course Elapsed Days: 35
Plan Fractions Treated to Date: 24
Plan Prescribed Dose Per Fraction: 2.5 Gy
Plan Total Fractions Prescribed: 28
Plan Total Prescribed Dose: 70 Gy
Reference Point Dosage Given to Date: 60 Gy
Reference Point Session Dosage Given: 2.5 Gy
Session Number: 24

## 2023-11-10 ENCOUNTER — Ambulatory Visit

## 2023-11-10 ENCOUNTER — Other Ambulatory Visit: Payer: Self-pay | Admitting: Cardiology

## 2023-11-13 ENCOUNTER — Ambulatory Visit
Admission: RE | Admit: 2023-11-13 | Discharge: 2023-11-13 | Disposition: A | Discharge status: Home / Self Care | Source: Ambulatory Visit | Attending: Radiation Oncology | Admitting: Radiation Oncology

## 2023-11-13 DIAGNOSIS — C61 Malignant neoplasm of prostate: Secondary | ICD-10-CM | POA: Insufficient documentation

## 2023-11-14 ENCOUNTER — Ambulatory Visit
Admission: RE | Admit: 2023-11-14 | Discharge: 2023-11-14 | Disposition: A | Discharge status: Home / Self Care | Source: Ambulatory Visit | Attending: Radiation Oncology | Admitting: Radiation Oncology

## 2023-11-14 ENCOUNTER — Ambulatory Visit

## 2023-11-14 ENCOUNTER — Ambulatory Visit
Admission: RE | Admit: 2023-11-14 | Discharge: 2023-11-14 | Disposition: A | Discharge status: Home / Self Care | Source: Ambulatory Visit | Attending: Radiation Oncology

## 2023-11-14 ENCOUNTER — Other Ambulatory Visit: Payer: Self-pay

## 2023-11-14 DIAGNOSIS — C61 Malignant neoplasm of prostate: Secondary | ICD-10-CM | POA: Diagnosis not present

## 2023-11-14 LAB — RAD ONC ARIA SESSION SUMMARY
Course Elapsed Days: 40
Plan Fractions Treated to Date: 26
Plan Prescribed Dose Per Fraction: 2.5 Gy
Plan Total Fractions Prescribed: 28
Plan Total Prescribed Dose: 70 Gy
Reference Point Dosage Given to Date: 65 Gy
Reference Point Session Dosage Given: 2.5 Gy
Session Number: 26

## 2023-11-15 ENCOUNTER — Ambulatory Visit

## 2023-11-15 ENCOUNTER — Ambulatory Visit
Admission: RE | Admit: 2023-11-15 | Discharge: 2023-11-15 | Disposition: A | Discharge status: Home / Self Care | Source: Ambulatory Visit | Attending: Radiation Oncology

## 2023-11-15 ENCOUNTER — Other Ambulatory Visit: Payer: Self-pay

## 2023-11-15 DIAGNOSIS — C61 Malignant neoplasm of prostate: Secondary | ICD-10-CM | POA: Diagnosis not present

## 2023-11-15 LAB — RAD ONC ARIA SESSION SUMMARY
Course Elapsed Days: 41
Plan Fractions Treated to Date: 27
Plan Prescribed Dose Per Fraction: 2.5 Gy
Plan Total Fractions Prescribed: 28
Plan Total Prescribed Dose: 70 Gy
Reference Point Dosage Given to Date: 67.5 Gy
Reference Point Session Dosage Given: 2.5 Gy
Session Number: 27

## 2023-11-16 ENCOUNTER — Ambulatory Visit

## 2023-11-17 ENCOUNTER — Ambulatory Visit
Admission: RE | Admit: 2023-11-17 | Discharge: 2023-11-17 | Disposition: A | Discharge status: Home / Self Care | Source: Ambulatory Visit | Attending: Radiation Oncology | Admitting: Radiation Oncology

## 2023-11-17 ENCOUNTER — Ambulatory Visit

## 2023-11-17 ENCOUNTER — Other Ambulatory Visit: Payer: Self-pay

## 2023-11-17 DIAGNOSIS — C61 Malignant neoplasm of prostate: Secondary | ICD-10-CM | POA: Diagnosis not present

## 2023-11-17 LAB — RAD ONC ARIA SESSION SUMMARY
Course Elapsed Days: 43
Plan Fractions Treated to Date: 28
Plan Prescribed Dose Per Fraction: 2.5 Gy
Plan Total Fractions Prescribed: 28
Plan Total Prescribed Dose: 70 Gy
Reference Point Dosage Given to Date: 70 Gy
Reference Point Session Dosage Given: 2.5 Gy
Session Number: 28

## 2023-11-17 NOTE — Progress Notes (Signed)
 Patient was a RadOnc Consult on 06/02/23 for his stage T1c adenocarcinoma of the prostate with Gleason score of 4+3, and PSA of 8.12.   Patient proceed with treatment recommendations of 5.5 weeks of external beam therapy and had his final radiation treatment on 11/17/23.   Patient is scheduled for a post treatment nurse call on 12/19/23 and has active ongoing urology follow up's, next appointment 8/22.

## 2023-11-20 NOTE — Radiation Completion Notes (Signed)
  Radiation Oncology         [phone] ________________________________  Name: Ivan Mccoy MRN: [id number]  Date: 11/17/2023  DOB: March 05, 1936  Referring Physician: BENJAMIN HERRICK, M.D. Date of Service: 2023-11-20 Radiation Oncologist: Adina Barge, M.D. [facility]     RADIATION ONCOLOGY END OF TREATMENT NOTE     Diagnosis:  88 y.o. gentleman with Stage T1c adenocarcinoma of the prostate with Gleason score of 4+3, and PSA of 8.12.   Intent: Curative     ==========DELIVERED PLANS==========  First Treatment Date: 2023-10-05 Last Treatment Date: 2023-11-17   Plan Name: Prostate Site: Prostate Technique: IMRT Mode: Photon Dose Per Fraction: 2.5 Gy Prescribed Dose (Delivered / Prescribed): 70 Gy / 70 Gy Prescribed Fxs (Delivered / Prescribed): 28 / 28     ==========ON TREATMENT VISIT DATES========== 2023-10-06, 2023-10-12, 2023-10-19, 2023-10-26, 2023-11-02, 2023-11-14, 2023-11-17    See weekly On Treatment Notes in Epic for details in the Media tab (listed as Progress notes on the On Treatment Visit Dates listed above).   The patient will receive a call in about one month from the radiation oncology department. He will continue follow up with his urologist, Dr. Cam, as well.  ------------------------------------------------   Donnice Barge, MD [facility]  Radiation Oncology Direct Dial: [phone]  Fax: [phone] [email or web address]  Skype  LinkedIn

## 2023-12-01 ENCOUNTER — Other Ambulatory Visit: Payer: Self-pay | Admitting: Urology

## 2023-12-01 DIAGNOSIS — C61 Malignant neoplasm of prostate: Secondary | ICD-10-CM

## 2023-12-19 ENCOUNTER — Ambulatory Visit
Admission: RE | Admit: 2023-12-19 | Discharge: 2023-12-19 | Disposition: A | Discharge status: Home / Self Care | Source: Ambulatory Visit | Attending: Radiation Oncology | Admitting: Radiation Oncology

## 2023-12-19 DIAGNOSIS — C61 Malignant neoplasm of prostate: Secondary | ICD-10-CM

## 2023-12-19 NOTE — Progress Notes (Signed)
  Radiation Oncology         [phone]) [phone] ________________________________  Name: Ivan Mccoy MRN: [id number]  Date of Service: 12/19/2023  DOB: Nov 05, 1935  Post Treatment Telephone Note  Diagnosis:  Stage T1c adenocarcinoma of the prostate with Gleason score of 4+3, and PSA of 8.12.    Pre Treatment IPSS Score: 19   The patient was available for call today.   Symptoms of fatigue have improved since completing therapy.  Symptoms of bladder changes have improved since completing therapy. Current symptoms include urinary frequency and nocturia x 2 (during to treatment x 4, and medications for bladder symptoms include Tamsulosin 0.4 mg po daily.  Symptoms of bowel changes have improved since completing therapy.  No current symptoms to reports having regular BM's no diarrhea/constipation.  Post Treatment IPSS Score:  3  IPSS Questionnaire (AUA-7): Over the past month.   1)  How often have you had a sensation of not emptying your bladder completely after you finish urinating?  0 - Not at all  2)  How often have you had to urinate again less than two hours after you finished urinating? 0 - Not at all  3)  How often have you found you stopped and started again several times when you urinated?  0 - Not at all  4) How difficult have you found it to postpone urination?  1 - Less than 1 time in 5  5) How often have you had a weak urinary stream?  0 - Not at all  6) How often have you had to push or strain to begin urination?  0 - Not at all  7) How many times did you most typically get up to urinate from the time you went to bed until the time you got up in the morning?  2 - 2 times  Total score:  3 Which indicates mild symptoms  0-7 mildly symptomatic   8-19 moderately symptomatic   20-35 severely symptomatic   Patient had a scheduled follow up visit with his urologist, Dr. Cam, on 12/08/2023.  Reports lab PSA results were 2.1 good conversation with doctor is pleased with all the care  provided for him.  He was counseled that PSA levels will be drawn in the urology office, and was reassured that additional time is expected to improve bowel and bladder symptoms. He was encouraged to call back with concerns or questions regarding radiation.

## 2024-03-06 NOTE — Progress Notes (Signed)
 Cardiology Office Note   Date:  03/11/2024   ID:  Ivan Mccoy, DOB May 15, 1935, MRN [id number]  PCP:  [facility]  Cardiologist:   Pastor Sgro, MD   Chief Complaint  Patient presents with   Coronary Artery Disease   Atrial Fibrillation    History of Present Illness: Ivan Mccoy is a 88 y.o. male who is seen for follow up of CAD and paroxysmal Afib. He also needs clearance for urologic procedure.  He lives at [facility]. He has a history of atrial fibrillation status post ablation in September 2013.  He also has a history of sleep apnea and is compliant with CPAP therapy. Additional past medical history is significant for hypertension and hyperlipidemia. He had a Cardiolite exercise tolerance test 05/06/2011 which was negative for ischemia. Echocardiogram also in January 2013 showed normal left ventricular systolic function with an estimated ejection fraction of 50-55%. There was moderate left ventricular hypertrophy with diastolic dysfunction. The left atrium and right atrium were mildly enlarged. Moderate tricuspid regurgitation and mild pulmonary hypertension was noted at that time.  In May 2017 he presented here with complaints of dyspnea, cough, subjective fever and chills. His CXR showed evidence of bilateral pneumonia and potentially superimposed mild interstitial edema. BNP was abnormal at 1070. Troponin was elevated at 1.15. Ecg showed T wave inversions in V5-6. He was treated for CAP. He was noted to have some episodes of Afib. Echo showed normal EF with mild basal septal hypertrophy. Mild MR and TR. Mild pulmonary HTN. He did undergo left heart cath which revealed 3 vessel obstructive CAD with predominant branch vessel disease. This includes the first and second diagonal, PLOM and RV marginal branches of the RCA. The LCx lesion appeared to be the culprit vessel and was treated w/ DES to mid distal LCx. He was DC on Plavix  and Xarelto .   On subsequent follow  up with his cardiologist in [place] he was noted to have a more significant murmur and Echo showed anterior MV prolapse with moderate MR. TEE showed severe prolapse of the A2 scallop of the anterior leaflet with severe MR. He also had severe pulmonary HTN. Event monitor in 2017 showed NSR with PACs. No Afib. He was referred to [facility] and seen by Dr Alford. On 12/21/15 he had attempt at MV repair but ultimately required MV replacement with a #27 Carentier Edwards pericardial valve. Echo on November 03, 2017 showed EF 50% with moderate LVH. Moderate LAE. Normally functioning MV prosthesis. Mean gradient 3 mm Hg. Trivial MR. Mild AI and TR.   On follow up today he is doing well. Denies any chest pain or SOB. He has been diagnosed with prostate CA treated with RT. Has completed course. He reports he is active.   Past Medical History:  Diagnosis Date   Arrhythmia    paroxysmal afib   Arthritis    Cancer (HCC)    skin cancer   CHF (congestive heart failure) (HCC)    Coronary artery disease    Heart murmur    Hyperlipidemia    Hypertension    Macular degeneration    Myocardial infarction (HCC)    Obstructive sleep apnea    cpap   Pneumonia    hx of   Wet age-related macular degeneration of both eyes with inactive choroidal neovascularization (HCC)    followed by DR Rankin Burke    Past Surgical History:  Procedure Laterality Date   APPENDECTOMY     CARDIAC CATHETERIZATION  N/A 08/31/2015   Procedure: Left Heart Cath and Coronary Angiography;  Surgeon: Teja Costen M Erving Sassano, MD;  Location: [facility] INVASIVE CV LAB;  Service: Cardiovascular;  Laterality: N/A;   CARDIAC CATHETERIZATION N/A 08/31/2015   Procedure: Coronary Stent Intervention;  Surgeon: Mayana Irigoyen M Adebayo Ensminger, MD;  Location: [facility] INVASIVE CV LAB;  Service: Cardiovascular;  Laterality: N/A;   CORONARY STENT INTERVENTION     excision of squamous cell ca Right    cheek   GOLD SEED IMPLANT N/A 09/22/2023   Procedure: INSERTION, GOLD SEEDS;  Surgeon:  Cam Morene ORN, MD;  Location: [facility] OR;  Service: Urology;  Laterality: N/A;  GOLD SEED IMPLANTS AND SPACE OAR   HEMATOMA EVACUATION     in left leg - 2012   HERNIA REPAIR     meniscus tear surgery      MITRAL VALVE REPLACEMENT  12/21/2015   #27 Carpentier Edwards pericardial valve.    PROSTATE BIOPSY     SPACE OAR INSTILLATION N/A 09/22/2023   Procedure: INJECTION, HYDROGEL SPACER;  Surgeon: Cam Morene ORN, MD;  Location: [facility] OR;  Service: Urology;  Laterality: N/A;   TONSILLECTOMY AND ADENOIDECTOMY     TOTAL KNEE ARTHROPLASTY Left 07/11/2022   Procedure: TOTAL KNEE ARTHROPLASTY;  Surgeon: Melodi Lerner, MD;  Location: [facility] ORS;  Service: Orthopedics;  Laterality: Left;     Current Outpatient Medications  Medication Sig Dispense Refill   acetaminophen  (TYLENOL ) 500 MG tablet Take 500-1,000 mg by mouth as needed (pain.).     amLODipine  (NORVASC ) 10 MG tablet TAKE ONE (1) TABLET BY MOUTH EACH DAY 90 tablet 3   atorvastatin  (LIPITOR ) 40 MG tablet TAKE 1 TABLET BY MOUTH DAILY 90 tablet 3   carvedilol  (COREG ) 25 MG tablet TAKE ONE TABLET BY MOUTH TWICE DAILY 180 tablet 3   Cholecalciferol (VITAMIN D3) 25 MCG (1000 UT) CAPS Take 1,000 Units by mouth 3 (three) times daily.     EPINEPHrine  0.3 mg/0.3 mL IJ SOAJ injection Inject 0.3 mg into the muscle as needed for anaphylaxis. 2 each 2   eplerenone  (INSPRA ) 50 MG tablet TAKE ONE TABLET BY MOUTH EVERY DAY 90 tablet 3   fenofibrate  160 MG tablet TAKE ONE (1) TABLET BY MOUTH EVERY DAY 90 tablet 3   furosemide  (LASIX ) 20 MG tablet TAKE ONE (1) TABLET BY MOUTH EVERY DAY 90 tablet 3   GLUCOSAMINE HCL PO Take 1 capsule by mouth 3 (three) times daily.     iron polysaccharides (NIFEREX) 150 MG capsule Take 150 mg by mouth daily.     isosorbide  mononitrate (IMDUR ) 30 MG 24 hr tablet TAKE 1 TABLET BY MOUTH DAILY 90 tablet 3   metroNIDAZOLE (METROCREAM) 0.75 % cream Apply 1 application topically every morning. APPLY TO FACE     Multiple Vitamin  (MULTIVITAMIN WITH MINERALS) TABS tablet Take 1 tablet by mouth in the morning. Centrum Silver     Multiple Vitamins-Minerals (PRESERVISION AREDS) TABS Take 1 tablet by mouth in the morning and at bedtime.     potassium chloride  SA (KLOR-CON  M) 20 MEQ tablet TAKE 1 TABLET BY MOUTH DAILY 90 tablet 3   Probiotic Product (PROBIOTIC DAILY) CAPS Take 1 capsule by mouth at bedtime.     Psyllium (METAMUCIL FIBER PO) Take 1 Dose by mouth every morning. powder     tamsulosin (FLOMAX) 0.4 MG CAPS capsule Take 0.4 mg by mouth.     valsartan  (DIOVAN ) 320 MG tablet Take 1 tablet (320 mg total) by mouth daily. 90 tablet 3  XARELTO  20 MG TABS tablet TAKE 1 TABLET DAILY WITH SUPPER 90 tablet 3   No current facility-administered medications for this visit.    Allergies:   Other, Peanut allergen powder-dnfp, Peanut butter flavoring agent (non-screening), Peanut oil, Peanut-containing drug products, Sulfa antibiotics, Eliquis [apixaban], and Spironolactone     Social History:  The patient  reports that he has never smoked. He has never used smokeless tobacco. He reports current alcohol use. He reports that he does not use drugs.   Family History:  The patient's family history includes Heart attack in his father.    ROS:  Please see the history of present illness.   Otherwise, review of systems are positive for none.   All other systems are reviewed and negative.    PHYSICAL EXAM: VS:  BP (!) 132/56   Pulse 68   Ht 5' 9 (1.753 m)   Wt 192 lb (87.1 kg)   SpO2 97%   BMI 28.35 kg/m  , BMI Body mass index is 28.35 kg/m. GEN: Well nourished, well developed, in no acute distress  HEENT: normal  Neck: no JVD, carotid bruits, or masses Cardiac: RRR; soft 1/6 systolic murmur LSB/apex, no rubs, or gallops,no edema  Respiratory:  clear to auscultation bilaterally, normal work of breathing GI: soft, nontender, nondistended, + BS MS: no deformity or atrophy  Skin: warm and dry, no rash Neuro:  Strength and  sensation are intact Psych: euthymic mood, full affect             Recent Labs: 09/22/2023: BUN 27; Creatinine, Ser 1.16; Potassium 3.9; Sodium 140    Lipid Panel No results found for: CHOL, TRIG, HDL, CHOLHDL, VLDL, LDLCALC, LDLDIRECT   Dated 01/15/19: LDL 43, triglycerides 81, TSH normal.  Dated 05/22/20: cholesterol 93, triglycerides 86, HDL 34, LDL 42. GFR 59.  Dated 05/27/21: cholesterol 112, triglycerides 104, HDL 38, NonHDL 74.  Dated 06/26/23: cholesterol 89, triglycerides 71, HDL 36, LDL 38, A1c 5.5%. CMET and CBC OK.  Dated 11/15/23: cholesterol 97, triglycerides 108, HDL 34, LDL 44. A1c 5.7%. CMET and CBC normal  Cardiac Catheterization: 08/31/2015    Dist RCA lesion, 100% stenosed. RPDA lesion, 40% stenosed. Acute Mrg lesion, 99% stenosed. Ost 1st Diag lesion, 80% stenosed. Ost 2nd Diag to 2nd Diag lesion, 90% stenosed. There is mild left ventricular systolic dysfunction. Prox Cx to Mid Cx lesion, 99% stenosed. Post intervention, there is a 0% residual stenosis.   1. 3 vessel obstructive CAD with predominant branch vessel disease. This includes the first and second diagonal, PLOM and RV marginal branches of the RCA. The LCx lesion appears to be the culprit vessel 2. Mild LV dysfunction with elevated LV EDP 3. Successful stenting of the mid-distal LCx with DES.    Plan: DAPT with ASA and Plavix  for one month then discontinue ASA. Continue Plavix  for at least one year. Plan to resume Pradaxa  tomorrow. Patient needs additional IV diuresis. Continue beta blocker and add oral nitrate.   Echo 08/25/15:Study Conclusions   - Procedure narrative: Transthoracic echocardiography. Image   quality was adequate. The study was technically difficult. - Left ventricle: The cavity size was normal. There was mild focal   basal hypertrophy of the septum. Systolic function was normal.   Wall motion was normal; there were no regional wall motion   abnormalities. The study is  not technically sufficient to allow   evaluation of LV diastolic function. - Aortic valve: Transvalvular velocity was within the normal range.   There was no  stenosis. There was mild regurgitation. - Mitral valve: Calcified annulus. There was mild regurgitation. - Left atrium: The atrium was moderately dilated. - Right ventricle: The cavity size was normal. Wall thickness was   normal. Systolic function was normal. - Tricuspid valve: There was mild regurgitation. - Pulmonary arteries: Systolic pressure was moderately increased.   PA peak pressure: 49 mm Hg (S). - Inferior vena cava: The vessel was normal in size. The   respirophasic diameter changes were blunted (< 50%), consistent   with normal central venous pressure.  Echo 01/12/21: IMPRESSIONS     1. The mitral valve has been replaced with a 27 mm Carpenteir Edwards  Pericardial Valve.   2. Prothestic valve parameters: Pressure Halte time 106 ms, TVI 3, EOA  1.4 cm2, mean gradient 3. There is evidence of some patient prosthesis  mismatch without significant mitral gradients and stenosis. There is  evidence of some degree of pathologic  regurgitation not well visualized by color Doppler. In the setting of  multiple valve disease (aortic, pulmonic, mitral), consider TEE for  further assessment, or earlier screening TTE follow up.   3. Pulmonic valve regurgitation is moderate.   4. Left ventricular ejection fraction, by estimation, is 55 to 60%. Left  ventricular ejection fraction by 3D volume is 55 %. The left ventricle has  normal function. The left ventricle has no regional wall motion  abnormalities. Left ventricular diastolic   parameters are indeterminate.   5. Right ventricular systolic function is normal. The right ventricular  size is normal. There is normal pulmonary artery systolic pressure.   6. Left atrial size was moderately dilated.   7. The aortic valve was not well visualized. Aortic valve regurgitation  is mild.  No aortic stenosis is present.   Comparison(s): A prior study was performed on 08/25/2015. New mitral valve  prosthesis.   ASSESSMENT AND PLAN:  1.  CAD. S/p  NSTEMI in setting of CAP in May 2017. S/p DES of mid to distal LCx. Branch vessel disease as noted. Continue antianginal therapy with nitrates, Coreg , amlodipine . No antiplatelet therapy since on Xarelto . He is  asymptomatic.   2. Paroxysmal Afib. No recurrence documented since his valve surgery in 2017. He is at higher risk for recurrence. CHAD Vasc score of 4. Continue Xarelto    3. History of MVP with severe MR. S/p MV replacement in 2017 with pericardial valve. Echo in Nov 2022 showed some MR mild to moderate.  SBE prophylaxis.   4. Hypercholesterolemia. Excellent control. LDL 44  5. HTN- currently well controlled on multiple meds.   6. Chronic diastolic CHF. Well compensated.   7. OSA on CPAP  8. Prostate CA s/p RT   Current medicines are reviewed at length with the patient today.  The patient does not have concerns regarding medicines.  The following changes have been made:  no change  Labs/ tests ordered today include:   No orders of the defined types were placed in this encounter.   Disposition:   FU with me in 6 months  Signed, Kahlil Cowans, MD  03/11/2024 2:40 PM    [facility] [address], [place], [place], [address] Phone [phone], Fax [phone]

## 2024-03-11 ENCOUNTER — Ambulatory Visit: Attending: Cardiology | Admitting: Cardiology

## 2024-03-11 ENCOUNTER — Encounter: Payer: Self-pay | Admitting: Cardiology

## 2024-03-11 VITALS — BP 132/56 | HR 68 | Ht 69.0 in | Wt 192.0 lb

## 2024-03-11 DIAGNOSIS — I10 Essential (primary) hypertension: Secondary | ICD-10-CM | POA: Insufficient documentation

## 2024-03-11 DIAGNOSIS — E7800 Pure hypercholesterolemia, unspecified: Secondary | ICD-10-CM | POA: Insufficient documentation

## 2024-03-11 DIAGNOSIS — I480 Paroxysmal atrial fibrillation: Secondary | ICD-10-CM | POA: Diagnosis present

## 2024-03-11 DIAGNOSIS — Z952 Presence of prosthetic heart valve: Secondary | ICD-10-CM | POA: Diagnosis present

## 2024-03-11 DIAGNOSIS — I25118 Atherosclerotic heart disease of native coronary artery with other forms of angina pectoris: Secondary | ICD-10-CM | POA: Insufficient documentation

## 2024-03-11 NOTE — Patient Instructions (Signed)
 Medication Instructions:  Continue same medications *If you need a refill on your cardiac medications before your next appointment, please call your pharmacy*  Lab Work: None ordered  Testing/Procedures: None ordered  Follow-Up: At [facility], you and your health needs are our priority.  As part of our continuing mission to provide you with exceptional heart care, our providers are all part of one team.  This team includes your primary Cardiologist (physician) and Advanced Practice Providers or APPs (Physician Assistants and Nurse Practitioners) who all work together to provide you with the care you need, when you need it.  Your next appointment:  6 months  Call in March to schedule June appointment     Provider:  Dr.Jordan   We recommend signing up for the patient portal called MyChart.  Sign up information is provided on this After Visit Summary.  MyChart is used to connect with patients for Virtual Visits (Telemedicine).  Patients are able to view lab/test results, encounter notes, upcoming appointments, etc.  Non-urgent messages can be sent to your provider as well.   To learn more about what you can do with MyChart, go to [email or web address].

## 2024-03-12 ENCOUNTER — Other Ambulatory Visit: Payer: Self-pay | Admitting: Cardiology

## 2024-03-25 ENCOUNTER — Encounter: Payer: Self-pay | Admitting: *Deleted

## 2024-03-28 ENCOUNTER — Other Ambulatory Visit: Payer: Self-pay | Admitting: Cardiology

## 2024-04-24 ENCOUNTER — Other Ambulatory Visit: Payer: Self-pay | Admitting: Cardiology

## 2024-05-02 ENCOUNTER — Encounter: Payer: Self-pay | Admitting: *Deleted

## 2024-05-02 NOTE — Progress Notes (Signed)
 SCP completed and mailed to pt.

## 2024-05-03 ENCOUNTER — Encounter: Payer: Self-pay | Admitting: *Deleted

## 2024-05-03 ENCOUNTER — Inpatient Hospital Stay: Attending: Adult Health | Admitting: *Deleted

## 2024-05-03 DIAGNOSIS — C61 Malignant neoplasm of prostate: Secondary | ICD-10-CM

## 2024-05-03 NOTE — Progress Notes (Addendum)
 SCP reviewed and completed. 2 Identifiers used for verification purposes for telephone visit. Allergies and medications reviewed and updated. Vaccines updated  Pt denies pain today. Pt says he is doing very well, has no fatigue. Pt will see Dr. Cam on Feb.2 following PSA labs. Pt is looking forward to upcoming trip to [place]. Pt was very complimentary of the care he received here at [facility]. A copy of SCP summary sent to PCP.
